# Patient Record
Sex: Male | Born: 1940 | Race: White | Hispanic: No | Marital: Married | Smoking: Former smoker
Health system: Southern US, Community
[De-identification: ages and names within clinical notes are randomized; demographics above are authoritative.]

## PROBLEM LIST (undated history)

## (undated) DIAGNOSIS — I340 Nonrheumatic mitral (valve) insufficiency: Secondary | ICD-10-CM

## (undated) DIAGNOSIS — I10 Essential (primary) hypertension: Secondary | ICD-10-CM

## (undated) DIAGNOSIS — E559 Vitamin D deficiency, unspecified: Secondary | ICD-10-CM

## (undated) DIAGNOSIS — K449 Diaphragmatic hernia without obstruction or gangrene: Secondary | ICD-10-CM

## (undated) DIAGNOSIS — R001 Bradycardia, unspecified: Secondary | ICD-10-CM

## (undated) DIAGNOSIS — Z85028 Personal history of other malignant neoplasm of stomach: Secondary | ICD-10-CM

## (undated) DIAGNOSIS — R42 Dizziness and giddiness: Secondary | ICD-10-CM

## (undated) DIAGNOSIS — K259 Gastric ulcer, unspecified as acute or chronic, without hemorrhage or perforation: Secondary | ICD-10-CM

## (undated) DIAGNOSIS — N529 Male erectile dysfunction, unspecified: Secondary | ICD-10-CM

## (undated) DIAGNOSIS — M8580 Other specified disorders of bone density and structure, unspecified site: Secondary | ICD-10-CM

## (undated) DIAGNOSIS — J449 Chronic obstructive pulmonary disease, unspecified: Secondary | ICD-10-CM

## (undated) DIAGNOSIS — C801 Malignant (primary) neoplasm, unspecified: Secondary | ICD-10-CM

## (undated) DIAGNOSIS — K219 Gastro-esophageal reflux disease without esophagitis: Secondary | ICD-10-CM

## (undated) HISTORY — DX: Gastro-esophageal reflux disease without esophagitis: K21.9

## (undated) HISTORY — DX: Chronic obstructive pulmonary disease, unspecified: J44.9

## (undated) HISTORY — DX: Diaphragmatic hernia without obstruction or gangrene: K44.9

## (undated) HISTORY — DX: Vitamin D deficiency, unspecified: E55.9

## (undated) HISTORY — DX: Dizziness and giddiness: R42

## (undated) HISTORY — DX: Gastric ulcer, unspecified as acute or chronic, without hemorrhage or perforation: K25.9

## (undated) HISTORY — DX: Male erectile dysfunction, unspecified: N52.9

## (undated) HISTORY — DX: Personal history of other malignant neoplasm of stomach: Z85.028

## (undated) HISTORY — DX: Essential (primary) hypertension: I10

## (undated) HISTORY — DX: Other specified disorders of bone density and structure, unspecified site: M85.80

## (undated) HISTORY — DX: Nonrheumatic mitral (valve) insufficiency: I34.0

## (undated) HISTORY — DX: Malignant (primary) neoplasm, unspecified: C80.1

## (undated) HISTORY — DX: Bradycardia, unspecified: R00.1

---

## 1993-06-21 HISTORY — PX: CHOLECYSTECTOMY: SHX55

## 1998-01-21 ENCOUNTER — Ambulatory Visit (HOSPITAL_COMMUNITY): Admission: RE | Admit: 1998-01-21 | Discharge: 1998-01-21 | Payer: Self-pay | Admitting: Gastroenterology

## 1999-09-01 ENCOUNTER — Ambulatory Visit (HOSPITAL_COMMUNITY): Admission: RE | Admit: 1999-09-01 | Discharge: 1999-09-01 | Payer: Self-pay | Admitting: Family Medicine

## 1999-09-01 ENCOUNTER — Encounter: Payer: Self-pay | Admitting: Family Medicine

## 1999-09-20 HISTORY — PX: OTHER SURGICAL HISTORY: SHX169

## 2002-08-14 ENCOUNTER — Encounter: Payer: Self-pay | Admitting: Emergency Medicine

## 2002-08-14 ENCOUNTER — Emergency Department (HOSPITAL_COMMUNITY): Admission: EM | Admit: 2002-08-14 | Discharge: 2002-08-14 | Payer: Self-pay | Admitting: Emergency Medicine

## 2002-08-17 ENCOUNTER — Ambulatory Visit (HOSPITAL_BASED_OUTPATIENT_CLINIC_OR_DEPARTMENT_OTHER): Admission: RE | Admit: 2002-08-17 | Discharge: 2002-08-17 | Payer: Self-pay | Admitting: Orthopedic Surgery

## 2005-05-27 ENCOUNTER — Encounter
Admission: RE | Admit: 2005-05-27 | Discharge: 2005-07-08 | Payer: Self-pay | Admitting: Physical Medicine and Rehabilitation

## 2006-01-05 ENCOUNTER — Ambulatory Visit: Payer: Self-pay | Admitting: Internal Medicine

## 2006-01-31 ENCOUNTER — Encounter (INDEPENDENT_AMBULATORY_CARE_PROVIDER_SITE_OTHER): Payer: Self-pay | Admitting: *Deleted

## 2006-01-31 ENCOUNTER — Ambulatory Visit: Payer: Self-pay | Admitting: Internal Medicine

## 2006-01-31 ENCOUNTER — Ambulatory Visit (HOSPITAL_COMMUNITY): Admission: RE | Admit: 2006-01-31 | Discharge: 2006-01-31 | Payer: Self-pay | Admitting: *Deleted

## 2006-03-07 ENCOUNTER — Ambulatory Visit (HOSPITAL_COMMUNITY): Admission: RE | Admit: 2006-03-07 | Discharge: 2006-03-07 | Payer: Self-pay | Admitting: Internal Medicine

## 2006-06-10 ENCOUNTER — Ambulatory Visit (HOSPITAL_COMMUNITY): Admission: RE | Admit: 2006-06-10 | Discharge: 2006-06-10 | Payer: Self-pay

## 2006-08-21 LAB — HM COLONOSCOPY

## 2009-07-17 ENCOUNTER — Encounter: Payer: Self-pay | Admitting: Cardiology

## 2009-07-21 ENCOUNTER — Ambulatory Visit: Payer: Self-pay | Admitting: Cardiology

## 2009-07-21 DIAGNOSIS — I10 Essential (primary) hypertension: Secondary | ICD-10-CM | POA: Insufficient documentation

## 2009-07-21 DIAGNOSIS — R0602 Shortness of breath: Secondary | ICD-10-CM

## 2009-07-21 DIAGNOSIS — R001 Bradycardia, unspecified: Secondary | ICD-10-CM | POA: Insufficient documentation

## 2009-08-04 ENCOUNTER — Encounter: Payer: Self-pay | Admitting: Cardiology

## 2009-08-04 ENCOUNTER — Telehealth: Payer: Self-pay | Admitting: Cardiology

## 2009-08-05 ENCOUNTER — Encounter: Payer: Self-pay | Admitting: Cardiology

## 2009-08-05 ENCOUNTER — Ambulatory Visit: Payer: Self-pay | Admitting: Cardiology

## 2009-08-05 ENCOUNTER — Ambulatory Visit: Payer: Self-pay

## 2009-08-05 ENCOUNTER — Ambulatory Visit (HOSPITAL_COMMUNITY): Admission: RE | Admit: 2009-08-05 | Discharge: 2009-08-05 | Payer: Self-pay | Admitting: Cardiology

## 2009-08-18 ENCOUNTER — Encounter: Payer: Self-pay | Admitting: Cardiology

## 2009-08-19 ENCOUNTER — Ambulatory Visit: Payer: Self-pay | Admitting: Cardiology

## 2009-08-21 ENCOUNTER — Telehealth: Payer: Self-pay | Admitting: Cardiology

## 2009-09-02 ENCOUNTER — Telehealth: Payer: Self-pay | Admitting: Cardiology

## 2009-09-03 ENCOUNTER — Encounter: Payer: Self-pay | Admitting: Cardiology

## 2010-07-21 NOTE — Assessment & Plan Note (Signed)
Summary: per check out/saf   Visit Type:  Follow-up Primary Provider:  Dr Koleen Nimrod  CC:  no cardiac complains.  History of Present Illness: 70 yo with history of COPD and bradycardia returns for followup.  Patient has been told that his heart rate was slow as long ago as 2001 when he was hospitalized for stomach cancer surgery.  At that time, they told him his heart rate went down to 40 at night.  Patient was seen recently by Paulita Cradle at [facility].  ECG at that time showed sinus bradycardia at about 40 bpm (readout says junctional bradycardia but I can see P waves).  Today, HR is 53.  Patient has never had lightheadedness/orthostasis or syncope.  He does get vertigo-type symptoms occasionally that resolve with meclizine.  He is very active.  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  No chest pain.  No exertional shortness of breath with his normal daily activities.  He gets some mild shortness of breath walking up a steep hill but this has been chronic for years and attributed to his COPD.  BP is high today but has been ok at home.  He has GERD that manifests as belching and acid taste in his mouth.   We did a holter monitor last month that showed heart rate ranging from 41-125, average 65, with no pauses.  Echo was done showing normal LV systolic function but mild to moderate RV dilation and mild to moderate RV dysfunction.  This was thought to be related to his COPD.  We did check his oxygen saturation at rest (98%) and with ambulation (93%).  Patient brings home blood pressures. Systolics range mainly in the 130s to about 150.  BP is 168/88 today.    Current Medications (verified): 1)  Androgel Pump 1 % Gel (Testosterone) .... Daily 2)  Multivitamins   Tabs (Multiple Vitamin) .... Daily 3)  Calcium Carbonate-Vitamin D 600-400 Mg-Unit  Tabs (Calcium Carbonate-Vitamin D) .... Take 1 Tablet By  Mouth Two Times A Day 4)  Meclizine Hcl 12.5 Mg Tabs (Meclizine Hcl) .... As Needed 5)  Aspirin 81 Mg Tbec (Aspirin) .... Take One Tablet By Mouth Daily 6)  Flovent Hfa 44 Mcg/act Aero (Fluticasone Propionate  Hfa) .... Once A Day 7)  Dexilant 60 Mg Cpdr (Dexlansoprazole) .... As Needed 8)  Meloxicam 15 Mg Tabs (Meloxicam) .... As Needed  Allergies: 1)  ! * Z Pack 2)  ! * Pain Meds  Past History:  Past Medical History: 1. Bradycardia: Asymptomatic.  Holter (2/11) with PACs, PVCs, 1 run of 5 beats NSVT, HR range 41-125 with no pauses.  2. GERD 3. h/o stomach cancer, s/p surgery in 2001 4. COPD 5. vertigo 6. low testosterone 7. HTN 8. Echo (2/11): EF 60%, no regional wall motion abnormalities, mild MR, mild to moderate RV dilation, mild to moderate RV dysfunction.   Family History: Reviewed history from 07/21/2009 and no changes required. Mother with MI at 47, father with CVA  Social History: Reviewed history from 07/21/2009 and no changes required. Tobacco Use - Former. -quit 2000 Married, lives in [place] Retired farmer, does some carpentry still  Review of Systems       All systems reviewed and negative except as per HPI.   Vital Signs:  Patient profile:   70 year old male Height:      76  inches Weight:      171.50 pounds BMI:     24.01 Pulse rate:   52 / minute Pulse rhythm:   regular Resp:     18 per minute BP sitting:   168 / 88  (left arm) Cuff size:   large  Vitals Entered By: Vikki Ports (August 19, 2009 9:49 AM)  O2 Flow:  Room air CC: no cardiac complains Comments Sp02 98% resting and 93% walking Hear rate 66 after walking   Physical Exam  General:  Well developed, well nourished, in no acute distress. Neck:  Neck supple, no JVD. No masses, thyromegaly or abnormal cervical nodes. Lungs:  Clear bilaterally to auscultation and percussion. Heart:  Non-displaced PMI, chest non-tender; regular rate and rhythm, S1, S2 without murmurs, rubs or  gallops. Carotid upstroke normal, no bruit. Pedals normal pulses. No edema, no varicosities. Abdomen:  Bowel sounds positive; abdomen soft and non-tender without masses, organomegaly, or hernias noted. No hepatosplenomegaly. Extremities:  No clubbing or cyanosis. Neurologic:  Alert and oriented x 3. Psych:  Normal affect.   Impression & Recommendations:  Problem # 1:  BRADYCARDIA (ICD-427.89) Asymptomatic.  Has been present for years.  HR in the 40s at the lowest with no long pauses or anything else worrisome on holter.  Would avoid nodal blocking agents.   Problem # 2:  HYPERTENSION, UNSPECIFIED (ICD-401.9) BP is high. Start chlorthalidone 12.5 mg daily with 20 mEq KCl daily.  BMET in 2 wks and BP check.   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  This is most likely related to his COPD.  He is minimally symptomatic and is not hypoxic.  No specific therapy at this time.   Patient Instructions: 1)  Your physician has recommended you make the following change in your medication:  2)  Start Chlorthalidone 12.5mg  daily--this will be one-half of a 25mg  tablet 3)  Start KCL(potassium) daily 4)  Your physician recommends that you have lab work in: 2weeks--you can get this at [facility]--you have the order--please ask them to fax the result to Dr Alexas Basulto--[phone] 5)  Take and record your blood pressure---I will call you in 2 weeks to get the readings Luana Shu  6)  Your physician recommends that you schedule a follow-up appointment as needed with Dr Shirlee Latch. Prescriptions: POTASSIUM CHLORIDE CRYS CR 20 MEQ CR-TABS (POTASSIUM CHLORIDE CRYS CR) Take one tablet by mouth daily  #30 x 6   Entered by:   Katina Dung, RN, BSN   Authorized by:   Marca Ancona, MD   Signed by:   Katina Dung, RN, BSN on 08/19/2009   Method used:   Electronically to        [facility] (retail)       [address]       [place], [place]   [address]       Ph: [phone]       Fax: [phone]   RxID:   [id number] CHLORTHALIDONE 25 MG TABS (CHLORTHALIDONE) one- half tablet  daily  #30 x 6   Entered by:   Katina Dung, RN, BSN   Authorized by:   Marca Ancona, MD   Signed by:   Katina Dung, RN, BSN on 08/19/2009   Method used:   Electronically to        [facility]  [address] [facility] (retail)       [id number]       [place], [place]  [address]       Ph: [phone]       Fax: [phone]   RxID:   [id number]

## 2010-07-21 NOTE — Progress Notes (Signed)
Summary: At Home Vitals  At Home Vitals   Imported By: Marylou Mccoy 10/07/2009 12:34:51  _____________________________________________________________________  External Attachment:    Type:   Image     Comment:   External Document

## 2010-07-21 NOTE — Progress Notes (Signed)
Summary: MEDICATION MAKING PT FEEL NAUSEA   Phone Note Call from Patient Call back at Home Phone [phone]   Caller: Patient Summary of Call: PT TAKING KLOR-CON AND CHLORTHALID MAKING PT NAUSEA. Initial call taken by: Judie Grieve,  August 21, 2009 4:10 PM  Follow-up for Phone Call        Called patient...he states that he started Chlorthalidone and KCL yesterday and was nauseated for several hours after...and today the same thing happened but lasted longer. He states that he did eat prior to taking medication. Advised him to hold meds in the am and we will discuss intolerance to medication with Dr.Rut Betterton and call him back tomorrow. He agreed to this plan. Follow-up by: J REISS RN     Appended Document: MEDICATION MAKING PT FEEL NAUSEA  Try taking medications with meal.  If still symptomatic will change medications.   Appended Document: MEDICATION MAKING PT FEEL NAUSEA  discussed with pt--he states he has been taking with meals and is still nauseated--will forward to Dr Shirlee Latch for review  Appended Document: MEDICATION MAKING PT FEEL NAUSEA  reviewed with Dr Rodnisha Blomgren--recommended pt D/C Chlorthalidone and KCL and start Lisinopril 10mg  daily--be sure pt has BMP check in about 2 weeks--LMVM for pt  Appended Document: MEDICATION MAKING PT FEEL NAUSEA  LMVM  Appended Document: MEDICATION MAKING PT FEEL NAUSEA  talked with patient--he will D/C chlorthalidone and KCL and start Lisinopril 10mg  daily-pt know to get BMP checked in 2 weeks-he has the order   Clinical Lists Changes  Medications: Removed medication of CHLORTHALIDONE 25 MG TABS (CHLORTHALIDONE) one- half tablet  daily Removed medication of POTASSIUM CHLORIDE CRYS CR 20 MEQ CR-TABS (POTASSIUM CHLORIDE CRYS CR) Take one tablet by mouth daily Added new medication of LISINOPRIL 10 MG TABS (LISINOPRIL) one tablet daily - Signed Rx of LISINOPRIL 10 MG TABS (LISINOPRIL) one tablet daily;  #30 x 2;  Signed;  Entered by: Katina Dung, RN, BSN;  Authorized by: Marca Ancona, MD;  Method used: Electronically to [facility] [address], [place], [place], [place]  [address], Ph: [phone], Fax: [phone]    Prescriptions: LISINOPRIL 10 MG TABS (LISINOPRIL) one tablet daily  #30 x 2   Entered by:   Katina Dung, RN, BSN   Authorized by:   Marca Ancona, MD   Signed by:   Katina Dung, RN, BSN on 08/22/2009   Method used:   Electronically to        [facility] (retail)       [address]       [place], [place]  [address]       Ph: [phone]       Fax: [phone]   RxID:   [id number]

## 2010-07-21 NOTE — Procedures (Signed)
Summary: SUMMARY REPORT  SUMMARY REPORT   Imported By: Mirna Mires 08/11/2009 14:44:15  _____________________________________________________________________  External Attachment:    Type:   Image     Comment:   External Document

## 2010-07-21 NOTE — Assessment & Plan Note (Signed)
Summary: Patrick Novak/bradycardia/lightheaded   Visit Type:  Initial Consult Primary Provider:  Dr Koleen Nimrod  CC:  slow heart rate-pt does have dizziness pt he states he does not feel like he is going to pass out. Sob with exer.[name]  History of Present Illness: 70 yo with history of COPD and bradycardia presents for evaluation of bradycardia.  Patient states that he was actually told his heart rate was slow as long ago as 2001 when he was hospitalized for stomach cancer surgery.  At that time, they told him his heart rate went down to 40 at night.  Patient was seen recently by Paulita Cradle at [facility].  ECG at that time showed sinus bradycardia at about 40 bpm (readout says junctional bradycardia but I can see P waves).  Today, HR is 53.  Patient has never had lightheadedness/orthostasis or syncope.  He does get vertigo-type symptoms occasionally that resolve with meclizine.  He is very active.  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  No chest pain.  No exertional shortness of breath with his normal daily activities.  He gets some mild shortness of breath walking up a steep hill but this has been chronic for years and attributed to his COPD.  BP is high today but has been ok at home.  He has GERD that manifests as belching and acid taste in his mouth.   ECG: sinus brady at 53, LAFB  Current Medications (verified): 1)  Androgel Pump 1 % Gel (Testosterone) .... Daily 2)  Multivitamins   Tabs (Multiple Vitamin) .... Daily 3)  Calcum and Vit D 600/400mg  .... 1 Tab Two Times A Day 4)  Meclizine Hcl 25 Mg Tabs (Meclizine Hcl) .... As Needed 5)  Aspirin 81 Mg Tbec (Aspirin) .... Take One Tablet By Mouth Daily 6)  Flovent .... Unsure of Dose and Directions 7)  Dexilant .... Ussure of Dose and Directions  Allergies (verified): 1)  ! * Z Pack 2)  ! * Pain Meds  Past History:  Past Medical History: 1.  Bradycardia 2. GERD 3. h/o stomach cancer, s/p surgery in 2001 4. COPD 5. vertigo 6. low testosterone  Family History: Mother with MI at 43, father with CVA  Social History: Tobacco Use - Former. 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   Vital Signs:  Patient profile:   70 year old male Height:      71 inches Weight:      173 pounds Pulse rate:   53 / minute BP sitting:   154 / 74  (left arm)  Vitals Entered By: Burnett Kanaris, CNA (July 21, 2009 8:56 AM)  Physical Exam  General:  Well developed, well nourished, in no acute distress. Neck:  Neck supple, no JVD. No masses, thyromegaly or abnormal cervical nodes. Lungs:  Clear bilaterally to auscultation and percussion. Heart:  Non-displaced PMI, chest non-tender; regular rate and rhythm, S1, S2 without murmurs, rubs or gallops. Carotid upstroke normal, no bruit. Pedals normal pulses. No edema, no varicosities. Abdomen:  Bowel sounds positive; abdomen soft and non-tender without masses, organomegaly, or hernias noted. No hepatosplenomegaly. Extremities:  No clubbing or cyanosis. Neurologic:  Alert and oriented x 3. Psych:  Normal affect.   Impression & Recommendations:  Problem # 1:  BRADYCARDIA (ICD-427.89) Patient had sinus bradycardia in the 40s at PCP, in the 50s here today.  Has history of sinus bradycardia back to 2001 at least.  He has not had any symptoms attributable to bradycardia (no lightheadedness or syncope).  I will have him do a 48 hour holter monitor to make sure that he does not have any worrisome pauses.   Problem # 2:  DYSPNEA (ICD-786.05) Dyspnea with heavy exertion.  Probably COPD but will get an echo.   Problem # 3:  HYPERTENSION, UNSPECIFIED (ICD-401.9) Patient will check BP at home and we will call in 2 wks to see what it is running.   Patient should take ASA 81 mg daily.    Other Orders: Echocardiogram (Echo) Holter Monitor (Holter Monitor)  Patient Instructions: 1)  Your physician has requested that you have an echocardiogram.  Echocardiography is a painless test that uses sound waves to create images of your heart. It provides your doctor with information about the size and shape of your heart and how well your heart's chambers and valves are working.  This procedure takes approximately one hour. There are no restrictions for this procedure. 2)  Your physician has recommended that you wear a holter monitor.  Holter monitors are medical devices that record the heart's electrical activity. Doctors most often use these monitors to diagnose arrhythmias. Arrhythmias are problems with the speed or rhythm of the heartbeat. The monitor is a small, portable device. You can wear one while you do your normal daily activities. This is usually used to diagnose what is causing palpitations/syncope (passing out). 3)  Your physician has recommended you make the following change in your medication:   Start Aspirin 81mg  daily--this should be buffered or coated 4)  Take and record your blood pressure--I will call you in 2 weeks to get the readings Luana Shu [phone]  5)  Your physician recommends that you schedule a follow-up appointment in: 1 month with Dr Shirlee Latch

## 2010-07-21 NOTE — Letter (Signed)
Summary: [facility]  [facility]   Imported By: Kassie Mends 09/19/2009 09:59:47  _____________________________________________________________________  External Attachment:    Type:   Image     Comment:   External Document

## 2010-07-21 NOTE — Progress Notes (Signed)
Summary: B/P readings  Phone Note Outgoing Call   Call placed by: Katina Dung, RN, BSN,  September 02, 2009 3:01 PM Call placed to: Patient Summary of Call: B/P readings  Follow-up for Phone Call        discussed with wife 08/22/09 117/57 65 08/23/09 127/67 61 08/24/09 121/68 58 08/25/09 136/71 75 08/26/09 159/62 57 08/27/09 136/68 63  08/28/09 119/61 54 08/29/09 130/57 50 08/30/09 133/61 51 08/31/09 130/67 52 09/01/09 124/62 51 09/02/09 125/60 52 pt will have BMP done at [facility] 09-03-09 pt feels good will forward to Dr Shirlee Latch for review       Appended Document: B/P readings good bp continue current therapy  Appended Document: B/P readings-review 09-02-09//BMP 09-03-09 [facility] discussed with wife by telephone

## 2010-07-21 NOTE — Progress Notes (Signed)
Summary: Patient's at Home Vitals  Patient's at Home Vitals   Imported By: Marylou Mccoy 08/05/2009 09:48:52  _____________________________________________________________________  External Attachment:    Type:   Image     Comment:   External Document  Appended Document: Patient's at Home Vitals BP borderline elevated.  Will discuss when returns to office.   Appended Document: Patient's at Home Vitals discussed with pt--OV with Dr Shirlee Latch 08-19-09

## 2010-07-21 NOTE — Progress Notes (Signed)
Summary: B/P readings  Phone Note Outgoing Call   Call placed by: Katina Dung, RN, BSN,  August 04, 2009 3:29 PM Call placed to: Patient  Follow-up for Phone Call        B/P readings--talked with wife --pt coming for testing 08-05-09--she will bring a list of B/P readings she has taken since 07-22-09 Katina Dung, RN, BSN  August 04, 2009 3:33 PM   received B/P readings from wife--they will be scanned into EMR this morning Katina Dung, RN, BSN  August 05, 2009 9:18 AM   Additional Follow-up for Phone Call Additional follow up Details #1::        home readings scanned into EMR--wil review with Dr Shirlee Latch

## 2010-11-06 NOTE — Op Note (Signed)
NAME:  Patrick Novak, STOUDT                           ACCOUNT NO.:  [id number]   MEDICAL RECORD NO.:  [id number]                   PATIENT TYPE:  AMB   LOCATION:  DSC                                  FACILITY:  [facility]   PHYSICIAN:  Cindee Salt, M.D.                    DATE OF BIRTH:  [birth year]   DATE OF PROCEDURE:  08/17/2002  DATE OF DISCHARGE:                                 OPERATIVE REPORT   PREOPERATIVE DIAGNOSIS:  Saw injury to left middle and ring fingers.   POSTOPERATIVE DIAGNOSIS:  Saw injury to left middle and ring fingers.   OPERATIONS:  1. Repair of flexor digitorum superficialis profundus of middle and ring     fingers.  2. Repair of radial digital nerve of middle and ring fingers.  3. Repair of ulnar digital nerve of middle and ring fingers.  4. Repair of ulnar digital artery of middle and ring fingers.   SURGEON:  Cindee Salt, M.D.   ANESTHESIA:  General.   HISTORY:  The patient is a 70 year old male who suffered a saw injury to the  proximal phalanges of his middle and ring fingers.  He is admitted for  repairs.   DESCRIPTION OF PROCEDURE:  The patient was brought to the operating room and  general endotracheal intubation anesthesia carried without difficulty.  He  was prepped and draped using Betadine scrub and solution in the supine  position with the left arm free.  The wounds were opened.  The lacerations  inspected and debrided.  The radial digital arteries were intact.  The  radial digital nerves were cut.  The flexor superficialis and profundus were  cut in each finger along with the ulnar neurovascular bundles.  The repairs  were performed first to the middle and then to the ring fingers.  By  isolating the profundus tendon, the wounds were extended proximally over the  proximal phalanges, carried down through subcutaneous tissues.  The flexor  profundus was then delivered distally and pinned with a 25 gauge needle.  A  repair was then performed using a  modified Kessler using 3-0 Ti-Cron  sutures.  The superficialis was repaired after an epineural repair was  performed again using the 3-0 Ti-Cron.  The superficialis was in the chiasm  and repaired with horizontal mattress 3-0 Ti-Cron sutures.  The ring finger  was similarly repaired, both superficialis and profundus.  This allowed the  finger to lie in normal position.  The operative microscope was brought into  position.  The ends of the arteries and nerves were each sequentially done  from the radial side of the middle finger first.  The digital nerve was  repaired after cutting back to normal vesicles.  The repair was performed  with interrupted 9-0 nylon sutures aligning vesicles.  The digital artery  was then repaired on the ulnar aspect.  This was irrigated, dilated,  and  clot removed and repair performed with a back wall first technique with  interrupted 9-0 nylon sutures.  The digital nerve was then repaired after  again cutting back to normal vesicles with an epineural repair.  The radial  digital nerve on the middle was repaired with an epineural repair.  The  ulnar digital artery was repaired after cutting back to normal intima.  Irrigation, dilation, and a back wall first technique used.  The nerve was  then repaired with an epineural repair again using 9-0 nylon.  The wounds  were irrigated.  A digital block was given with 0.25% Marcaine to each  finger.  The wounds were closed with interrupted 5-0 nylon sutures.  A  sterile compressive dressing and splint were applied.  The patient tolerated  the procedure well.  The splint maintained the fingers in a flexed position  along with slight flexion of the wrist.  The patient was taken to the  recovery room for observation in satisfactory condition.  On deflation of  the tourniquet, the fingers immediately pinked.  He was discharged home to  return to the [facility] in one week on Talwin and Keflex.                                                Cindee Salt, M.D.    Angelique Blonder  D:  08/17/2002  T:  08/17/2002  Job:  [id number]

## 2010-11-06 NOTE — Op Note (Signed)
NAME:  Patrick Novak, Patrick Novak                 ACCOUNT NO.:  [id number]   MEDICAL RECORD NO.:  [id number]          PATIENT TYPE:  AMB   LOCATION:  DAY                           FACILITY:  [facility]   PHYSICIAN:  Lionel December, M.D.    DATE OF BIRTH:  [birth year]   DATE OF PROCEDURE:  01/31/2006  DATE OF DISCHARGE:                                 OPERATIVE REPORT   PROCEDURE:  Esophagogastroduodenoscopy followed by colonoscopy, which was  incomplete.   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  He now presents  with nausea of a few months' duration and recent episodes of hematochezia.  He is undergoing a diagnostic evaluation.  The patient recalls that his last  colonoscopy was in 1995 in [place], which was an incomplete exam,  followed by a barium enema.   Procedure and risks were reviewed the patient and informed consent was  obtained.   MEDICATIONS FOR CONSCIOUS SEDATION:  Benzocaine spray for pharyngeal topical  anesthesia, Demerol 50 mg IV, Versed 5 mg IV, atropine 0.3 mg IV.   FINDINGS:  Procedure performed in endoscopy suite.  The patient's vital  signs and O2 saturation were monitored during the procedure and remained  stable.  His blood pressure and O2 saturation remained normal.  He had  asymptomatic bradycardia with heart rate dropping to around 43, for which he  was given atropine.   PROCEDURE #1:  Esophagogastroduodenoscopy.   The patient was placed in the left lateral recumbent position and the  Olympus video scope was passed via oropharynx without any difficulty into  esophagus.   Esophagus:  The mucosa of the esophagus was normal.  GE junction was at 38  cm and hiatus at 40.   Stomach:  A fairly large pouch with a patent gastrojejunostomy.  Both the  afferent and efferent loops were examined and were normal.   Afferent loop  was a short with blunt ends with a patent gastrojejunostomy.  Mucosa of  gastric body, fundus and cardia was normal.  Scope was easily retroflexed to  examine fundus and cardia.   Small bowel:  Both afferent and efferent loops were examined and mucosa and  folds were normal.  Afferent loop was small (blind pouch).   Endoscope was withdrawn.  The patient prepared for procedure #2.   PROCEDURE #2:  Colonoscopy.   Rectal examination performed.  No abnormality noted on external or digital  exam.  The Olympus video scope was placed in the rectum and advanced under  vision into sigmoid colon.  Sigmoid colon was very noncompliant and  tortuous.  Scope could never be kept straight.  Using the loop, the scope  was passed into descending colon and splenic flexure.  The patient was  turned on his back and then on the right side and scope was passed into what  was felt to be the proximal to midtransverse colon.  However, loop could  never be completely reduced.  Therefore, examination  was incomplete.  Mucosa  of the distal half of the transverse colon, descending and sigmoid colon was  normal.  The rectal mucosa was normal except single a small polyp, which was  ablated via cold biopsy.  Scope was retroflexed to examine anorectal  junction and small hemorrhoids were noted below the dentate line.  Endoscope  was then withdrawn.  The patient tolerated the procedure well.   FINAL DIAGNOSES:  1. Small sliding hiatal hernia.  2. Patent gastrojejunostomy without any ulceration or stricture.  Large      gastric pouch without any abnormalities.  3. Incomplete colonoscopy to mid or proximal transverse colon.  He has      very noncompliant, tortuous sigmoid colon, limiting  exam.  4. Small rectal polyp, which was ablated via cold biopsy.  5. External hemorrhoids.   RECOMMENDATIONS:  1. He will continue with Carafate liquid, which has helped him a great      deal.  He will 2 g at bedtime  and 1 g as soon as he wakes up in the      morning.  2. I will be contacting the patient with biopsy results.  If he is      agreeable, we will bring him back in 3-4 weeks for a barium enema.      Lionel December, M.D.  Electronically Signed     NR/MEDQ  D:  01/31/2006  T:  02/01/2006  Job:  [id number]   cc:   Ernestina Penna, M.D.  Fax: [phone]

## 2010-11-06 NOTE — H&P (Signed)
[name], Patrick Novak                 ACCOUNT NO.:  [id number]   MEDICAL RECORD NO.:  [id number]          PATIENT TYPE:  AMB   LOCATION:                                FACILITY:  [facility]   PHYSICIAN:  Lionel December, M.D.    DATE OF BIRTH:  1941/02/13   DATE OF ADMISSION:  01/05/2006  DATE OF DISCHARGE:  LH                                HISTORY & PHYSICAL   PRESENTING COMPLAINT:  1.  Nausea of a few months duration.  2.  Progressive weakness.  3.  Hematochezia one week ago.   HISTORY OF PRESENT ILLNESS:  Patrick Novak is 70 year old Caucasian male patient of  Dr. Cherre Blanc who is here for a scheduled visit.  He is well known to me  from previous evaluation back in March 2001 when he was diagnosed with  malignant gastric ulcer.  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  He received six cycles of Cytoxan, Adriamycin,  vincristine, and prednisone, and has remained free of disease.  His last EGD  was unremarkable performed at [facility] in June 2003.  According to Dr. Hezzie Bump  note, his CT was three years ago.  He remains in remission.   The patient states that he has not felt well for the past few months.  On  most days, he wakes up with nausea.  He eats five to six small meals a day  and with some of these meals, he will start the heaving and gagging but has  not had a single episode of vomiting.  At times he has taken meclizine which  seemed to help.  However, he does not associate his nausea with HIGO.  He  denies abdominal pain, dysphagia, heartburn.  His appetite is fair.  He also  complains of progressive weakness over the last several months and he feels  tired all the time.  He has not had any fever, chills or night sweats.  He  also denies chest pain.  The only time he gets some dyspnea is if he goes up  a hill.  When asked if he was depressed, his wife states that sometimes he  does say  that he is depressed because some of the people he has known have  passed away.  He has also been having neck pain.  He had a shot back in  February which helps.  One week ago, he passed a moderate amount of bright  red blood with his bowel movement.  His stools generally have been soft  since his gastric surgery and he may have anywhere from one to three per day  and occasionally may skip a day.  He recalls his last colonoscopy was by Dr.  Matthias Hughs back in 1995.  From the description, it appears it was an incomplete  exam and he had a barium enema.  At that time, he also had EGD.   MEDICATIONS:  He is on meclizine 12.5 mg q.6h. p.r.n., Pepcid AC p.r.n.   He does  not take any OTC NSAIDs.   PAST MEDICAL HISTORY:  1.  History of gastric lymphoma, as above.  2.  History of labyrinthitis.  3.  He had cholecystectomy back in 1995.   ALLERGIES:  He is sensitive to narcotics.   FAMILY HISTORY:  Mother died of MI at age 60.  Father had many CVAs and died  at age 75.  He has five sisters in good health (ages 23-82).  He has one  brother living who is also in good health.  One brother died of CVA at age  88.   SOCIAL HISTORY:  He is married.  He has four children in good health.  He is  a retired Visual merchandiser.  He smoked 1 to 1 1/2 packs a day for several years but  quit six years ago.  He has never drank alcohol.   PHYSICAL EXAMINATION:  VITAL SIGNS:  Weight 171 pounds which is close to his weight in March 2001.  He is 5 feet 7 inches tall.  Pulse 60 per minute, blood pressure 120/70,  temperature is 98.  HEENT:  Conjunctivae is pink.  Sclerae is nonicteric.  Oral pharyngeal  mucosa is normal.  He has upper dentures in place and dentures in lower jaw.  NECK:  No neck masses or thyromegaly noted.  No axillary or inguinal  adenopathy, either.  No carotid bruits are noted.  CARDIAC:  Exam with regular rhythm.  Normal S1 and S2.  No murmur or gallop  noted.  LUNGS:  Clear to auscultation.   ABDOMEN:  His abdomen is flat.  Bowel sounds are normal on palpation.  It is  soft and nontender without organomegaly or masses.  RECTAL:  Examination  reveals non-nodular prostate and stool is guaiac negative.  EXTREMITIES:  No peripheral edema or clubbing noted.   LABORATORY DATA:  Labs from [facility] performed Nov 08, 2005, WBC 7.3, H&H 15.6  and 43.8, platelet count 345.  His LDH was 161.  PSA 0.7.  Sed rate was 4.  Thyroid panel from November 22, 2005, TSH 3.501, FTI 2.8, T4 9.9, and T3 uptake  is 28.  CBC and chemistry panel from April 05, 2005, was all normal.   ASSESSMENT:  Sargon is a 70 year old Caucasian male with history of gastric  lymphoma who is status post radical subtotal gastrectomy and six cycles of  chemotherapy who remains in remission who presents with a few months history  of nausea, postprandial gagging, progressive weakness, and a single episode  of hematochezia last week.  As far as his nausea is concerned, it could be  due to bile reflux, anastomotic stricture, or even central due to  depression.   Progressive weakness.  Recent lab studies have been normal including TSH and  LDH.  His H&H have also been normal.  So there is no obvious etiology.  B12  level needs to be checked given that he has had near total gastrectomy.   Recent episode of hematochezia.  This may well be hemorrhoidal, need to make  sure he does not have colonic neoplasm, his last colonoscopy was over ten  years ago.   RECOMMENDATIONS:  1.  Will check serum B12 level.  2.  Empiric trial with Carafate liquid 2 grams at bedtime and he can also      take 1 gram as soon as he wakes up.  3.  Diagnostic esophagogastroduodenoscopy followed by colonoscopy to be      performed at the [facility] in the near  future.  I have reviewed the procedure      and risks with the patient and his wife and they are agreeable.   Further recommendations will depend on results of these studies.     Lionel December, M.D.   Electronically Signed     NR/MEDQ  D:  01/05/2006  T:  01/05/2006  Job:  [id number]   cc:   Ernestina Penna, M.D.  Fax: [phone]   Jaclyn Prime. Greggory Stallion, M.D.  Fax: [phone]

## 2010-12-22 ENCOUNTER — Encounter: Payer: Self-pay | Admitting: Cardiology

## 2011-11-18 LAB — FECAL OCCULT BLOOD, GUAIAC: Fecal Occult Blood: NEGATIVE

## 2012-02-09 LAB — HM DEXA SCAN

## 2012-08-20 ENCOUNTER — Encounter: Payer: Self-pay | Admitting: Family Medicine

## 2012-08-20 DIAGNOSIS — K449 Diaphragmatic hernia without obstruction or gangrene: Secondary | ICD-10-CM

## 2012-08-20 DIAGNOSIS — M8580 Other specified disorders of bone density and structure, unspecified site: Secondary | ICD-10-CM | POA: Insufficient documentation

## 2012-08-20 DIAGNOSIS — N529 Male erectile dysfunction, unspecified: Secondary | ICD-10-CM

## 2012-08-20 DIAGNOSIS — R7989 Other specified abnormal findings of blood chemistry: Secondary | ICD-10-CM | POA: Insufficient documentation

## 2012-09-08 ENCOUNTER — Ambulatory Visit (INDEPENDENT_AMBULATORY_CARE_PROVIDER_SITE_OTHER): Payer: Medicare Other | Admitting: Family Medicine

## 2012-09-08 ENCOUNTER — Encounter: Payer: Self-pay | Admitting: Family Medicine

## 2012-09-08 VITALS — BP 128/65 | HR 58 | Temp 97.4°F | Ht 71.0 in | Wt 172.4 lb

## 2012-09-08 DIAGNOSIS — I10 Essential (primary) hypertension: Secondary | ICD-10-CM

## 2012-09-08 DIAGNOSIS — M8580 Other specified disorders of bone density and structure, unspecified site: Secondary | ICD-10-CM

## 2012-09-08 DIAGNOSIS — M899 Disorder of bone, unspecified: Secondary | ICD-10-CM

## 2012-09-08 DIAGNOSIS — E291 Testicular hypofunction: Secondary | ICD-10-CM

## 2012-09-08 DIAGNOSIS — Z5181 Encounter for therapeutic drug level monitoring: Secondary | ICD-10-CM

## 2012-09-08 DIAGNOSIS — R7989 Other specified abnormal findings of blood chemistry: Secondary | ICD-10-CM

## 2012-09-08 DIAGNOSIS — J441 Chronic obstructive pulmonary disease with (acute) exacerbation: Secondary | ICD-10-CM

## 2012-09-08 DIAGNOSIS — E559 Vitamin D deficiency, unspecified: Secondary | ICD-10-CM

## 2012-09-08 DIAGNOSIS — M949 Disorder of cartilage, unspecified: Secondary | ICD-10-CM

## 2012-09-08 LAB — POCT CBC
Granulocyte percent: 60.6 %G (ref 37–80)
HCT, POC: 43.5 % (ref 43.5–53.7)
Hemoglobin: 15.8 g/dL (ref 14.1–18.1)
Lymph, poc: 2.3 (ref 0.6–3.4)
MCH, POC: 33.4 pg — AB (ref 27–31.2)
MCHC: 36.3 g/dL — AB (ref 31.8–35.4)
MCV: 92 fL (ref 80–97)
POC Granulocyte: 4.1 (ref 2–6.9)
POC LYMPH PERCENT: 34.1 %L (ref 10–50)
Platelet Count, POC: 250 10*3/uL (ref 142–424)
RBC: 4.7 M/uL (ref 4.69–6.13)
RDW, POC: 13.8 %
WBC: 6.7 10*3/uL (ref 4.6–10.2)

## 2012-09-08 LAB — HEPATIC FUNCTION PANEL
ALT: 14 U/L (ref 0–53)
AST: 17 U/L (ref 0–37)
Albumin: 3.9 g/dL (ref 3.5–5.2)
Alkaline Phosphatase: 100 U/L (ref 39–117)
Bilirubin, Direct: 0.2 mg/dL (ref 0.0–0.3)
Indirect Bilirubin: 0.7 mg/dL (ref 0.0–0.9)
Total Bilirubin: 0.9 mg/dL (ref 0.3–1.2)
Total Protein: 5.8 g/dL — ABNORMAL LOW (ref 6.0–8.3)

## 2012-09-08 LAB — BASIC METABOLIC PANEL
BUN: 14 mg/dL (ref 6–23)
CO2: 28 mEq/L (ref 19–32)
Calcium: 9.2 mg/dL (ref 8.4–10.5)
Chloride: 105 mEq/L (ref 96–112)
Creat: 1.09 mg/dL (ref 0.50–1.35)
Glucose, Bld: 84 mg/dL (ref 70–99)
Potassium: 4.4 mEq/L (ref 3.5–5.3)
Sodium: 140 mEq/L (ref 135–145)

## 2012-09-08 LAB — PSA, MEDICARE: PSA: 1.33 ng/mL (ref <=4.00)

## 2012-09-08 LAB — TESTOSTERONE: Testosterone: 602 ng/dL (ref 300–890)

## 2012-09-08 MED ORDER — LISINOPRIL 10 MG PO TABS: 10.0000 mg | ORAL_TABLET | Freq: Every day | ORAL | Status: DC

## 2012-09-08 MED ORDER — FLUTICASONE PROPIONATE HFA 44 MCG/ACT IN AERO: 1.0000 | INHALATION_SPRAY | Freq: Every day | RESPIRATORY_TRACT | Status: DC

## 2012-09-08 MED ORDER — TESTOSTERONE 20.25 MG/1.25GM (1.62%) TD GEL: 2.0000 | Freq: Every morning | TRANSDERMAL | Status: DC

## 2012-09-08 NOTE — Progress Notes (Signed)
Subjective:     Patient ID: Patrick Novak, male   DOB: 15-Jun-1941, 72 y.o.   MRN: [id number]  HPI Patient comes in to establish with a new provider here at [facility]. He is to see Lupita Leash.  a multitude of medical problems which are all stable at present which include a crit past history of stomach cancer status post gastrectomy. Large cell lymphoma. Osteopenia. Hypogonadism/low testosterone Hiatal hernia. COPD. Vitamin D deficiency. Osteopenia. Erectile dysfunction.  Past Medical History  Diagnosis Date  . Bradycardia     asympotmatic. Holter (2/11) with PACs, 1 run of 5 beats NSVT, HR range 41-125 with no pauses.   [name] GERD (gastroesophageal reflux disease)   . History of stomach cancer     surgery in 2001  . COPD (chronic obstructive pulmonary disease)   . Vertigo   . Low testosterone   . HTN (hypertension)   . MR (mitral regurgitation)     echo 2/11: EF 60%, no regional wall abnormalities, mild MR, mild to moderate RV dialation, mild to moderate RV dysfunction   . Cancer     gastric lymphoma  . Osteopenia   . Vitamin D deficiency   . Hiatal hernia   . Gastric ulcer   . ED (erectile dysfunction)   . Vertigo    Past Surgical History  Procedure Laterality Date  . Cholecystectomy  1995  . Radical subtotal gastrectomy  09/1999   History   Social History  . Marital Status: Married    Spouse Name: N/A    Number of Children: N/A  . Years of Education: N/A   Occupational History  . Not on file.   Social History Main Topics  . Smoking status: Former Smoker -- 1.50 packs/day    Types: Cigarettes    Quit date: 08/21/1998  . Smokeless tobacco: Not on file     Comment: quit in 2000  . Alcohol Use: No  . Drug Use: No  . Sexually Active: Not on file   Other Topics Concern  . Not on file   Social History Narrative   Married, lives in [place].   Retired Visual merchandiser, does some carpentry.    Family History  Problem Relation Age of Onset  . Stroke Father   . Heart disease  Mother   . Stroke Brother    Current Outpatient Prescriptions on File Prior to Visit  Medication Sig Dispense Refill  . aspirin 81 MG EC tablet Take 81 mg by mouth daily.        . Calcium Carbonate-Vitamin D 600-400 MG-UNIT per tablet Take 1 tablet by mouth 2 (two) times daily.        . meclizine (ANTIVERT) 12.5 MG tablet Take 12.5 mg by mouth as needed.        . meloxicam (MOBIC) 15 MG tablet Take 15 mg by mouth as needed.        . Multiple Vitamin (MULTIVITAMIN) tablet Take 1 tablet by mouth daily.        [name] dexlansoprazole (DEXILANT) 60 MG capsule Take 60 mg by mouth as needed.         No current facility-administered medications on file prior to visit.   Allergies  Allergen Reactions  . Ciprofloxacin Nausea Only  . Codeine   . Zithromax (Azithromycin)    Immunization History  Administered Date(s) Administered  . DTaP 07/23/2002  . Influenza Whole 03/22/2012  . Pneumococcal Polysaccharide 08/21/2006  . Zoster 08/20/2009   Prior to Admission medications   Medication  Sig Start Date End Date Taking? Authorizing Provider  aspirin 81 MG EC tablet Take 81 mg by mouth daily.     Yes Historical Provider, MD  Calcium Carbonate-Vitamin D 600-400 MG-UNIT per tablet Take 1 tablet by mouth 2 (two) times daily.     Yes Historical Provider, MD  fluticasone (FLOVENT HFA) 44 MCG/ACT inhaler Inhale 1 puff into the lungs daily. 09/08/12  Yes Ileana Ladd, MD  lisinopril (PRINIVIL,ZESTRIL) 10 MG tablet Take 1 tablet (10 mg total) by mouth daily. 09/08/12  Yes Ileana Ladd, MD  meclizine (ANTIVERT) 12.5 MG tablet Take 12.5 mg by mouth as needed.     Yes Historical Provider, MD  meloxicam (MOBIC) 15 MG tablet Take 15 mg by mouth as needed.     Yes Historical Provider, MD  Multiple Vitamin (MULTIVITAMIN) tablet Take 1 tablet by mouth daily.     Yes Historical Provider, MD  PROAIR HFA 108 (90 BASE) MCG/ACT inhaler  07/04/12  Yes Historical Provider, MD  dexlansoprazole (DEXILANT) 60 MG capsule Take  60 mg by mouth as needed.      Historical Provider, MD  promethazine (PHENERGAN) 12.5 MG tablet  07/17/12   Historical Provider, MD  Testosterone (ANDROGEL) 20.25 MG/1.25GM (1.62%) GEL Place 2 Squirts onto the skin every morning. 09/08/12   Ileana Ladd, MD     Review of Systems    systematic review was negative today Objective:   Physical Exam On examination he appeared in no acute distress. BP 128/65  Pulse 58  Temp(Src) 97.4 F (36.3 C) (Oral)  Ht 5\' 11"  (1.803 m)  Wt 172 lb 6.4 oz (78.2 kg)  BMI 24.06 kg/m2  Vital signs as documented.  Skin warm and dry and without overt rashes.  Head &Neck without JVD. Normal. No carotid bruits Lungs clear.  Heart exam notable for regular rhythm, normal sounds and absence of murmurs, rubs or gallops.  Abdomen unremarkable and without evidence of organomegaly, masses, or abdominal aortic enlargement. Midline abdominal scar from remote surgery Extremities nonedematous.    Assessment:     Osteopenia  Low testosterone - Plan: Testosterone  Unspecified vitamin D deficiency - Plan: Vitamin D, 25-hydroxy  Unspecified essential hypertension - Plan: Basic Metabolic Panel, POCT CBC  COPD exacerbation  Medication monitoring encounter - Plan: Hepatic Function Panel, PSA, Medicare, CANCELED: CBC with Differential  Today's blood pressure is well controlled. He is actually feeling better well and he came in for routine labs to follow on his medications PSA has been ordered due to the fact that these on testosterone he would like to go back to the AndroGel and stopped using the injection    Plan:     Orders Placed This Encounter  Procedures  . Hepatic Function Panel  . Basic Metabolic Panel  . Testosterone  . Vitamin D, 25-hydroxy  . PSA, Medicare  . POCT CBC   Diet and Exercise discussed with patient. For nutrition information, I recommended books: Eat to Live by Dr Monico Hoar. Prevent and Reverse Heart Disease by Dr Suzzette Righter.  Exercise recommendations are:  If unable to walk, then the patient can exercise in a chair 3 times a day. By flapping arms like a bird gently and raising legs outwards to the front.  If ambulatory, the patient can go for walks for 30 minutes 3 times a week. Then increase the intensity and duration as tolerated. Goal is to try to attain exercise frequency to 5 times a week. Best to perform  resistance exercises 2 days a week and cardio type exercises 3 days per week.  Remy Voiles P. Modesto Charon, M.D.

## 2012-09-09 LAB — VITAMIN D 25 HYDROXY (VIT D DEFICIENCY, FRACTURES): Vit D, 25-Hydroxy: 48 ng/mL (ref 30–89)

## 2012-09-11 ENCOUNTER — Other Ambulatory Visit: Payer: Self-pay

## 2012-09-11 MED ORDER — TESTOSTERONE 20.25 MG/1.25GM (1.62%) TD GEL: 1.0000 | Freq: Every morning | TRANSDERMAL | Status: DC

## 2012-09-11 NOTE — Progress Notes (Signed)
Quick Note:  Call patient. Labs normal. No change in plan. However we can reduce AndroGel pump to 1 squirt per day ______

## 2012-09-13 ENCOUNTER — Ambulatory Visit (INDEPENDENT_AMBULATORY_CARE_PROVIDER_SITE_OTHER): Payer: Medicare Other | Admitting: *Deleted

## 2012-09-13 DIAGNOSIS — E291 Testicular hypofunction: Secondary | ICD-10-CM

## 2012-09-13 DIAGNOSIS — R7989 Other specified abnormal findings of blood chemistry: Secondary | ICD-10-CM

## 2012-09-13 MED ORDER — TESTOSTERONE CYPIONATE 200 MG/ML IM SOLN
200.0000 mg | INTRAMUSCULAR | Status: DC
  Administered 2012-09-13: 200 mg via INTRAMUSCULAR

## 2012-09-13 NOTE — Patient Instructions (Signed)
Testosterone injection What is this medicine? TESTOSTERONE (tes TOS ter one) is the main male hormone. It supports normal male development such as muscle growth, facial hair, and deep voice. It is used in males to treat low testosterone levels. This medicine may be used for other purposes; ask your health care provider or pharmacist if you have questions. What should I tell my health care provider before I take this medicine? They need to know if you have any of these conditions: -breast cancer -diabetes -heart disease -kidney disease -liver disease -lung disease -prostate cancer, enlargement -an unusual or allergic reaction to testosterone, other medicines, foods, dyes, or preservatives -pregnant or trying to get pregnant -breast-feeding How should I use this medicine? This medicine is for injection into a muscle. It is usually given by a health care professional in a hospital or clinic setting. Contact your pediatrician regarding the use of this medicine in children. While this medicine may be prescribed for children as young as 12 years of age for selected conditions, precautions do apply. Overdosage: If you think you have taken too much of this medicine contact a poison control center or emergency room at once. NOTE: This medicine is only for you. Do not share this medicine with others. What if I miss a dose? Try not to miss a dose. Your doctor or health care professional will tell you when your next injection is due. Notify the office if you are unable to keep an appointment. What may interact with this medicine? -medicines for diabetes -medicines that treat or prevent blood clots like warfarin -oxyphenbutazone -propranolol -steroid medicines like prednisone or cortisone This list may not describe all possible interactions. Give your health care provider a list of all the medicines, herbs, non-prescription drugs, or dietary supplements you use. Also tell them if you smoke, drink  alcohol, or use illegal drugs. Some items may interact with your medicine. What should I watch for while using this medicine? Visit your doctor or health care professional for regular checks on your progress. They will need to check the level of testosterone in your blood. This medicine may affect blood sugar levels. If you have diabetes, check with your doctor or health care professional before you change your diet or the dose of your diabetic medicine. This drug is banned from use in athletes by most athletic organizations. What side effects may I notice from receiving this medicine? Side effects that you should report to your doctor or health care professional as soon as possible: -allergic reactions like skin rash, itching or hives, swelling of the face, lips, or tongue -breast enlargement -breathing problems -changes in mood, especially anger, depression, or rage -dark urine -general ill feeling or flu-like symptoms -light-colored stools -loss of appetite, nausea -nausea, vomiting -right upper belly pain -stomach pain -swelling of ankles -too frequent or persistent erections -trouble passing urine or change in the amount of urine -unusually weak or tired -yellowing of the eyes or skin Additional side effects that can occur in women include: -deep or hoarse voice -facial hair growth -irregular menstrual periods Side effects that usually do not require medical attention (report to your doctor or health care professional if they continue or are bothersome): -acne -change in sex drive or performance -hair loss -headache This list may not describe all possible side effects. Call your doctor for medical advice about side effects. You may report side effects to FDA at 1-800-FDA-1088. Where should I keep my medicine? Keep out of the reach of children. This medicine   can be abused. Keep your medicine in a safe place to protect it from theft. Do not share this medicine with anyone.  Selling or giving away this medicine is dangerous and against the law. Store at room temperature between 20 and 25 degrees C (68 and 77 degrees F). Do not freeze. Protect from light. Follow the directions for the product you are prescribed. Throw away any unused medicine after the expiration date. NOTE: This sheet is a summary. It may not cover all possible information. If you have questions about this medicine, talk to your doctor, pharmacist, or health care provider.  2012, Elsevier/Gold Standard. (08/18/2007 4:13:46 PM) 

## 2012-10-03 ENCOUNTER — Ambulatory Visit (INDEPENDENT_AMBULATORY_CARE_PROVIDER_SITE_OTHER): Payer: Medicare Other | Admitting: *Deleted

## 2012-10-03 DIAGNOSIS — E291 Testicular hypofunction: Secondary | ICD-10-CM

## 2012-10-03 MED ORDER — TESTOSTERONE CYPIONATE 200 MG/ML IM SOLN
200.0000 mg | INTRAMUSCULAR | Status: DC
  Administered 2012-10-03: 200 mg via INTRAMUSCULAR

## 2012-10-03 NOTE — Progress Notes (Signed)
Tolerated well

## 2012-10-13 ENCOUNTER — Ambulatory Visit: Payer: Medicare Other | Admitting: Family Medicine

## 2013-04-10 ENCOUNTER — Ambulatory Visit (INDEPENDENT_AMBULATORY_CARE_PROVIDER_SITE_OTHER): Payer: Medicare Other | Admitting: Family Medicine

## 2013-04-10 ENCOUNTER — Ambulatory Visit (INDEPENDENT_AMBULATORY_CARE_PROVIDER_SITE_OTHER): Payer: Medicare Other

## 2013-04-10 ENCOUNTER — Encounter: Payer: Self-pay | Admitting: Family Medicine

## 2013-04-10 VITALS — BP 145/91 | HR 68 | Temp 99.1°F | Resp 24 | Ht 71.0 in | Wt 169.0 lb

## 2013-04-10 DIAGNOSIS — J441 Chronic obstructive pulmonary disease with (acute) exacerbation: Secondary | ICD-10-CM

## 2013-04-10 DIAGNOSIS — J029 Acute pharyngitis, unspecified: Secondary | ICD-10-CM

## 2013-04-10 DIAGNOSIS — R05 Cough: Secondary | ICD-10-CM

## 2013-04-10 DIAGNOSIS — R062 Wheezing: Secondary | ICD-10-CM

## 2013-04-10 LAB — POCT CBC
Lymph, poc: 1.7 (ref 0.6–3.4)
MCH, POC: 31.7 pg — AB (ref 27–31.2)
MCHC: 33.5 g/dL (ref 31.8–35.4)
MCV: 94.9 fL (ref 80–97)
MPV: 6.5 fL (ref 0–99.8)
Platelet Count, POC: 281 10*3/uL (ref 142–424)
WBC: 11 10*3/uL — AB (ref 4.6–10.2)

## 2013-04-10 MED ORDER — PREDNISONE 50 MG PO TABS: ORAL_TABLET | ORAL | Status: DC

## 2013-04-10 MED ORDER — DOXYCYCLINE HYCLATE 100 MG PO CAPS: 100.0000 mg | ORAL_CAPSULE | Freq: Two times a day (BID) | ORAL | Status: DC

## 2013-04-10 MED ORDER — IPRATROPIUM-ALBUTEROL 0.5-2.5 (3) MG/3ML IN SOLN
3.0000 mL | RESPIRATORY_TRACT | Status: DC
  Administered 2013-04-10: 3 mL via RESPIRATORY_TRACT

## 2013-04-10 MED ORDER — SODIUM CHLORIDE 0.9 % IV SOLN: 40.0000 mg | Freq: Once | INTRAVENOUS | Status: DC

## 2013-04-10 MED ORDER — METHYLPREDNISOLONE SODIUM SUCC 40 MG IJ SOLR
40.0000 mg | Freq: Once | INTRAMUSCULAR | Status: AC
  Administered 2013-04-10: 40 mg via INTRAMUSCULAR

## 2013-04-10 NOTE — Addendum Note (Signed)
Addended by: Gwenith Daily on: 04/10/2013 03:53 PM   Modules accepted: Orders

## 2013-04-10 NOTE — Progress Notes (Signed)
  Subjective:    Patient ID: Patrick Novak, male    DOB: August 21, 1940, 72 y.o.   MRN: [id number]  HPI URI Symptoms Onset: 3-4 days  Description: rhinorrhe, nasal congestion, cough, wheezing  Modifying factors:  Baseline hx/o COPD, + wheezing, got flu shot last week   Symptoms Nasal discharge: yes Fever: no Sore throat: yes Cough: yes Wheezing: yes Ear pain: no GI symptoms: no Sick contacts: no  Red Flags  Stiff neck: no Dyspnea: mild Rash: no Swallowing difficulty: no  Sinusitis Risk Factors Headache/face pain: no Double sickening: no tooth pain: no  Allergy Risk Factors Sneezing: no Itchy scratchy throat: no Seasonal symptoms: no  Flu Risk Factors Headache: no muscle aches: no severe fatigue: no     Review of Systems  All other systems reviewed and are negative.       Objective:   Physical Exam  Constitutional:  Mildly ill appearing, minimal to mild increased worker breathing   HENT:  Head: Normocephalic and atraumatic.  Right Ear: External ear normal.  Left Ear: External ear normal.  +nasal erythema, rhinorrhea bilaterally, + post oropharyngeal erythema    Eyes: Conjunctivae are normal. Pupils are equal, round, and reactive to light.  Neck: Normal range of motion.  Cardiovascular: Normal rate and regular rhythm.   Pulmonary/Chest: He has wheezes. He has rales.  Mildly increased respiratory effort  Abdominal: Soft.  Musculoskeletal: Normal range of motion.  Neurological: He is alert.  Skin: Skin is warm.   [facility] reading (PRIMARY) by  Dr. Alvester Morin  Preliminarily with noted bronchitic changes without focal infiltrate. Formal read pending.                                          Assessment & Plan:  Cough - Plan: DG Chest 2 View, POCT CBC, POCT Influenza A/B  Sore throat - Plan: POCT rapid strep A, Culture, Group A Strep  Wheezing - Plan: ipratropium-albuterol (DUONEB) 0.5-2.5 (3) MG/3ML nebulizer solution 3 mL  COPD exacerbation - Plan:  ipratropium-albuterol (DUONEB) 0.5-2.5 (3) MG/3ML nebulizer solution 3 mL, methylPREDNISolone sodium succinate (SOLU-MEDROL) 40 mg in sodium chloride 0.9 % 50 mL IVPB, predniSONE (DELTASONE) 50 MG tablet, doxycycline (VIBRAMYCIN) 100 MG capsule  Patient with likely viral induced COPD exacerbation. Patient given Solu-Medrol 40 mg IM x1 (no more Solu-Medrol available in clinic) and DuoNeb treatment x1. Minimal improvement of symptoms with still persistent mild to moderate respiratory distress. Discussed plan of care with family. Plan send patient via EMS to the hospital for further evaluation. Supplemental oxygen placed.

## 2013-04-11 ENCOUNTER — Telehealth: Payer: Self-pay | Admitting: Family Medicine

## 2013-04-16 NOTE — Telephone Encounter (Signed)
Pt has nebulizer

## 2013-04-17 ENCOUNTER — Telehealth: Payer: Self-pay | Admitting: Family Medicine

## 2013-04-19 ENCOUNTER — Ambulatory Visit (INDEPENDENT_AMBULATORY_CARE_PROVIDER_SITE_OTHER): Payer: Medicare Other | Admitting: Family Medicine

## 2013-04-19 VITALS — BP 128/65 | HR 69 | Temp 96.6°F | Ht 71.0 in | Wt 170.0 lb

## 2013-04-19 DIAGNOSIS — J449 Chronic obstructive pulmonary disease, unspecified: Secondary | ICD-10-CM

## 2013-04-19 MED ORDER — FLUTICASONE-SALMETEROL 250-50 MCG/DOSE IN AEPB: 1.0000 | INHALATION_SPRAY | Freq: Two times a day (BID) | RESPIRATORY_TRACT | Status: DC

## 2013-04-19 NOTE — Progress Notes (Signed)
  Subjective:    Patient ID: Patrick Novak, male    DOB: 11-28-1940, 72 y.o.   MRN: [id number]  HPI Pt here for COPD follow up Pt was seen on 10/21 for COPD exacerbation.  Was sent to ER for persistent resp distress despite duoneb treatment and Im solumedrol.  Pt went to ER and was d/c'd home after CAT treatment and ambulation w/o dyspnea/desat per pt.  Has been on prednisone since ER d/c Stopped taking doxy 2/2 nausea. Took doxy for about 2 days.  No fever, resp distress, wheezing.  Sputum production has also greatly improved.  Feels like he is back to baseline.    Review of Systems  All other systems reviewed and are negative.       Objective:   Physical Exam  Constitutional: He appears well-developed and well-nourished.  HENT:  Head: Normocephalic and atraumatic.  Eyes: Conjunctivae are normal. Pupils are equal, round, and reactive to light.  Neck: Normal range of motion.  Cardiovascular: Normal rate and regular rhythm.   Pulmonary/Chest: Effort normal.  Trace wheezes in apices, no resp distress or increased WOB   Abdominal: Soft.  Musculoskeletal: Normal range of motion.  Neurological: He is alert.  Skin: Skin is warm.          Assessment & Plan:  COPD (chronic obstructive pulmonary disease) - Plan: Fluticasone-Salmeterol (ADVAIR DISKUS) 250-50 MCG/DOSE AEPB  COPD exacerbation clinically improved.  Will start on advair for long term tx (stop flovent).  Discussed infectious and resp red flags that would warrant reevaluation.  Follow up in 6-8 weeks for PFTs if not already done.

## 2013-04-19 NOTE — Patient Instructions (Signed)
Schedule appointment for tetanus and pneumonia shot once well.

## 2013-05-02 ENCOUNTER — Telehealth: Payer: Self-pay | Admitting: Family Medicine

## 2013-05-03 NOTE — Telephone Encounter (Signed)
[place] to address 

## 2013-05-05 NOTE — Telephone Encounter (Signed)
I am ok with authorization.  If there is paperwork that I need to fill out.  I can fill it back out on 11/18 when i am in the office.

## 2013-05-07 ENCOUNTER — Other Ambulatory Visit: Payer: Self-pay | Admitting: *Deleted

## 2013-05-07 DIAGNOSIS — J449 Chronic obstructive pulmonary disease, unspecified: Secondary | ICD-10-CM

## 2013-05-07 NOTE — Telephone Encounter (Signed)
Order placed in another encounter for a nebulizer. Patient should be able to turn in the hard copy of the order to his insurance company. Will have Dr. Alvester Morin sign it tomorrow.

## 2013-05-07 NOTE — Progress Notes (Signed)
Patient purchased a nebulizer machine and insurance will cover it if we authorize it. Order placed. Will have Dr. Alvester Morin sign paper order when he comes in tomorrow. Patient should be able use this order to turn in to his insurance company.

## 2013-05-11 ENCOUNTER — Other Ambulatory Visit: Payer: Self-pay | Admitting: Family Medicine

## 2013-05-14 NOTE — Telephone Encounter (Signed)
Last seen 04/19/13  Dr Alvester Morin  If approved route to nurse to phone into [facility]

## 2013-05-14 NOTE — Telephone Encounter (Signed)
Please forward to Dr. Modesto Charon

## 2013-05-16 ENCOUNTER — Other Ambulatory Visit: Payer: Self-pay | Admitting: Family Medicine

## 2013-05-16 MED ORDER — TESTOSTERONE 20.25 MG/1.25GM (1.62%) TD GEL: 2.0000 | Freq: Every morning | TRANSDERMAL | Status: DC

## 2013-05-16 NOTE — Telephone Encounter (Signed)
Rx ready for nurse to Phone in. 

## 2013-05-16 NOTE — Telephone Encounter (Signed)
androgel called to [facility] and left on voice mail

## 2013-07-05 ENCOUNTER — Other Ambulatory Visit: Payer: Self-pay | Admitting: Family Medicine

## 2013-07-09 NOTE — Telephone Encounter (Signed)
Do not see on med list. Please advise 

## 2013-07-10 ENCOUNTER — Other Ambulatory Visit: Payer: Self-pay | Admitting: Family Medicine

## 2013-11-07 ENCOUNTER — Other Ambulatory Visit: Payer: Self-pay | Admitting: Dermatology

## 2014-07-04 ENCOUNTER — Encounter: Payer: Self-pay | Admitting: *Deleted

## 2014-07-22 ENCOUNTER — Encounter: Payer: Self-pay | Admitting: Family Medicine

## 2014-07-22 ENCOUNTER — Ambulatory Visit (INDEPENDENT_AMBULATORY_CARE_PROVIDER_SITE_OTHER): Payer: Medicare Other | Admitting: Family Medicine

## 2014-07-22 VITALS — BP 138/79 | HR 73 | Temp 97.2°F | Ht 71.0 in | Wt 175.0 lb

## 2014-07-22 DIAGNOSIS — J441 Chronic obstructive pulmonary disease with (acute) exacerbation: Secondary | ICD-10-CM

## 2014-07-22 DIAGNOSIS — J988 Other specified respiratory disorders: Secondary | ICD-10-CM

## 2014-07-22 DIAGNOSIS — J209 Acute bronchitis, unspecified: Secondary | ICD-10-CM

## 2014-07-22 DIAGNOSIS — J40 Bronchitis, not specified as acute or chronic: Secondary | ICD-10-CM | POA: Diagnosis not present

## 2014-07-22 LAB — POCT CBC
GRANULOCYTE PERCENT: 63.7 % (ref 37–80)
HEMATOCRIT: 47.5 % (ref 43.5–53.7)
HEMOGLOBIN: 15.1 g/dL (ref 14.1–18.1)
LYMPH, POC: 2.5 (ref 0.6–3.4)
MCH: 30.5 pg (ref 27–31.2)
MCHC: 31.9 g/dL (ref 31.8–35.4)
MCV: 95.6 fL (ref 80–97)
MPV: 7.2 fL (ref 0–99.8)
PLATELET COUNT, POC: 322 10*3/uL (ref 142–424)
POC LYMPH PERCENT: 31.5 %L (ref 10–50)
RBC: 5 M/uL (ref 4.69–6.13)
RDW, POC: 12.4 %
WBC: 7.9 10*3/uL (ref 4.6–10.2)

## 2014-07-22 MED ORDER — SULFAMETHOXAZOLE-TRIMETHOPRIM 800-160 MG PO TABS: 1.0000 | ORAL_TABLET | Freq: Two times a day (BID) | ORAL | Status: DC

## 2014-07-22 NOTE — Progress Notes (Signed)
Subjective:    Patient ID: Patrick Novak, male    DOB: 04/05/1941, 74 y.o.   MRN: [id number]  HPI Pt here for cough and congestion that started about 4 days ago. The patient has a history of COPD and stopped smoking over 18 years ago. He has albuterol nebulizer at home and uses a once a day. We will refill this for him. The patient is due to come in for a physical in about 10 days and if he is not better at that time we will make sure that he gets a chest x-ray. This patient also has a history of non-Hodgkin's lymphoma.       Patient Active Problem List   Diagnosis Date Noted  . Osteopenia 08/20/2012  . 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 Hiatal hernia 08/20/2012  . HYPERTENSION, UNSPECIFIED 07/21/2009  . BRADYCARDIA 07/21/2009  . DYSPNEA 07/21/2009   Outpatient Encounter Prescriptions as of 07/22/2014  Medication Sig  . aspirin 81 MG EC tablet Take 81 mg by mouth daily.    . Calcium Carbonate-Vitamin D 600-400 MG-UNIT per tablet Take 1 tablet by mouth 2 (two) times daily.    [name] dexlansoprazole (DEXILANT) 60 MG capsule Take 60 mg by mouth as needed.    [name] FLOVENT HFA 110 MCG/ACT inhaler INHALE ONE PUFF BY MOUTH EVERY DAY AS DIRECTED  . Fluticasone-Salmeterol (ADVAIR DISKUS) 250-50 MCG/DOSE AEPB Inhale 1 puff into the lungs 2 (two) times daily.  [name] lisinopril (PRINIVIL,ZESTRIL) 10 MG tablet TAKE ONE TABLET BY MOUTH ONCE DAILY  . meclizine (ANTIVERT) 12.5 MG tablet Take 12.5 mg by mouth as needed.    . meloxicam (MOBIC) 15 MG tablet Take 15 mg by mouth as needed.    . Multiple Vitamin (MULTIVITAMIN) tablet Take 1 tablet by mouth daily.    [name] PROAIR HFA 108 (90 BASE) MCG/ACT inhaler   . Testosterone (ANDROGEL) 20.25 MG/1.25GM (1.62%) GEL Place 2 Squirts onto the skin every morning.  . [DISCONTINUED] doxycycline (VIBRAMYCIN) 100 MG capsule Take 1 capsule (100 mg total) by mouth 2 (two) times daily.  . [DISCONTINUED] promethazine (PHENERGAN) 12.5 MG  tablet     Review of Systems  Constitutional: Negative.   HENT: Positive for congestion.   Eyes: Negative.   Respiratory: Positive for cough.   Cardiovascular: Negative.   Gastrointestinal: Negative.   Endocrine: Negative.   Genitourinary: Negative.   Musculoskeletal: Negative.   Skin: Negative.   Allergic/Immunologic: Negative.   Neurological: Negative.   Hematological: Negative.   Psychiatric/Behavioral: Negative.        Objective:   Physical Exam  Constitutional: He is oriented to person, place, and time. He appears well-developed and well-nourished. No distress.  HENT:  Head: Normocephalic and atraumatic.  Right Ear: External ear normal.  Left Ear: External ear normal.  Mouth/Throat: Oropharynx is clear and moist. No oropharyngeal exudate.  Nasal congestion bilaterally  Eyes: Conjunctivae and EOM are normal. Pupils are equal, round, and reactive to light. Right eye exhibits no discharge. Left eye exhibits no discharge. No scleral icterus.  Neck: Normal range of motion. Neck supple. No thyromegaly present.  Cardiovascular: Normal rate, regular rhythm, normal heart sounds and intact distal pulses.   No murmur heard. The heart has a regular rate and rhythm at 72/m  Pulmonary/Chest: Effort normal and breath sounds normal. No respiratory distress. He has no wheezes. He has no rales. He exhibits no tenderness.  There are no wheezes rhonchi or rales. The patient has a  coarse dry cough.  Abdominal: He exhibits no mass.  Musculoskeletal: Normal range of motion. He exhibits no edema.  Lymphadenopathy:    He has no cervical adenopathy.  Neurological: He is alert and oriented to person, place, and time.  Skin: Skin is warm and dry. No rash noted.  Psychiatric: He has a normal mood and affect. His behavior is normal. Judgment and thought content normal.  Nursing note and vitals reviewed.  BP 138/79 mmHg  Pulse 73  Temp(Src) 97.2 F (36.2 C) (Oral)  Ht 5\' 11"  (1.803 m)  Wt  175 lb (79.379 kg)  BMI 24.42 kg/m2        Assessment & Plan:  1. Congestion of upper airway -Use nebulizer regularly at least twice daily -Drink plenty of fluids -keep the house as cool as possible - POCT CBC  2. Bronchitis with bronchospasm -Take antibiotic as directed and use Mucinex for cough and congestion along with nebulizer -Return to clinic if worse -Get chest x-ray with physical exam  3. COPD exacerbation  Patient Instructions  Continue to use the nebulizer once or twice daily on a regular basis with albuterol Drink plenty of fluids Use saline nose spray Take Mucinex maximum strength, blue and white in color, 1 twice daily with a large glass of water Take antibiotic as directed-----be sure and take this with food because it may make you nauseated on an empty stomach   Arrie Senate MD

## 2014-07-22 NOTE — Patient Instructions (Signed)
Continue to use the nebulizer once or twice daily on a regular basis with albuterol Drink plenty of fluids Use saline nose spray Take Mucinex maximum strength, blue and white in color, 1 twice daily with a large glass of water Take antibiotic as directed-----be sure and take this with food because it may make you nauseated on an empty stomach

## 2014-07-23 MED ORDER — ALBUTEROL SULFATE HFA 108 (90 BASE) MCG/ACT IN AERS: 2.0000 | INHALATION_SPRAY | Freq: Four times a day (QID) | RESPIRATORY_TRACT | Status: DC | PRN

## 2014-07-23 NOTE — Addendum Note (Signed)
Addended by: Zannie [name] on: 07/23/2014 09:35 AM   Modules accepted: Orders

## 2014-07-25 ENCOUNTER — Telehealth: Payer: Self-pay | Admitting: Family Medicine

## 2014-07-25 NOTE — Telephone Encounter (Signed)
Stp's wife Patrick Novak who states he started taking the Bactrim DS/Septra DS, he has had a hard time sleeping, his face gets flush after taking it, and has some wheezing. Advised to stop taking the antibiotic until someone from our office calls him back to advise. Pt voiced understanding.

## 2014-07-25 NOTE — Telephone Encounter (Signed)
Stp's wife and she states he has an appt Wednesday with Dr.Miller. And will keep that appt, will CB if wheezing continues or worsens.

## 2014-07-25 NOTE — Telephone Encounter (Signed)
Please give this patient an appointment for recheck with a provider--- preferably Dr. Sabra Heck if he is going to be back in the office since he saw him initially.

## 2014-07-26 DIAGNOSIS — J441 Chronic obstructive pulmonary disease with (acute) exacerbation: Secondary | ICD-10-CM | POA: Diagnosis not present

## 2014-07-26 DIAGNOSIS — R05 Cough: Secondary | ICD-10-CM | POA: Diagnosis not present

## 2014-07-26 DIAGNOSIS — Z8679 Personal history of other diseases of the circulatory system: Secondary | ICD-10-CM | POA: Diagnosis not present

## 2014-07-26 DIAGNOSIS — R0602 Shortness of breath: Secondary | ICD-10-CM | POA: Diagnosis not present

## 2014-07-26 DIAGNOSIS — J449 Chronic obstructive pulmonary disease, unspecified: Secondary | ICD-10-CM | POA: Diagnosis not present

## 2014-07-26 DIAGNOSIS — R0789 Other chest pain: Secondary | ICD-10-CM | POA: Diagnosis not present

## 2014-07-31 ENCOUNTER — Encounter: Payer: Self-pay | Admitting: Family Medicine

## 2014-07-31 ENCOUNTER — Ambulatory Visit (INDEPENDENT_AMBULATORY_CARE_PROVIDER_SITE_OTHER): Payer: Medicare Other | Admitting: Family Medicine

## 2014-07-31 VITALS — BP 127/69 | HR 88 | Temp 97.5°F | Ht 71.0 in | Wt 172.0 lb

## 2014-07-31 DIAGNOSIS — E291 Testicular hypofunction: Secondary | ICD-10-CM | POA: Diagnosis not present

## 2014-07-31 DIAGNOSIS — Z0000 Encounter for general adult medical examination without abnormal findings: Secondary | ICD-10-CM | POA: Diagnosis not present

## 2014-07-31 NOTE — Progress Notes (Signed)
   Subjective:    Patient ID: Patrick Novak, male    DOB: 20-Dec-1940, 74 y.o.   MRN: [id number]  HPI  74 year old gentleman with main problems of COPD and low testosterone with osteopenia. Medications include lisinopril on AndroGel but he has not taken lisinopril in 3 days. AndroGel dose has been reduced and we probably need to see a testosterone level. He had a recent exacerbation of COPD and is being treated with steroid Dosepak, Levaquin, and albuterol and Advair.    Review of Systems  HENT: Negative.   Respiratory: Positive for cough and shortness of breath.   Cardiovascular: Negative.   Gastrointestinal: Negative.   Psychiatric/Behavioral: Negative.    Patient Active Problem List   Diagnosis Date Noted  . Osteopenia 08/20/2012  . 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 Hiatal hernia 08/20/2012  . HYPERTENSION, UNSPECIFIED 07/21/2009  . BRADYCARDIA 07/21/2009  . DYSPNEA 07/21/2009   Outpatient Encounter Prescriptions as of 07/31/2014  Medication Sig  . albuterol (PROAIR HFA) 108 (90 BASE) MCG/ACT inhaler Inhale 2 puffs into the lungs every 6 (six) hours as needed for wheezing or shortness of breath.  [name] aspirin 81 MG EC tablet Take 81 mg by mouth daily.    . Calcium Carbonate-Vitamin D 600-400 MG-UNIT per tablet Take 1 tablet by mouth 2 (two) times daily.    [name] dexlansoprazole (DEXILANT) 60 MG capsule Take 60 mg by mouth as needed.    [name] FLOVENT HFA 110 MCG/ACT inhaler INHALE ONE PUFF BY MOUTH EVERY DAY AS DIRECTED  . Fluticasone-Salmeterol (ADVAIR DISKUS) 250-50 MCG/DOSE AEPB Inhale 1 puff into the lungs 2 (two) times daily.  [name] lisinopril (PRINIVIL,ZESTRIL) 10 MG tablet TAKE ONE TABLET BY MOUTH ONCE DAILY  . meclizine (ANTIVERT) 12.5 MG tablet Take 12.5 mg by mouth as needed.    . meloxicam (MOBIC) 15 MG tablet Take 15 mg by mouth as needed.    . Multiple Vitamin (MULTIVITAMIN) tablet Take 1 tablet by mouth daily.    . Testosterone (ANDROGEL)  20.25 MG/1.25GM (1.62%) GEL Place 2 Squirts onto the skin every morning.  . [DISCONTINUED] sulfamethoxazole-trimethoprim (BACTRIM DS,SEPTRA DS) 800-160 MG per tablet Take 1 tablet by mouth 2 (two) times daily.  . [DISCONTINUED] ipratropium-albuterol (DUONEB) 0.5-2.5 (3) MG/3ML nebulizer solution 3 mL   . [DISCONTINUED] testosterone cypionate (DEPOTESTOTERONE CYPIONATE) injection 200 mg   . [DISCONTINUED] testosterone cypionate (DEPOTESTOTERONE CYPIONATE) injection 200 mg       Objective:   Physical Exam  Constitutional: He is oriented to person, place, and time. He appears well-developed.  Slight stature  HENT:  Head: Normocephalic.  Cardiovascular: Normal rate and normal heart sounds.   Pulmonary/Chest: Effort normal and breath sounds normal.  Abdominal: Soft.  Musculoskeletal: Normal range of motion.  Neurological: He is alert and oriented to person, place, and time.          Assessment & Plan:  1. Health care maintenance  - CMP14+EGFR - Lipid panel - Testosterone,Free and Total; Future

## 2014-08-01 ENCOUNTER — Other Ambulatory Visit: Payer: Medicare Other

## 2014-08-01 DIAGNOSIS — Z0000 Encounter for general adult medical examination without abnormal findings: Secondary | ICD-10-CM | POA: Diagnosis not present

## 2014-08-01 NOTE — Addendum Note (Signed)
Addended by: Selmer Dominion on: 08/01/2014 08:03 AM   Modules accepted: Orders

## 2014-08-01 NOTE — Progress Notes (Signed)
Lab only 

## 2014-08-02 ENCOUNTER — Ambulatory Visit: Payer: Medicare Other | Admitting: Family Medicine

## 2014-08-02 ENCOUNTER — Encounter: Payer: Self-pay | Admitting: Family Medicine

## 2014-08-02 LAB — CMP14+EGFR
ALK PHOS: 96 IU/L (ref 39–117)
ALT: 17 IU/L (ref 0–44)
AST: 15 IU/L (ref 0–40)
Albumin/Globulin Ratio: 1.8 (ref 1.1–2.5)
Albumin: 3.9 g/dL (ref 3.5–4.8)
BILIRUBIN TOTAL: 0.8 mg/dL (ref 0.0–1.2)
BUN / CREAT RATIO: 20 (ref 10–22)
BUN: 24 mg/dL (ref 8–27)
CHLORIDE: 98 mmol/L (ref 97–108)
CO2: 26 mmol/L (ref 18–29)
Calcium: 9.6 mg/dL (ref 8.6–10.2)
Creatinine, Ser: 1.22 mg/dL (ref 0.76–1.27)
GFR calc Af Amer: 67 mL/min/{1.73_m2} (ref >59)
GFR calc non Af Amer: 58 mL/min/{1.73_m2} — ABNORMAL LOW (ref >59)
Globulin, Total: 2.2 g/dL (ref 1.5–4.5)
Glucose: 90 mg/dL (ref 65–99)
POTASSIUM: 4.5 mmol/L (ref 3.5–5.2)
SODIUM: 140 mmol/L (ref 134–144)
Total Protein: 6.1 g/dL (ref 6.0–8.5)

## 2014-08-02 LAB — TESTOSTERONE,FREE AND TOTAL
TESTOSTERONE FREE: 4 pg/mL — AB (ref 6.6–18.1)
Testosterone: 300 ng/dL — ABNORMAL LOW (ref 348–1197)

## 2014-08-02 LAB — LIPID PANEL
CHOLESTEROL TOTAL: 105 mg/dL (ref 100–199)
Chol/HDL Ratio: 2.2 ratio units (ref 0.0–5.0)
HDL: 47 mg/dL (ref >39)
LDL Calculated: 32 mg/dL (ref 0–99)
TRIGLYCERIDES: 128 mg/dL (ref 0–149)
VLDL Cholesterol Cal: 26 mg/dL (ref 5–40)

## 2014-08-09 ENCOUNTER — Telehealth: Payer: Self-pay | Admitting: Family Medicine

## 2014-08-09 MED ORDER — TESTOSTERONE 20.25 MG/1.25GM (1.62%) TD GEL: 2.0000 | Freq: Every morning | TRANSDERMAL | Status: DC

## 2014-08-09 NOTE — Telephone Encounter (Signed)
Patient aware that refill has been sent into pharmacy.

## 2014-08-12 ENCOUNTER — Telehealth: Payer: Self-pay | Admitting: Family Medicine

## 2014-08-12 MED ORDER — AMOXICILLIN 500 MG PO CAPS: 500.0000 mg | ORAL_CAPSULE | Freq: Three times a day (TID) | ORAL | Status: DC

## 2014-08-12 NOTE — Telephone Encounter (Signed)
Correction as amoxicillin 500, one 3 times daily for 10 days

## 2014-08-12 NOTE — Telephone Encounter (Signed)
Start amoxicillin 503 times a day for 10 days and make sure that his AndroGel prescription has been [facility]

## 2014-08-12 NOTE — Telephone Encounter (Signed)
Amox sent to pharmacy & Androgel called in

## 2014-09-20 ENCOUNTER — Encounter: Payer: Self-pay | Admitting: *Deleted

## 2014-09-20 ENCOUNTER — Ambulatory Visit (INDEPENDENT_AMBULATORY_CARE_PROVIDER_SITE_OTHER): Payer: Medicare Other | Admitting: *Deleted

## 2014-09-20 ENCOUNTER — Ambulatory Visit: Payer: Medicare Other

## 2014-09-20 ENCOUNTER — Ambulatory Visit (INDEPENDENT_AMBULATORY_CARE_PROVIDER_SITE_OTHER): Payer: Medicare Other

## 2014-09-20 VITALS — BP 127/77 | HR 66 | Ht 70.0 in | Wt 175.0 lb

## 2014-09-20 DIAGNOSIS — Z0000 Encounter for general adult medical examination without abnormal findings: Secondary | ICD-10-CM

## 2014-09-20 DIAGNOSIS — M899 Disorder of bone, unspecified: Secondary | ICD-10-CM

## 2014-09-20 DIAGNOSIS — M8580 Other specified disorders of bone density and structure, unspecified site: Secondary | ICD-10-CM

## 2014-09-20 DIAGNOSIS — J449 Chronic obstructive pulmonary disease, unspecified: Secondary | ICD-10-CM

## 2014-09-20 MED ORDER — FLUTICASONE-SALMETEROL 250-50 MCG/DOSE IN AEPB: 1.0000 | INHALATION_SPRAY | Freq: Two times a day (BID) | RESPIRATORY_TRACT | Status: DC

## 2014-09-20 MED ORDER — MELOXICAM 15 MG PO TABS: 15.0000 mg | ORAL_TABLET | Freq: Every day | ORAL | Status: DC

## 2014-09-20 NOTE — Patient Instructions (Signed)
Preventive Care for Adults A healthy lifestyle and preventive care can promote health and wellness. Preventive health guidelines for men include the following key practices:  A routine yearly physical is a good way to check with your health care provider about your health and preventative screening. It is a chance to share any concerns and updates on your health and to receive a thorough exam.  Visit your dentist for a routine exam and preventative care every 6 months. Brush your teeth twice a day and floss once a day. Good oral hygiene prevents tooth decay and gum disease.  The frequency of eye exams is based on your age, health, family medical history, use of contact lenses, and other factors. Follow your health care provider's recommendations for frequency of eye exams.  Eat a healthy diet. Foods such as vegetables, fruits, whole grains, low-fat dairy products, and lean protein foods contain the nutrients you need without too many calories. Decrease your intake of foods high in solid fats, added sugars, and salt. Eat the right amount of calories for you.Get information about a proper diet from your health care provider, if necessary.  Regular physical exercise is one of the most important things you can do for your health. Most adults should get at least 150 minutes of moderate-intensity exercise (any activity that increases your heart rate and causes you to sweat) each week. In addition, most adults need muscle-strengthening exercises on 2 or more days a week.  Maintain a healthy weight. The body mass index (BMI) is a screening tool to identify possible weight problems. It provides an estimate of body fat based on height and weight. Your health care provider can find your BMI and can help you achieve or maintain a healthy weight.For adults 20 years and older:  A BMI below 18.5 is considered underweight.  A BMI of 18.5 to 24.9 is normal.  A BMI of 25 to 29.9 is considered overweight.  A BMI  of 30 and above is considered obese.  Maintain normal blood lipids and cholesterol levels by exercising and minimizing your intake of saturated fat. Eat a balanced diet with plenty of fruit and vegetables. Blood tests for lipids and cholesterol should begin at age 50 and be repeated every 5 years. If your lipid or cholesterol levels are high, you are over 50, or you are at high risk for heart disease, you may need your cholesterol levels checked more frequently.Ongoing high lipid and cholesterol levels should be treated with medicines if diet and exercise are not working.  If you smoke, find out from your health care provider how to quit. If you do not use tobacco, do not start.  Lung cancer screening is recommended for adults aged 73-80 years who are at high risk for developing lung cancer because of a history of smoking. A yearly low-dose CT scan of the lungs is recommended for people who have at least a 30-pack-year history of smoking and are a current smoker or have quit within the past 15 years. A pack year of smoking is smoking an average of 1 pack of cigarettes a day for 1 year (for example: 1 pack a day for 30 years or 2 packs a day for 15 years). Yearly screening should continue until the smoker has stopped smoking for at least 15 years. Yearly screening should be stopped for people who develop a health problem that would prevent them from having lung cancer treatment.  If you choose to drink alcohol, do not have more than  2 drinks per day. One drink is considered to be 12 ounces (355 mL) of beer, 5 ounces (148 mL) of wine, or 1.5 ounces (44 mL) of liquor.  Avoid use of street drugs. Do not share needles with anyone. Ask for help if you need support or instructions about stopping the use of drugs.  High blood pressure causes heart disease and increases the risk of stroke. Your blood pressure should be checked at least every 1-2 years. Ongoing high blood pressure should be treated with  medicines, if weight loss and exercise are not effective.  If you are 45-79 years old, ask your health care provider if you should take aspirin to prevent heart disease.  Diabetes screening involves taking a blood sample to check your fasting blood sugar level. This should be done once every 3 years, after age 45, if you are within normal weight and without risk factors for diabetes. Testing should be considered at a younger age or be carried out more frequently if you are overweight and have at least 1 risk factor for diabetes.  Colorectal cancer can be detected and often prevented. Most routine colorectal cancer screening begins at the age of 50 and continues through age 75. However, your health care provider may recommend screening at an earlier age if you have risk factors for colon cancer. On a yearly basis, your health care provider may provide home test kits to check for hidden blood in the stool. Use of a small camera at the end of a tube to directly examine the colon (sigmoidoscopy or colonoscopy) can detect the earliest forms of colorectal cancer. Talk to your health care provider about this at age 50, when routine screening begins. Direct exam of the colon should be repeated every 5-10 years through age 75, unless early forms of precancerous polyps or small growths are found.  People who are at an increased risk for hepatitis B should be screened for this virus. You are considered at high risk for hepatitis B if:  You were born in a country where hepatitis B occurs often. Talk with your health care provider about which countries are considered high risk.  Your parents were born in a high-risk country and you have not received a shot to protect against hepatitis B (hepatitis B vaccine).  You have HIV or AIDS.  You use needles to inject street drugs.  You live with, or have sex with, someone who has hepatitis B.  You are a man who has sex with other men (MSM).  You get hemodialysis  treatment.  You take certain medicines for conditions such as cancer, organ transplantation, and autoimmune conditions.  Hepatitis C blood testing is recommended for all people born from 1945 through 1965 and any individual with known risks for hepatitis C.  Practice safe sex. Use condoms and avoid high-risk sexual practices to reduce the spread of sexually transmitted infections (STIs). STIs include gonorrhea, chlamydia, syphilis, trichomonas, herpes, HPV, and human immunodeficiency virus (HIV). Herpes, HIV, and HPV are viral illnesses that have no cure. They can result in disability, cancer, and death.  If you are at risk of being infected with HIV, it is recommended that you take a prescription medicine daily to prevent HIV infection. This is called preexposure prophylaxis (PrEP). You are considered at risk if:  You are a man who has sex with other men (MSM) and have other risk factors.  You are a heterosexual man, are sexually active, and are at increased risk for HIV infection.    You take drugs by injection.  You are sexually active with a partner who has HIV.  Talk with your health care provider about whether you are at high risk of being infected with HIV. If you choose to begin PrEP, you should first be tested for HIV. You should then be tested every 3 months for as long as you are taking PrEP.  A one-time screening for abdominal aortic aneurysm (AAA) and surgical repair of large AAAs by ultrasound are recommended for men ages 32 to 67 years who are current or former smokers.  Healthy men should no longer receive prostate-specific antigen (PSA) blood tests as part of routine cancer screening. Talk with your health care provider about prostate cancer screening.  Testicular cancer screening is not recommended for adult males who have no symptoms. Screening includes self-exam, a health care provider exam, and other screening tests. Consult with your health care provider about any symptoms  you have or any concerns you have about testicular cancer.  Use sunscreen. Apply sunscreen liberally and repeatedly throughout the day. You should seek shade when your shadow is shorter than you. Protect yourself by wearing long sleeves, pants, a wide-brimmed hat, and sunglasses year round, whenever you are outdoors.  Once a month, do a whole-body skin exam, using a mirror to look at the skin on your back. Tell your health care provider about new moles, moles that have irregular borders, moles that are larger than a pencil eraser, or moles that have changed in shape or color.  Stay current with required vaccines (immunizations).  Influenza vaccine. All adults should be immunized every year.  Tetanus, diphtheria, and acellular pertussis (Td, Tdap) vaccine. An adult who has not previously received Tdap or who does not know his vaccine status should receive 1 dose of Tdap. This initial dose should be followed by tetanus and diphtheria toxoids (Td) booster doses every 10 years. Adults with an unknown or incomplete history of completing a 3-dose immunization series with Td-containing vaccines should begin or complete a primary immunization series including a Tdap dose. Adults should receive a Td booster every 10 years.  Varicella vaccine. An adult without evidence of immunity to varicella should receive 2 doses or a second dose if he has previously received 1 dose.  Human papillomavirus (HPV) vaccine. Males aged 68-21 years who have not received the vaccine previously should receive the 3-dose series. Males aged 22-26 years may be immunized. Immunization is recommended through the age of 6 years for any male who has sex with males and did not get any or all doses earlier. Immunization is recommended for any person with an immunocompromised condition through the age of 49 years if he did not get any or all doses earlier. During the 3-dose series, the second dose should be obtained 4-8 weeks after the first  dose. The third dose should be obtained 24 weeks after the first dose and 16 weeks after the second dose.  Zoster vaccine. One dose is recommended for adults aged 50 years or older unless certain conditions are present.  Measles, mumps, and rubella (MMR) vaccine. Adults born before [birth year] generally are considered immune to measles and mumps. Adults born in [birth year] or later should have 1 or more doses of MMR vaccine unless there is a contraindication to the vaccine or there is laboratory evidence of immunity to each of the three diseases. A routine second dose of MMR vaccine should be obtained at least 28 days after the first dose for students attending postsecondary  schools, health care workers, or international travelers. People who received inactivated measles vaccine or an unknown type of measles vaccine during 1963-1967 should receive 2 doses of MMR vaccine. People who received inactivated mumps vaccine or an unknown type of mumps vaccine before 1979 and are at high risk for mumps infection should consider immunization with 2 doses of MMR vaccine. Unvaccinated health care workers born before 1957 who lack laboratory evidence of measles, mumps, or rubella immunity or laboratory confirmation of disease should consider measles and mumps immunization with 2 doses of MMR vaccine or rubella immunization with 1 dose of MMR vaccine.  Pneumococcal 13-valent conjugate (PCV13) vaccine. When indicated, a person who is uncertain of his immunization history and has no record of immunization should receive the PCV13 vaccine. An adult aged 19 years or older who has certain medical conditions and has not been previously immunized should receive 1 dose of PCV13 vaccine. This PCV13 should be followed with a dose of pneumococcal polysaccharide (PPSV23) vaccine. The PPSV23 vaccine dose should be obtained at least 8 weeks after the dose of PCV13 vaccine. An adult aged 19 years or older who has certain medical conditions and  previously received 1 or more doses of PPSV23 vaccine should receive 1 dose of PCV13. The PCV13 vaccine dose should be obtained 1 or more years after the last PPSV23 vaccine dose.  Pneumococcal polysaccharide (PPSV23) vaccine. When PCV13 is also indicated, PCV13 should be obtained first. All adults aged 65 years and older should be immunized. An adult younger than age 65 years who has certain medical conditions should be immunized. Any person who resides in a nursing home or long-term care facility should be immunized. An adult smoker should be immunized. People with an immunocompromised condition and certain other conditions should receive both PCV13 and PPSV23 vaccines. People with human immunodeficiency virus (HIV) infection should be immunized as soon as possible after diagnosis. Immunization during chemotherapy or radiation therapy should be avoided. Routine use of PPSV23 vaccine is not recommended for American Indians, [place] Natives, or people younger than 65 years unless there are medical conditions that require PPSV23 vaccine. When indicated, people who have unknown immunization and have no record of immunization should receive PPSV23 vaccine. One-time revaccination 5 years after the first dose of PPSV23 is recommended for people aged 19-64 years who have chronic kidney failure, nephrotic syndrome, asplenia, or immunocompromised conditions. People who received 1-2 doses of PPSV23 before age 65 years should receive another dose of PPSV23 vaccine at age 65 years or later if at least 5 years have passed since the previous dose. Doses of PPSV23 are not needed for people immunized with PPSV23 at or after age 65 years.  Meningococcal vaccine. Adults with asplenia or persistent complement component deficiencies should receive 2 doses of quadrivalent meningococcal conjugate (MenACWY-D) vaccine. The doses should be obtained at least 2 months apart. Microbiologists working with certain meningococcal bacteria,  military recruits, people at risk during an outbreak, and people who travel to or live in countries with a high rate of meningitis should be immunized. A first-year college student up through age 21 years who is living in a residence hall should receive a dose if he did not receive a dose on or after his 16th birthday. Adults who have certain high-risk conditions should receive one or more doses of vaccine.  Hepatitis A vaccine. Adults who wish to be protected from this disease, have certain high-risk conditions, work with hepatitis A-infected animals, work in hepatitis A research labs, or   travel to or work in countries with a high rate of hepatitis A should be immunized. Adults who were previously unvaccinated and who anticipate close contact with an international adoptee during the first 60 days after arrival in the [place] from a country with a high rate of hepatitis A should be immunized.  Hepatitis B vaccine. Adults should be immunized if they wish to be protected from this disease, have certain high-risk conditions, may be exposed to blood or other infectious body fluids, are household contacts or sex partners of hepatitis B positive people, are clients or workers in certain care facilities, or travel to or work in countries with a high rate of hepatitis B.  Haemophilus influenzae type b (Hib) vaccine. A previously unvaccinated person with asplenia or sickle cell disease or having a scheduled splenectomy should receive 1 dose of Hib vaccine. Regardless of previous immunization, a recipient of a hematopoietic stem cell transplant should receive a 3-dose series 6-12 months after his successful transplant. Hib vaccine is not recommended for adults with HIV infection. Preventive Service / Frequency Ages 51 and over  Blood pressure check.** / Every 1 to 2 years.  Lipid and cholesterol check.**/ Every 5 years beginning at age 90.  Lung cancer screening. / Every year if you are aged 66-80 years  and have a 30-pack-year history of smoking and currently smoke or have quit within the past 15 years. Yearly screening is stopped once you have quit smoking for at least 15 years or develop a health problem that would prevent you from having lung cancer treatment.  Fecal occult blood test (FOBT) of stool. / Every year beginning at age 54 and continuing until age 67. You may not have to do this test if you get a colonoscopy every 10 years.  Flexible sigmoidoscopy** or colonoscopy.** / Every 5 years for a flexible sigmoidoscopy or every 10 years for a colonoscopy beginning at age 40 and continuing until age 44.  Hepatitis C blood test.** / For all people born from [birth year] through 1965 and any individual with known risks for hepatitis C.  Abdominal aortic aneurysm (AAA) screening.** / A one-time screening for ages 34 to 52 years who are current or former smokers.  Skin self-exam. / Monthly.  Influenza vaccine. / Every year.  Tetanus, diphtheria, and acellular pertussis (Tdap/Td) vaccine.** / 1 dose of Td every 10 years.  Varicella vaccine.** / Consult your health care provider.  Zoster vaccine.** / 1 dose for adults aged 61 years or older.  Pneumococcal 13-valent conjugate (PCV13) vaccine.** / Consult your health care provider.  Pneumococcal polysaccharide (PPSV23) vaccine.** / 1 dose for all adults aged 46 years and older.  Meningococcal vaccine.** / Consult your health care provider.  Hepatitis A vaccine.** / Consult your health care provider.  Hepatitis B vaccine.** / Consult your health care provider.  Haemophilus influenzae type b (Hib) vaccine.** / Consult your health care provider. **Family history and personal history of risk and conditions may change your health care provider's recommendations. Document Released: 08/03/2001 Document Revised: 06/12/2013 Document Reviewed: 11/02/2010 [facility] Patient Information 2015 [place], [place]. This information is not intended to replace  advice given to you by your health care provider. Make sure you discuss any questions you have with your health care provider.

## 2014-09-23 NOTE — Progress Notes (Signed)
Subjective:   Patrick Novak is a 74 y.o. male who presents for an Initial Medicare Annual Wellness Visit. Patrick Novak lives at home with his wife. He was discharged from [facility] about 1 week ago. He was hospitalized for COPD exacerbation and pneumonia. He was given O2 to wear at home but was told if his saturation remained above 90 he didn't have to wear it during the day. His saturations while sitting are generally greater than 93 and 89-90 while ambulating. He is using the oxygen on 2L at night.   Review of Systems   Cardiac Risk Factors include: advanced age (>76men, >64 women);male gender  Pulmonary: Per patient and wife O2 saturation around 89-90 while ambulating on room air and around 93 while sitting.     Objective:    Today's Vitals   09/20/14 0949  BP: 127/77  Pulse: 66  Height: 5\' 10"  (1.778 m)  Weight: 175 lb (79.379 kg)  BMI      25.11  Current Medications (verified) Outpatient Encounter Prescriptions as of 09/20/2014  Medication Sig  . albuterol (PROAIR HFA) 108 (90 BASE) MCG/ACT inhaler Inhale 2 puffs into the lungs every 6 (six) hours as needed for wheezing or shortness of breath.  [name] aspirin 81 MG EC tablet Take 81 mg by mouth daily.    . Calcium Carbonate-Vitamin D 600-400 MG-UNIT per tablet Take 1 tablet by mouth 2 (two) times daily.    . Fluticasone-Salmeterol (ADVAIR DISKUS) 250-50 MCG/DOSE AEPB Inhale 1 puff into the lungs 2 (two) times daily.  . meclizine (ANTIVERT) 12.5 MG tablet Take 12.5 mg by mouth as needed.    . Multiple Vitamin (MULTIVITAMIN) tablet Take 1 tablet by mouth daily.    . Testosterone (ANDROGEL) 20.25 MG/1.25GM (1.62%) GEL Place 2 Squirts onto the skin every morning.  . [DISCONTINUED] Fluticasone-Salmeterol (ADVAIR DISKUS) 250-50 MCG/DOSE AEPB Inhale 1 puff into the lungs 2 (two) times daily.  . meloxicam (MOBIC) 15 MG tablet Take 1 tablet (15 mg total) by mouth daily.  . [DISCONTINUED] amoxicillin (AMOXIL) 500 MG capsule Take 1 capsule (500  mg total) by mouth 3 (three) times daily.  . [DISCONTINUED] dexlansoprazole (DEXILANT) 60 MG capsule Take 60 mg by mouth as needed.    . [DISCONTINUED] FLOVENT HFA 110 MCG/ACT inhaler INHALE ONE PUFF BY MOUTH EVERY DAY AS DIRECTED  . [DISCONTINUED] lisinopril (PRINIVIL,ZESTRIL) 10 MG tablet TAKE ONE TABLET BY MOUTH ONCE DAILY  . [DISCONTINUED] meloxicam (MOBIC) 15 MG tablet Take 15 mg by mouth as needed.      Allergies (verified) Ciprofloxacin; Codeine; Doxycycline; and Zithromax   History: Past Medical History  Diagnosis Date  . Bradycardia     asympotmatic. Holter (2/11) with PACs, 1 run of 5 beats NSVT, HR range 41-125 with no pauses.   [name] GERD (gastroesophageal reflux disease)   . History of stomach cancer     surgery in 2001  . COPD (chronic obstructive pulmonary disease)   . Vertigo   . Low testosterone   . HTN (hypertension)   . MR (mitral regurgitation)     echo 2/11: EF 60%, no regional wall abnormalities, mild MR, mild to moderate RV dialation, mild to moderate RV dysfunction   . Cancer     gastric lymphoma  . Osteopenia   . Vitamin D deficiency   . Hiatal hernia   . Gastric ulcer   . ED (erectile dysfunction)   . Vertigo    Past Surgical History  Procedure Laterality Date  . Cholecystectomy  1995  . 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 Stroke Father   . Heart disease Mother   . Stroke Brother   . Alzheimer's disease Sister   . Lung disease Sister    Social History   Occupational History  . Not on file.   Social History Main Topics  . Smoking status: Former Smoker -- 1.50 packs/day    Types: Cigarettes    Quit date: 08/21/1998  . Smokeless tobacco: Never Used     Comment: quit in 2000  . Alcohol Use: No  . Drug Use: No  . Sexual Activity: Not on file    Activities of Daily Living In your present state of health, do you have any difficulty performing the following activities: 09/20/2014  Is the  patient deaf or have difficulty hearing? Y  Hearing N  Vision N  Difficulty concentrating or making decisions N  Walking or climbing stairs? N  Doing errands, shopping? N  Preparing Food and eating ? N  Using the Toilet? N  In the past six months, have you accidently leaked urine? N  Do you have problems with loss of bowel control? N  Managing your Medications? N  Managing your Finances? N  Housekeeping or managing your Housekeeping? N    Immunizations and Health Maintenance Immunization History  Administered Date(s) Administered  . DTaP 07/23/2002  . Influenza Whole 03/22/2012  . Influenza-Unspecified 05/21/2014  . Pneumococcal Polysaccharide-23 08/21/2006  . Zoster 08/20/2009   Patient reports having a Tdap and Prevnar vaccine within the last year. He will look through his records at home and provide [place] with those dates.     Patient Care Team: Wardell Honour, MD as PCP - General (Family Medicine)  Loralie Champagne, MD as Cardiologist Lizbeth Bark as Pulmonary (patient reported)  Indicate any recent Medical Services you may have received from other than [facility] providers in the past year (date may be approximate).    Assessment:   This is a routine wellness examination for Patrick Novak.   Hearing/Vision screen No hearing or vision deficits noted during visit.   Dietary issues and exercise activities discussed: Current Exercise Habits:: Home exercise routine (Plays golf ), Type of exercise: Other - see comments;walking (stationary bike), Time (Minutes): 40, Frequency (Times/Week): > 6, Weekly Exercise (Minutes/Week): 0, Intensity: Moderate  Goals    . Exercise 3x per week (30 min per time)     Continue to use stationary bike at least 3 times per week. Try to slowly increase the length of each exercise session.       Depression Screen PHQ 2/9 Scores 09/20/2014 07/31/2014 07/22/2014  PHQ - 2 Score 0 0 0    Fall Risk Fall Risk  09/20/2014 07/31/2014 07/22/2014  Falls in the past year?  No No No    Cognitive Function: MMSE - Mini Mental State Exam 09/20/2014  Orientation to time 5  Orientation to Place 5  Registration 3  Attention/ Calculation 5  Recall 3  Language- name 2 objects 2  Language- repeat 1  Language- follow 3 step command 3  Language- read & follow direction 1  Write a sentence 1  Copy design 1  Total score 30    Screening Tests Health Maintenance  Topic Date Due  . TETANUS/TDAP  10/04/2014 (Originally 11/26/1959)  . PNA vac Low Risk Adult (2 of 2 - PCV13) 10/04/2014 (Originally 08/21/2007)  . INFLUENZA VACCINE  01/20/2015  . COLONOSCOPY  08/20/2016  . DEXA SCAN  09/19/2016  . ZOSTAVAX  Completed        Plan:    During the course of the visit Kanye was educated and counseled about the following appropriate screening and preventive services:   Vaccines to include Pneumoccal-obtain last date, Influenza-needed yearly,Tdap-obtain last date  Electrocardiogram-To be done at next office visit on 10/30/14  Colorectal cancer screening-FOBT given. Colonoscopy due 2018.  Cardiovascular disease screening-Lipids done 07/31/2014  Diabetes screening-Glucose tested 07/31/2014   Glaucoma screening-Schedule eye exam and have report sent to our office  Nutrition counseling-Balanced diet with lean protein and fruits/vegetables  Prostate cancer screening-To be done at next office visit 10/30/14  Dexa Scan-Done today. Will be reviewed at office visit on 10/30/14.  Exercise- Continue to use stationary bike at least 3 days a week and try to increase each exercise session as tolerated  Keep appointment with Dr Sabra Heck on 10/30/14.  Continue O2 at night. Follow up sooner if necessary.  Patient Instructions (the written plan) were given to the patient.   Chong Sicilian, RN   09/23/2014       I have reviewed and agree with the above AWV documentation.  Claretta Fraise, M.D.

## 2014-10-03 ENCOUNTER — Ambulatory Visit (INDEPENDENT_AMBULATORY_CARE_PROVIDER_SITE_OTHER): Payer: Medicare Other | Admitting: Family Medicine

## 2014-10-03 ENCOUNTER — Telehealth: Payer: Self-pay

## 2014-10-03 ENCOUNTER — Encounter: Payer: Self-pay | Admitting: Family Medicine

## 2014-10-03 ENCOUNTER — Telehealth: Payer: Self-pay | Admitting: Family Medicine

## 2014-10-03 VITALS — BP 135/69 | HR 63 | Temp 96.4°F | Ht 70.0 in | Wt 174.0 lb

## 2014-10-03 DIAGNOSIS — N179 Acute kidney failure, unspecified: Secondary | ICD-10-CM

## 2014-10-03 DIAGNOSIS — R0602 Shortness of breath: Secondary | ICD-10-CM

## 2014-10-03 DIAGNOSIS — Z1212 Encounter for screening for malignant neoplasm of rectum: Secondary | ICD-10-CM

## 2014-10-03 MED ORDER — ONDANSETRON HCL 4 MG PO TABS: 4.0000 mg | ORAL_TABLET | Freq: Three times a day (TID) | ORAL | Status: DC | PRN

## 2014-10-03 NOTE — Progress Notes (Signed)
   Subjective:    Patient ID: Patrick Novak, male    DOB: 1941-01-11, 74 y.o.   MRN: [id number]  HPI Patient here today for 3 week hospital follow up for URI. He is doing much better and is accompanied today by his wife. He was told that he had an upper respiratory infection it was treated with IV steroids and antibiotics and neb treatments. Also during hospitalization he was told he had some renal insufficiency and it was suggested that he have that checked at this follow-up visit. He was sent home on oxygen to wear 24 7 as well as a pulse oximeter. He is now wearing oxygen just at nighttime. He denies any cough or extreme shortness of breath.       Patient Active Problem List   Diagnosis Date Noted  . Osteopenia 08/20/2012  . 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 Hiatal hernia 08/20/2012  . HYPERTENSION, UNSPECIFIED 07/21/2009  . BRADYCARDIA 07/21/2009  . DYSPNEA 07/21/2009   Outpatient Encounter Prescriptions as of 10/03/2014  Medication Sig  . albuterol (PROAIR HFA) 108 (90 BASE) MCG/ACT inhaler Inhale 2 puffs into the lungs every 6 (six) hours as needed for wheezing or shortness of breath.  [name] aspirin 81 MG EC tablet Take 81 mg by mouth daily.    . Calcium Carbonate-Vitamin D 600-400 MG-UNIT per tablet Take 1 tablet by mouth 2 (two) times daily.    . Fluticasone-Salmeterol (ADVAIR DISKUS) 250-50 MCG/DOSE AEPB Inhale 1 puff into the lungs 2 (two) times daily.  . meclizine (ANTIVERT) 12.5 MG tablet Take 12.5 mg by mouth as needed.    . meloxicam (MOBIC) 15 MG tablet Take 1 tablet (15 mg total) by mouth daily.  . Multiple Vitamin (MULTIVITAMIN) tablet Take 1 tablet by mouth daily.    . Testosterone (ANDROGEL) 20.25 MG/1.25GM (1.62%) GEL Place 2 Squirts onto the skin every morning.     Review of Systems  Constitutional: Negative.   HENT: Negative.   Eyes: Negative.   Respiratory: Positive for shortness of breath (at times - doing much better).     Cardiovascular: Negative.   Gastrointestinal: Negative.   Endocrine: Negative.   Genitourinary: Negative.   Musculoskeletal: Negative.   Skin: Negative.   Allergic/Immunologic: Negative.   Neurological: Negative.   Hematological: Negative.   Psychiatric/Behavioral: Negative.        Objective:   Physical Exam  Constitutional: He is oriented to person, place, and time. He appears well-developed and well-nourished.  Cardiovascular: Normal rate and regular rhythm.   Pulmonary/Chest: Effort normal and breath sounds normal.  Neurological: He is alert and oriented to person, place, and time.   BP 135/69 mmHg  Pulse 63  Temp(Src) 96.4 F (35.8 C) (Oral)  Ht 5' 10" (1.778 m)  Wt 174 lb (78.926 kg)  BMI 24.97 kg/m2  SpO2 98%        Assessment & Plan:  1. Acute renal failure, unspecified acute renal failure type I suspect the acute situation is related to fluid imbalance and should be corrected by today's labs. - BMP8+EGFR   2. DYSPNEA Patient is pretty much asymptomatic today. Dyspnea was related to respiratory infection which seems to have resolved. We walked him around the office and he failed to desat below 90%. I would recommend continuing oxygen at nighttime for the remainder of this month and then probably discontinue  Wardell Honour MD

## 2014-10-03 NOTE — Addendum Note (Signed)
Addended by: Earlene Plater on: 10/03/2014 10:32 AM   Modules accepted: Orders

## 2014-10-03 NOTE — Telephone Encounter (Signed)
Received approval for Androgel Pump appeal valid September 13 2014 through September 27 2015

## 2014-10-03 NOTE — Telephone Encounter (Signed)
Will need prior approval

## 2014-10-04 ENCOUNTER — Telehealth: Payer: Self-pay | Admitting: *Deleted

## 2014-10-04 LAB — BMP8+EGFR
BUN / CREAT RATIO: 16 (ref 10–22)
BUN: 18 mg/dL (ref 8–27)
CHLORIDE: 102 mmol/L (ref 97–108)
CO2: 25 mmol/L (ref 18–29)
Calcium: 9.2 mg/dL (ref 8.6–10.2)
Creatinine, Ser: 1.13 mg/dL (ref 0.76–1.27)
GFR calc non Af Amer: 64 mL/min/{1.73_m2} (ref >59)
GFR, EST AFRICAN AMERICAN: 74 mL/min/{1.73_m2} (ref >59)
Glucose: 118 mg/dL — ABNORMAL HIGH (ref 65–99)
POTASSIUM: 4.1 mmol/L (ref 3.5–5.2)
SODIUM: 141 mmol/L (ref 134–144)

## 2014-10-04 LAB — FECAL OCCULT BLOOD, IMMUNOCHEMICAL: Fecal Occult Bld: POSITIVE — AB

## 2014-10-04 NOTE — Telephone Encounter (Signed)
-----   Message from Wardell Honour, MD sent at 10/04/2014  9:44 AM EDT ----- Metabolic panel is okay

## 2014-10-04 NOTE — Telephone Encounter (Signed)
Call us for results.

## 2014-10-07 NOTE — Progress Notes (Signed)
Patient aware.

## 2014-10-11 DIAGNOSIS — J449 Chronic obstructive pulmonary disease, unspecified: Secondary | ICD-10-CM | POA: Diagnosis not present

## 2014-10-11 NOTE — Progress Notes (Signed)
   Subjective:    Patient ID: Patrick Novak, male    DOB: 09-25-1940, 74 y.o.   MRN: [id number]  HPI    Review of Systems     Objective:   Physical Exam        Assessment & Plan:    2. DYSPNEA Discontinuing Advair. Start on [name] twice a day for COPD the nebulization  Patrick Honour MD

## 2014-10-18 ENCOUNTER — Other Ambulatory Visit: Payer: Medicare Other

## 2014-10-18 DIAGNOSIS — Z1212 Encounter for screening for malignant neoplasm of rectum: Secondary | ICD-10-CM | POA: Diagnosis not present

## 2014-10-18 NOTE — Progress Notes (Signed)
Lab only 

## 2014-10-20 LAB — FECAL OCCULT BLOOD, IMMUNOCHEMICAL: Fecal Occult Bld: NEGATIVE

## 2014-10-30 ENCOUNTER — Ambulatory Visit (INDEPENDENT_AMBULATORY_CARE_PROVIDER_SITE_OTHER): Payer: Medicare Other | Admitting: Family Medicine

## 2014-10-30 ENCOUNTER — Encounter: Payer: Self-pay | Admitting: Family Medicine

## 2014-10-30 VITALS — BP 137/76 | HR 61 | Temp 97.1°F | Ht 70.0 in | Wt 173.0 lb

## 2014-10-30 DIAGNOSIS — R7989 Other specified abnormal findings of blood chemistry: Secondary | ICD-10-CM

## 2014-10-30 DIAGNOSIS — J449 Chronic obstructive pulmonary disease, unspecified: Secondary | ICD-10-CM

## 2014-10-30 DIAGNOSIS — E291 Testicular hypofunction: Secondary | ICD-10-CM | POA: Diagnosis not present

## 2014-10-30 DIAGNOSIS — I10 Essential (primary) hypertension: Secondary | ICD-10-CM | POA: Diagnosis not present

## 2014-10-30 NOTE — Progress Notes (Signed)
   Subjective:    Patient ID: Patrick Novak, male    DOB: [birth year], 74 y.o.   MRN: [id number]  HPI 74 year old gentleman with COPD. Since his last visit he had overnight pulse oximetry measurement and desaturated to only 90-91% by history. Respiratory therapy thought he should stay on oxygen but I do not agree and the patient would like to see a pulmonologist for another opinion.  Generally he feels well. He is able to play golf and raise a garden and do pre-much anything he wants to do and breathing problems does not limit him.    Review of Systems  Constitutional: Negative.   Respiratory: Positive for shortness of breath.   Neurological: Negative.   Psychiatric/Behavioral: Negative.    Patient Active Problem List   Diagnosis Date Noted  . COPD (chronic obstructive pulmonary disease)   . Osteopenia 08/20/2012  . 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 Hiatal hernia 08/20/2012  . Essential hypertension 07/21/2009  . BRADYCARDIA 07/21/2009  . DYSPNEA 07/21/2009   Outpatient Encounter Prescriptions as of 10/30/2014  Medication Sig  . ANDROGEL PUMP 20.25 MG/ACT (1.62%) GEL Apply 2 application topically daily.  [name] aspirin 81 MG EC tablet Take 81 mg by mouth daily.    . Calcium Carbonate-Vitamin D 600-400 MG-UNIT per tablet Take 1 tablet by mouth 2 (two) times daily.    . Fluticasone-Salmeterol (ADVAIR DISKUS) 250-50 MCG/DOSE AEPB Inhale 1 puff into the lungs 2 (two) times daily.  . meclizine (ANTIVERT) 12.5 MG tablet Take 12.5 mg by mouth as needed.    . meloxicam (MOBIC) 15 MG tablet Take 1 tablet (15 mg total) by mouth daily.  . Multiple Vitamin (MULTIVITAMIN) tablet Take 1 tablet by mouth daily.    . ondansetron (ZOFRAN) 4 MG tablet Take 1 tablet (4 mg total) by mouth every 8 (eight) hours as needed for nausea or vomiting.  . Testosterone (ANDROGEL) 20.25 MG/1.25GM (1.62%) GEL Place 2 Squirts onto the skin every morning.  . [DISCONTINUED] albuterol  (PROAIR HFA) 108 (90 BASE) MCG/ACT inhaler Inhale 2 puffs into the lungs every 6 (six) hours as needed for wheezing or shortness of breath. (Patient not taking: Reported on 10/30/2014)   No facility-administered encounter medications on file as of 10/30/2014.       Objective:   Physical Exam  Constitutional: He appears well-developed and well-nourished.  Cardiovascular: Normal rate and regular rhythm.   Pulmonary/Chest: Effort normal and breath sounds normal.  Musculoskeletal:  Has mild contracture of the left hand where he had suffered a traumatized tendon  Psychiatric: He has a normal mood and affect.          Assessment & Plan:  1. Low testosterone Patient wishes to continue on testosterone supplement  2. Essential hypertension Blood pressure is not an issue today. He is not on medication  3. Chronic obstructive pulmonary disease, unspecified COPD, unspecified chronic bronchitis type I think he could probably go off of home oxygen now but he desires to see pulmonary for second opinion. Symptomatically, he does not feel the need for O2

## 2014-10-31 NOTE — Addendum Note (Signed)
Addended by: Ilean China on: 10/31/2014 01:56 PM   Modules accepted: Orders

## 2014-11-10 DIAGNOSIS — J449 Chronic obstructive pulmonary disease, unspecified: Secondary | ICD-10-CM | POA: Diagnosis not present

## 2014-11-20 ENCOUNTER — Other Ambulatory Visit: Payer: Self-pay | Admitting: Family Medicine

## 2014-11-20 MED ORDER — MECLIZINE HCL 12.5 MG PO TABS: 12.5000 mg | ORAL_TABLET | Freq: Two times a day (BID) | ORAL | Status: DC | PRN

## 2014-11-20 NOTE — Telephone Encounter (Signed)
done

## 2014-12-11 DIAGNOSIS — J449 Chronic obstructive pulmonary disease, unspecified: Secondary | ICD-10-CM | POA: Diagnosis not present

## 2014-12-25 DIAGNOSIS — I10 Essential (primary) hypertension: Secondary | ICD-10-CM | POA: Diagnosis not present

## 2014-12-25 DIAGNOSIS — J449 Chronic obstructive pulmonary disease, unspecified: Secondary | ICD-10-CM | POA: Diagnosis not present

## 2015-02-03 ENCOUNTER — Other Ambulatory Visit: Payer: Self-pay | Admitting: *Deleted

## 2015-02-03 MED ORDER — ONDANSETRON HCL 4 MG PO TABS: 4.0000 mg | ORAL_TABLET | Freq: Three times a day (TID) | ORAL | Status: DC | PRN

## 2015-02-03 NOTE — Telephone Encounter (Signed)
Requesting refill on Zofran. Sent into pharmacy.

## 2015-02-06 DIAGNOSIS — J439 Emphysema, unspecified: Secondary | ICD-10-CM | POA: Diagnosis not present

## 2015-02-06 DIAGNOSIS — J449 Chronic obstructive pulmonary disease, unspecified: Secondary | ICD-10-CM | POA: Diagnosis not present

## 2015-02-20 DIAGNOSIS — L820 Inflamed seborrheic keratosis: Secondary | ICD-10-CM | POA: Diagnosis not present

## 2015-02-20 DIAGNOSIS — D225 Melanocytic nevi of trunk: Secondary | ICD-10-CM | POA: Diagnosis not present

## 2015-02-20 DIAGNOSIS — X32XXXA Exposure to sunlight, initial encounter: Secondary | ICD-10-CM | POA: Diagnosis not present

## 2015-02-20 DIAGNOSIS — L570 Actinic keratosis: Secondary | ICD-10-CM | POA: Diagnosis not present

## 2015-03-09 DIAGNOSIS — J449 Chronic obstructive pulmonary disease, unspecified: Secondary | ICD-10-CM | POA: Diagnosis not present

## 2015-03-09 DIAGNOSIS — J439 Emphysema, unspecified: Secondary | ICD-10-CM | POA: Diagnosis not present

## 2015-03-10 DIAGNOSIS — J449 Chronic obstructive pulmonary disease, unspecified: Secondary | ICD-10-CM | POA: Diagnosis not present

## 2015-03-10 DIAGNOSIS — J439 Emphysema, unspecified: Secondary | ICD-10-CM | POA: Diagnosis not present

## 2015-03-27 ENCOUNTER — Ambulatory Visit (INDEPENDENT_AMBULATORY_CARE_PROVIDER_SITE_OTHER): Payer: Medicare Other

## 2015-03-27 DIAGNOSIS — Z23 Encounter for immunization: Secondary | ICD-10-CM | POA: Diagnosis not present

## 2015-04-08 DIAGNOSIS — J439 Emphysema, unspecified: Secondary | ICD-10-CM | POA: Diagnosis not present

## 2015-04-08 DIAGNOSIS — J449 Chronic obstructive pulmonary disease, unspecified: Secondary | ICD-10-CM | POA: Diagnosis not present

## 2015-04-09 DIAGNOSIS — J439 Emphysema, unspecified: Secondary | ICD-10-CM | POA: Diagnosis not present

## 2015-04-09 DIAGNOSIS — J449 Chronic obstructive pulmonary disease, unspecified: Secondary | ICD-10-CM | POA: Diagnosis not present

## 2015-04-12 ENCOUNTER — Ambulatory Visit (INDEPENDENT_AMBULATORY_CARE_PROVIDER_SITE_OTHER): Payer: Medicare Other | Admitting: Family Medicine

## 2015-04-12 ENCOUNTER — Encounter: Payer: Self-pay | Admitting: Family Medicine

## 2015-04-12 VITALS — BP 135/63 | HR 65 | Temp 97.3°F | Ht 70.0 in | Wt 175.4 lb

## 2015-04-12 DIAGNOSIS — J302 Other seasonal allergic rhinitis: Secondary | ICD-10-CM

## 2015-04-12 DIAGNOSIS — J441 Chronic obstructive pulmonary disease with (acute) exacerbation: Secondary | ICD-10-CM | POA: Diagnosis not present

## 2015-04-12 MED ORDER — BENZONATATE 200 MG PO CAPS: 200.0000 mg | ORAL_CAPSULE | Freq: Three times a day (TID) | ORAL | Status: DC | PRN

## 2015-04-12 MED ORDER — BETAMETHASONE SOD PHOS & ACET 6 (3-3) MG/ML IJ SUSP
6.0000 mg | Freq: Once | INTRAMUSCULAR | Status: AC
  Administered 2015-04-12: 6 mg via INTRAMUSCULAR

## 2015-04-12 MED ORDER — AMOXICILLIN-POT CLAVULANATE 875-125 MG PO TABS: 1.0000 | ORAL_TABLET | Freq: Two times a day (BID) | ORAL | Status: DC

## 2015-04-12 NOTE — Progress Notes (Signed)
Subjective:  Patient ID: Patrick Novak, male    DOB: [birth year]  Age: 74 y.o. MRN: [id number]  CC: URI   HPI Patrick Novak presents for 2-3 days of stuffy runny nose drainage down the throat no fever chills or sweats. He sneezed all night the last couple nights. He does have some cough. He has mild productivity from that. Some purulence to the sputum. He is not currently short of breath however he is using Perforomist 3 times a day instead of his Advair based on recommendation of pulmonology. He has not needed his albuterol.  History Patrick Novak has a past medical history of Bradycardia; GERD (gastroesophageal reflux disease); History of stomach cancer; Vertigo; Low testosterone; HTN (hypertension); MR (mitral regurgitation); Cancer ([place]); Osteopenia; Vitamin D deficiency; Hiatal hernia; Gastric ulcer; ED (erectile dysfunction); Vertigo; and COPD (chronic obstructive pulmonary disease) ([place]).   He has past surgical history that includes Cholecystectomy (1995) and radical subtotal gastrectomy (09/1999).   His family history includes Alzheimer's disease in his sister; Heart disease in his mother; Lung disease in his sister; Stroke in his brother and father.He reports that he quit smoking about 16 years ago. His smoking use included Cigarettes. He smoked 1.50 packs per day. He has never used smokeless tobacco. He reports that he does not drink alcohol or use illicit drugs.  Outpatient Prescriptions Prior to Visit  Medication Sig Dispense Refill  . ANDROGEL PUMP 20.25 MG/ACT (1.62%) GEL Apply 2 application topically daily.    [name] aspirin 81 MG EC tablet Take 81 mg by mouth daily.      . Calcium Carbonate-Vitamin D 600-400 MG-UNIT per tablet Take 1 tablet by mouth 2 (two) times daily.      . meclizine (ANTIVERT) 12.5 MG tablet Take 1 tablet (12.5 mg total) by mouth 2 (two) times daily as needed. 30 tablet 1  . meloxicam (MOBIC) 15 MG tablet Take 1 tablet (15 mg total) by mouth daily. 30 tablet 2  . Multiple  Vitamin (MULTIVITAMIN) tablet Take 1 tablet by mouth daily.      . ondansetron (ZOFRAN) 4 MG tablet Take 1 tablet (4 mg total) by mouth every 8 (eight) hours as needed for nausea or vomiting. 30 tablet 0  . Testosterone (ANDROGEL) 20.25 MG/1.25GM (1.62%) GEL Place 2 Squirts onto the skin every morning. 2 g 5  . Fluticasone-Salmeterol (ADVAIR DISKUS) 250-50 MCG/DOSE AEPB Inhale 1 puff into the lungs 2 (two) times daily. 1 each 3   No facility-administered medications prior to visit.    ROS Review of Systems  Constitutional: Negative for fever, chills, activity change and appetite change.  HENT: Positive for congestion, postnasal drip, rhinorrhea and sinus pressure. Negative for ear discharge, ear pain, hearing loss, nosebleeds, sneezing and trouble swallowing.   Respiratory: Negative for chest tightness and shortness of breath.   Cardiovascular: Negative for chest pain and palpitations.  Skin: Negative for rash.    Objective:  BP 135/63 mmHg  Pulse 65  Temp(Src) 97.3 F (36.3 C) (Oral)  Ht 5\' 10"  (1.778 m)  Wt 175 lb 6.4 oz (79.561 kg)  BMI 25.17 kg/m2  BP Readings from Last 3 Encounters:  04/12/15 135/63  10/30/14 137/76  10/03/14 135/69    Wt Readings from Last 3 Encounters:  04/12/15 175 lb 6.4 oz (79.561 kg)  10/30/14 173 lb (78.472 kg)  10/03/14 174 lb (78.926 kg)     Physical Exam  Constitutional: He appears well-developed and well-nourished.  HENT:  Head: Normocephalic and atraumatic.  Right  Ear: Tympanic membrane and external ear normal. No decreased hearing is noted.  Left Ear: Tympanic membrane and external ear normal. No decreased hearing is noted.  Nose: Mucosal edema present. Right sinus exhibits no frontal sinus tenderness. Left sinus exhibits no frontal sinus tenderness.  Mouth/Throat: No oropharyngeal exudate or posterior oropharyngeal erythema.  Neck: No Brudzinski's sign noted.  Pulmonary/Chest: Breath sounds normal. No respiratory distress.    Lymphadenopathy:       Head (right side): No preauricular adenopathy present.       Head (left side): No preauricular adenopathy present.       Right cervical: No superficial cervical adenopathy present.      Left cervical: No superficial cervical adenopathy present.    No results found for: HGBA1C  Lab Results  Component Value Date   WBC 7.9 07/22/2014   HGB 15.1 07/22/2014   HCT 47.5 07/22/2014   GLUCOSE 118* 10/03/2014   CHOL 105 08/01/2014   TRIG 128 08/01/2014   HDL 47 08/01/2014   LDLCALC 32 08/01/2014   ALT 17 08/01/2014   AST 15 08/01/2014   NA 141 10/03/2014   K 4.1 10/03/2014   CL 102 10/03/2014   CREATININE 1.13 10/03/2014   BUN 18 10/03/2014   CO2 25 10/03/2014   PSA 1.33 09/08/2012    No results found.  Assessment & Plan:   Bascom was seen today for uri.  Diagnoses and all orders for this visit:  COPD exacerbation (HCC) -     betamethasone acetate-betamethasone sodium phosphate (CELESTONE) injection 6 mg; Inject 1 mL (6 mg total) into the muscle once.  Other seasonal allergic rhinitis -     betamethasone acetate-betamethasone sodium phosphate (CELESTONE) injection 6 mg; Inject 1 mL (6 mg total) into the muscle once.  Other orders -     amoxicillin-clavulanate (AUGMENTIN) 875-125 MG tablet; Take 1 tablet by mouth 2 (two) times daily. Take all of this medication -     benzonatate (TESSALON) 200 MG capsule; Take 1 capsule (200 mg total) by mouth 3 (three) times daily as needed for cough.   I have discontinued Mr. Litle Fluticasone-Salmeterol. I am also having him start on amoxicillin-clavulanate and benzonatate. Additionally, I am having him maintain his aspirin, Calcium Carbonate-Vitamin D, multivitamin, Testosterone, meloxicam, ANDROGEL PUMP, meclizine, ondansetron, and formoterol. We administered betamethasone acetate-betamethasone sodium phosphate.  Meds ordered this encounter  Medications  . formoterol (PERFOROMIST) 20 MCG/2ML nebulizer solution     Sig: Take 20 mcg by nebulization 2 (two) times daily.  [name] amoxicillin-clavulanate (AUGMENTIN) 875-125 MG tablet    Sig: Take 1 tablet by mouth 2 (two) times daily. Take all of this medication    Dispense:  20 tablet    Refill:  0  . betamethasone acetate-betamethasone sodium phosphate (CELESTONE) injection 6 mg    Sig:   . benzonatate (TESSALON) 200 MG capsule    Sig: Take 1 capsule (200 mg total) by mouth 3 (three) times daily as needed for cough.    Dispense:  20 capsule    Refill:  0     Follow-up: Return if symptoms worsen or fail to improve.  Claretta Fraise, M.D.

## 2015-04-14 ENCOUNTER — Other Ambulatory Visit: Payer: Self-pay | Admitting: Family Medicine

## 2015-04-15 NOTE — Telephone Encounter (Signed)
Last seen 04/12/15  Dr Livia Snellen  If approved route to nurse to call into [facility]

## 2015-04-16 NOTE — Telephone Encounter (Signed)
Please review and advise.

## 2015-04-17 NOTE — Telephone Encounter (Signed)
Called in [place] and left on VM

## 2015-05-07 ENCOUNTER — Ambulatory Visit (INDEPENDENT_AMBULATORY_CARE_PROVIDER_SITE_OTHER): Payer: Medicare Other | Admitting: Family Medicine

## 2015-05-07 ENCOUNTER — Encounter: Payer: Self-pay | Admitting: Family Medicine

## 2015-05-07 VITALS — BP 133/66 | HR 53 | Temp 96.8°F | Ht 70.0 in | Wt 173.4 lb

## 2015-05-07 DIAGNOSIS — J449 Chronic obstructive pulmonary disease, unspecified: Secondary | ICD-10-CM

## 2015-05-07 DIAGNOSIS — R7989 Other specified abnormal findings of blood chemistry: Secondary | ICD-10-CM

## 2015-05-07 DIAGNOSIS — Z23 Encounter for immunization: Secondary | ICD-10-CM

## 2015-05-07 DIAGNOSIS — I10 Essential (primary) hypertension: Secondary | ICD-10-CM | POA: Diagnosis not present

## 2015-05-07 DIAGNOSIS — E291 Testicular hypofunction: Secondary | ICD-10-CM | POA: Diagnosis not present

## 2015-05-07 NOTE — Addendum Note (Signed)
Addended by: Jamelle Haring on: 05/07/2015 01:50 PM   Modules accepted: Miquel Dunn

## 2015-05-07 NOTE — Progress Notes (Signed)
   Subjective:    Patient ID: Patrick Novak, male    DOB: 1941-06-21, 74 y.o.   MRN: [id number]  HPI 74 year old gentleman here to follow-up hypertension. He also has some concerns about his inhaler as to whether he needs it or not. He has COPD and is on a steroid inhaler. I told him without an asthmatic component that it might not be necessary and I've asked him to withhold the medicine for a week to see if he notes any difference in his breathing. He also uses testosterone but infrequently. He wonders about the necessity of continuing that. There we can measure her level to see if he is therapeutic in terms of replacement. He does have a history of osteoporosis.  Patient Active Problem List   Diagnosis Date Noted  . COPD (chronic obstructive pulmonary disease) ([place])   . Osteopenia 08/20/2012  . 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 Hiatal hernia 08/20/2012  . Essential hypertension 07/21/2009  . BRADYCARDIA 07/21/2009  . DYSPNEA 07/21/2009   Outpatient Encounter Prescriptions as of 05/07/2015  Medication Sig  . ANDROGEL PUMP 20.25 MG/ACT (1.62%) GEL APPLY TWO PUMPS TOPICALLY ONCE DAILY IN THE MORNING  . aspirin 81 MG EC tablet Take 81 mg by mouth daily.    . Calcium Carbonate-Vitamin D 600-400 MG-UNIT per tablet Take 1 tablet by mouth 2 (two) times daily.    . formoterol (PERFOROMIST) 20 MCG/2ML nebulizer solution Take 20 mcg by nebulization 2 (two) times daily.  . meclizine (ANTIVERT) 12.5 MG tablet Take 1 tablet (12.5 mg total) by mouth 2 (two) times daily as needed.  . meloxicam (MOBIC) 15 MG tablet Take 1 tablet (15 mg total) by mouth daily.  . Multiple Vitamin (MULTIVITAMIN) tablet Take 1 tablet by mouth daily.    . ondansetron (ZOFRAN) 4 MG tablet Take 1 tablet (4 mg total) by mouth every 8 (eight) hours as needed for nausea or vomiting.  . [DISCONTINUED] amoxicillin-clavulanate (AUGMENTIN) 875-125 MG tablet Take 1 tablet by mouth 2 (two) times  daily. Take all of this medication  . [DISCONTINUED] benzonatate (TESSALON) 200 MG capsule Take 1 capsule (200 mg total) by mouth 3 (three) times daily as needed for cough.  . [DISCONTINUED] Testosterone (ANDROGEL) 20.25 MG/1.25GM (1.62%) GEL Place 2 Squirts onto the skin every morning.   No facility-administered encounter medications on file as of 05/07/2015.      Review of Systems  Constitutional: Negative.   HENT: Negative.   Respiratory: Negative.   Cardiovascular: Negative.   Genitourinary: Negative.   Neurological: Negative.   Psychiatric/Behavioral: Negative.        Objective:   Physical Exam  Constitutional: He is oriented to person, place, and time. He appears well-developed and well-nourished.  HENT:  Head: Normocephalic.  Cardiovascular: Normal rate and regular rhythm.   Pulmonary/Chest: Effort normal and breath sounds normal.  Musculoskeletal: Normal range of motion.  Neurological: He is alert and oriented to person, place, and time.          Assessment & Plan:  1. Essential hypertension Blood pressure well controlled at 133/66.  2. Chronic obstructive pulmonary disease, unspecified COPD type ([place]) Regarding use of formoterol, patient will discontinue for one week and note any difference in respiratory function and decide based on that whether to continue or not  3. Low testosterone I have suggested he come in for fasting testosterone level in the next couple of weeks.  Wardell Honour MD

## 2015-05-09 DIAGNOSIS — J449 Chronic obstructive pulmonary disease, unspecified: Secondary | ICD-10-CM | POA: Diagnosis not present

## 2015-05-09 DIAGNOSIS — J439 Emphysema, unspecified: Secondary | ICD-10-CM | POA: Diagnosis not present

## 2015-05-12 DIAGNOSIS — J439 Emphysema, unspecified: Secondary | ICD-10-CM | POA: Diagnosis not present

## 2015-05-12 DIAGNOSIS — J449 Chronic obstructive pulmonary disease, unspecified: Secondary | ICD-10-CM | POA: Diagnosis not present

## 2015-05-29 ENCOUNTER — Telehealth: Payer: Self-pay | Admitting: Family Medicine

## 2015-05-29 NOTE — Telephone Encounter (Signed)
Spoke to pt wife

## 2015-06-08 DIAGNOSIS — J439 Emphysema, unspecified: Secondary | ICD-10-CM | POA: Diagnosis not present

## 2015-06-08 DIAGNOSIS — J449 Chronic obstructive pulmonary disease, unspecified: Secondary | ICD-10-CM | POA: Diagnosis not present

## 2015-07-03 ENCOUNTER — Telehealth: Payer: Self-pay | Admitting: Family Medicine

## 2015-07-03 DIAGNOSIS — M542 Cervicalgia: Secondary | ICD-10-CM

## 2015-07-03 NOTE — Telephone Encounter (Signed)
Seen [facility] in past  -- needs referral due to insurance. Neck pain

## 2015-07-08 DIAGNOSIS — M50321 Other cervical disc degeneration at C4-C5 level: Secondary | ICD-10-CM | POA: Diagnosis not present

## 2015-07-08 DIAGNOSIS — M542 Cervicalgia: Secondary | ICD-10-CM | POA: Diagnosis not present

## 2015-07-09 DIAGNOSIS — J439 Emphysema, unspecified: Secondary | ICD-10-CM | POA: Diagnosis not present

## 2015-07-09 DIAGNOSIS — J449 Chronic obstructive pulmonary disease, unspecified: Secondary | ICD-10-CM | POA: Diagnosis not present

## 2015-07-15 DIAGNOSIS — J449 Chronic obstructive pulmonary disease, unspecified: Secondary | ICD-10-CM | POA: Diagnosis not present

## 2015-07-15 DIAGNOSIS — I10 Essential (primary) hypertension: Secondary | ICD-10-CM | POA: Diagnosis not present

## 2015-07-18 DIAGNOSIS — M542 Cervicalgia: Secondary | ICD-10-CM | POA: Diagnosis not present

## 2015-07-18 DIAGNOSIS — M4722 Other spondylosis with radiculopathy, cervical region: Secondary | ICD-10-CM | POA: Diagnosis not present

## 2015-08-04 ENCOUNTER — Other Ambulatory Visit: Payer: Self-pay | Admitting: Family Medicine

## 2015-08-05 ENCOUNTER — Encounter: Payer: Self-pay | Admitting: Family Medicine

## 2015-08-05 ENCOUNTER — Ambulatory Visit (INDEPENDENT_AMBULATORY_CARE_PROVIDER_SITE_OTHER): Payer: Medicare Other | Admitting: Family Medicine

## 2015-08-05 VITALS — BP 134/68 | HR 66 | Temp 96.8°F | Ht 70.0 in | Wt 175.0 lb

## 2015-08-05 DIAGNOSIS — L247 Irritant contact dermatitis due to plants, except food: Secondary | ICD-10-CM

## 2015-08-05 MED ORDER — PREDNISONE 20 MG PO TABS: ORAL_TABLET | ORAL | Status: DC

## 2015-08-05 MED ORDER — METHYLPREDNISOLONE ACETATE 80 MG/ML IJ SUSP
80.0000 mg | Freq: Once | INTRAMUSCULAR | Status: AC
  Administered 2015-08-05: 80 mg via INTRAMUSCULAR

## 2015-08-05 NOTE — Addendum Note (Signed)
Addended by: Ilean China on: 08/05/2015 03:00 PM   Modules accepted: Orders

## 2015-08-05 NOTE — Progress Notes (Signed)
   Subjective:    Patient ID: Patrick Novak, male    DOB: 27-Apr-1941, 75 y.o.   MRN: [id number]  HPI 75 year old gentleman with dermatitis. Over the years she's been very susceptible to rhus dermatitis.  He was in the woods last week and although he did not see the leaves the thought is he came in contact with [place] that had the resin. He broke out yesterday and there some on his face and arms and legs.  Patient Active Problem List   Diagnosis Date Noted  . COPD (chronic obstructive pulmonary disease) ([place])   . Osteopenia 08/20/2012  . 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 Hiatal hernia 08/20/2012  . Essential hypertension 07/21/2009  . BRADYCARDIA 07/21/2009  . DYSPNEA 07/21/2009   Outpatient Encounter Prescriptions as of 08/05/2015  Medication Sig  . aspirin 81 MG EC tablet Take 81 mg by mouth daily.    [name] BREO ELLIPTA 100-25 MCG/INH AEPB   . Calcium Carbonate-Vitamin D 600-400 MG-UNIT per tablet Take 1 tablet by mouth 2 (two) times daily.    . formoterol (PERFOROMIST) 20 MCG/2ML nebulizer solution Take 20 mcg by nebulization 2 (two) times daily.  . meclizine (ANTIVERT) 12.5 MG tablet TAKE ONE TABLET BY MOUTH TWICE DAILY AS NEEDED  . meloxicam (MOBIC) 15 MG tablet Take 1 tablet (15 mg total) by mouth daily.  . Multiple Vitamin (MULTIVITAMIN) tablet Take 1 tablet by mouth daily.    . ondansetron (ZOFRAN) 4 MG tablet Take 1 tablet (4 mg total) by mouth every 8 (eight) hours as needed for nausea or vomiting.  . ANDROGEL PUMP 20.25 MG/ACT (1.62%) GEL APPLY TWO PUMPS TOPICALLY ONCE DAILY IN THE MORNING (Patient not taking: Reported on 08/05/2015)   No facility-administered encounter medications on file as of 08/05/2015.      Review of Systems  Constitutional: Negative.   Respiratory: Negative.   Cardiovascular: Negative.   Skin: Positive for rash.       Objective:   Physical Exam  Constitutional: He appears well-developed and well-nourished.    Skin:  Rash consistent with contact dermatitis, presumably due to Rhus     BP 134/68 mmHg  Pulse 66  Temp(Src) 96.8 F (36 C) (Oral)  Ht 5\' 10"  (1.778 m)  Wt 175 lb (79.379 kg)  BMI 25.11 kg/m2      Assessment & Plan:  1. Contact dermatitis and eczema due to plant Discussed steroid injection versus pills. Explained natural history of 10-14 days and the fact that the injection may not suppress it for the total length of that time. Will provide pills to take if he has breakthrough dermatitis.

## 2015-08-12 ENCOUNTER — Ambulatory Visit (INDEPENDENT_AMBULATORY_CARE_PROVIDER_SITE_OTHER): Payer: Medicare Other | Admitting: Family Medicine

## 2015-08-12 ENCOUNTER — Encounter: Payer: Self-pay | Admitting: Family Medicine

## 2015-08-12 VITALS — BP 137/71 | HR 65 | Temp 96.8°F | Ht 70.0 in | Wt 175.0 lb

## 2015-08-12 DIAGNOSIS — L237 Allergic contact dermatitis due to plants, except food: Secondary | ICD-10-CM

## 2015-08-12 MED ORDER — METHYLPREDNISOLONE ACETATE 80 MG/ML IJ SUSP
40.0000 mg | Freq: Once | INTRAMUSCULAR | Status: AC
  Administered 2015-08-12: 40 mg via INTRAMUSCULAR

## 2015-08-12 NOTE — Progress Notes (Signed)
   Subjective:    Patient ID: Patrick Novak, male    DOB: [birth year], 75 y.o.   MRN: [id number]  HPI Patient here today for follow up on poison oak. He states he is not getting any better. He was given an injection of Depo-Medrol last week. He still has some new areas of sparse rash with itching. He was also given Atarax for itching      Patient Active Problem List   Diagnosis Date Noted  . COPD (chronic obstructive pulmonary disease) ([place])   . Osteopenia 08/20/2012  . 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 Hiatal hernia 08/20/2012  . Essential hypertension 07/21/2009  . BRADYCARDIA 07/21/2009  . DYSPNEA 07/21/2009   Outpatient Encounter Prescriptions as of 08/12/2015  Medication Sig  . ANDROGEL PUMP 20.25 MG/ACT (1.62%) GEL APPLY TWO PUMPS TOPICALLY ONCE DAILY IN THE MORNING  . aspirin 81 MG EC tablet Take 81 mg by mouth daily.    [name] BREO ELLIPTA 100-25 MCG/INH AEPB   . Calcium Carbonate-Vitamin D 600-400 MG-UNIT per tablet Take 1 tablet by mouth 2 (two) times daily.    . formoterol (PERFOROMIST) 20 MCG/2ML nebulizer solution Take 20 mcg by nebulization 2 (two) times daily.  . meclizine (ANTIVERT) 12.5 MG tablet TAKE ONE TABLET BY MOUTH TWICE DAILY AS NEEDED  . meloxicam (MOBIC) 15 MG tablet Take 1 tablet (15 mg total) by mouth daily.  . Multiple Vitamin (MULTIVITAMIN) tablet Take 1 tablet by mouth daily.    . ondansetron (ZOFRAN) 4 MG tablet Take 1 tablet (4 mg total) by mouth every 8 (eight) hours as needed for nausea or vomiting.  . predniSONE (DELTASONE) 20 MG tablet Take 3 pills 2 days 2 pills 2 days 1 pill 2 days (Patient not taking: Reported on 08/12/2015)   No facility-administered encounter medications on file as of 08/12/2015.      Review of Systems  Constitutional: Negative.   HENT: Negative.   Eyes: Negative.   Respiratory: Negative.   Cardiovascular: Negative.   Gastrointestinal: Negative.   Endocrine: Negative.     Genitourinary: Negative.   Musculoskeletal: Negative.   Skin: Positive for rash.  Allergic/Immunologic: Negative.   Neurological: Negative.   Hematological: Negative.   Psychiatric/Behavioral: Negative.        Objective:   Physical Exam  Constitutional: He appears well-developed and well-nourished.  Skin:  Sparse random areas on legs arms for head and chest of nonspecific dermatitis. There are some areas look like linear distribution consistent with rust dermatitis   BP 137/71 mmHg  Pulse 65  Temp(Src) 96.8 F (36 C) (Oral)  Ht 5\' 10"  (1.778 m)  Wt 175 lb (79.379 kg)  BMI 25.11 kg/m2        Assessment & Plan:  1. Poison oak Hopefully this will try up the rash. I would be reluctant to give him any more steroid as he has received 2 injections now. Continue with Atarax for pruritus - methylPREDNISolone acetate (DEPO-MEDROL) injection 40 mg; Inject 0.5 mLs (40 mg total) into the muscle once.  Wardell Honour MD

## 2015-08-21 ENCOUNTER — Ambulatory Visit (INDEPENDENT_AMBULATORY_CARE_PROVIDER_SITE_OTHER): Payer: Medicare Other | Admitting: Family Medicine

## 2015-08-21 ENCOUNTER — Encounter: Payer: Self-pay | Admitting: Family Medicine

## 2015-08-21 VITALS — BP 140/79 | HR 75 | Temp 96.8°F | Ht 70.0 in | Wt 176.0 lb

## 2015-08-21 DIAGNOSIS — J302 Other seasonal allergic rhinitis: Secondary | ICD-10-CM | POA: Diagnosis not present

## 2015-08-21 NOTE — Progress Notes (Signed)
   Subjective:    Patient ID: Patrick Novak, male    DOB: [birth year], 75 y.o.   MRN: [id number]  HPI Patient here today for sneezing, drainage and cough that started 2 days ago. Patient is helping his son got a house and been exposed to lots of dust. He also tells me his symptoms are worse on Wednesday days.      Patient Active Problem List   Diagnosis Date Noted  . COPD (chronic obstructive pulmonary disease) ([place])   . Osteopenia 08/20/2012  . 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 Hiatal hernia 08/20/2012  . Essential hypertension 07/21/2009  . BRADYCARDIA 07/21/2009  . DYSPNEA 07/21/2009   Outpatient Encounter Prescriptions as of 08/21/2015  Medication Sig  . ANDROGEL PUMP 20.25 MG/ACT (1.62%) GEL APPLY TWO PUMPS TOPICALLY ONCE DAILY IN THE MORNING  . aspirin 81 MG EC tablet Take 81 mg by mouth daily.    [name] BREO ELLIPTA 100-25 MCG/INH AEPB   . Calcium Carbonate-Vitamin D 600-400 MG-UNIT per tablet Take 1 tablet by mouth 2 (two) times daily.    . meclizine (ANTIVERT) 12.5 MG tablet TAKE ONE TABLET BY MOUTH TWICE DAILY AS NEEDED  . meloxicam (MOBIC) 15 MG tablet Take 1 tablet (15 mg total) by mouth daily.  . Multiple Vitamin (MULTIVITAMIN) tablet Take 1 tablet by mouth daily.    . [DISCONTINUED] formoterol (PERFOROMIST) 20 MCG/2ML nebulizer solution Take 20 mcg by nebulization 2 (two) times daily.  . [DISCONTINUED] ondansetron (ZOFRAN) 4 MG tablet Take 1 tablet (4 mg total) by mouth every 8 (eight) hours as needed for nausea or vomiting.  . [DISCONTINUED] predniSONE (DELTASONE) 20 MG tablet Take 3 pills 2 days 2 pills 2 days 1 pill 2 days (Patient not taking: Reported on 08/12/2015)   No facility-administered encounter medications on file as of 08/21/2015.     Review of Systems  Constitutional: Negative.  Negative for fever.  HENT: Positive for postnasal drip, sinus pressure and sneezing.   Eyes: Positive for discharge.  Respiratory: Positive  for cough.   Cardiovascular: Negative.   Gastrointestinal: Negative.   Endocrine: Negative.   Genitourinary: Negative.   Musculoskeletal: Negative.   Skin: Negative.   Allergic/Immunologic: Negative.   Neurological: Negative.   Hematological: Negative.   Psychiatric/Behavioral: Negative.        Objective:   Physical Exam  Constitutional: He appears well-developed and well-nourished.  HENT:  Mouth/Throat: Oropharynx is clear and moist.  Conjunctiva are clear. There is no drainage. Ears have cerumen blocking the external canals right greater than left. Sinuses are not tender to percussion.   BP 140/79 mmHg  Pulse 75  Temp(Src) 96.8 F (36 C) (Oral)  Ht 5\' 10"  (1.778 m)  Wt 176 lb (79.833 kg)  BMI 25.25 kg/m2        Assessment & Plan:  1. Other seasonal allergic rhinitis [place] are consistent with allergy. I have suggested OTC Allegra or Claritin. May take it twice a day for a few days while symptoms are at their worst. May also wear a mask when he is going to be and lots of dust. Also recommended Flonase once a day. Patient has had 2 recent injections of steroid. For that reason I'm reluctant to use it again today  Wardell Honour MD

## 2015-08-26 ENCOUNTER — Ambulatory Visit (INDEPENDENT_AMBULATORY_CARE_PROVIDER_SITE_OTHER): Payer: Medicare Other | Admitting: Family Medicine

## 2015-08-26 ENCOUNTER — Encounter: Payer: Self-pay | Admitting: Family Medicine

## 2015-08-26 VITALS — BP 112/65 | HR 68 | Temp 96.8°F | Ht 70.0 in | Wt 174.0 lb

## 2015-08-26 DIAGNOSIS — J09X2 Influenza due to identified novel influenza A virus with other respiratory manifestations: Secondary | ICD-10-CM | POA: Diagnosis not present

## 2015-08-26 MED ORDER — OSELTAMIVIR PHOSPHATE 75 MG PO CAPS: 75.0000 mg | ORAL_CAPSULE | Freq: Two times a day (BID) | ORAL | Status: DC

## 2015-08-26 NOTE — Progress Notes (Signed)
Primary Physician: Wardell Honour, MD  Chief Complaint: One-week history of cough and chest congestion myalgias no known fever. Patient was treated last week for some respiratory symptoms that sounded like he was probably having allergic rhinitis. His wife is here today with same symptoms and in fact tested positive for type A flu.     Past Medical History  Diagnosis Date  . Bradycardia     asympotmatic. Holter (2/11) with PACs, 1 run of 5 beats NSVT, HR range 41-125 with no pauses.   [name] GERD (gastroesophageal reflux disease)   . History of stomach cancer     surgery in 2001  . Vertigo   . Low testosterone   . HTN (hypertension)   . MR (mitral regurgitation)     echo 2/11: EF 60%, no regional wall abnormalities, mild MR, mild to moderate RV dialation, mild to moderate RV dysfunction   . 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 Osteopenia   . Vitamin D deficiency   . Hiatal hernia   . Gastric ulcer   . ED (erectile dysfunction)   . Vertigo   . COPD (chronic obstructive pulmonary disease) ([facility])      Home Meds: Prior to Admission medications   Medication Sig Start Date End Date Taking? Authorizing Provider  ANDROGEL PUMP 20.25 MG/ACT (1.62%) GEL APPLY TWO PUMPS TOPICALLY ONCE DAILY IN THE MORNING 04/17/15  Yes Wardell Honour, MD  aspirin 81 MG EC tablet Take 81 mg by mouth daily.     Yes Historical Provider, MD  BREO ELLIPTA 100-25 MCG/INH AEPB  07/25/15  Yes Historical Provider, MD  Calcium Carbonate-Vitamin D 600-400 MG-UNIT per tablet Take 1 tablet by mouth 2 (two) times daily.     Yes Historical Provider, MD  meclizine (ANTIVERT) 12.5 MG tablet TAKE ONE TABLET BY MOUTH TWICE DAILY AS NEEDED 08/04/15  Yes Wardell Honour, MD  meloxicam (MOBIC) 15 MG tablet Take 1 tablet (15 mg total) by mouth daily. 09/20/14  Yes Wardell Honour, MD  Multiple Vitamin (MULTIVITAMIN) tablet Take 1 tablet by mouth daily.     Yes Historical Provider, MD    Allergies:  Allergies  Allergen  Reactions  . Ciprofloxacin Nausea Only  . Codeine   . Doxycycline Nausea And Vomiting  . Zithromax [Azithromycin]     abd pain     Social History   Social History  . Marital Status: Married    Spouse Name: N/A  . Number of Children: N/A  . Years of Education: N/A   Occupational History  . Not on file.   Social History Main Topics  . Smoking status: Former Smoker -- 1.50 packs/day    Types: Cigarettes    Quit date: 08/21/1998  . Smokeless tobacco: Never Used     Comment: quit in 2000  . Alcohol Use: No  . Drug Use: No  . Sexual Activity: Not on file   Other Topics Concern  . Not on file   Social History Narrative   Married, lives in [place].   Retired Psychologist, sport and exercise, does some carpentry.      Review of Systems: Constitutional: negative for chills, fever, night sweats, weight changes, or fatigue  HEENT: , congestion, rhinorrhea,  Cardiovascular: negative for chest pain or palpitations Respiratory: cough Abdominal: negative for abdominal pain, nausea, vomiting, diarrhea, or constipation Dermatological: negative for rash Neurologic: negative for headache, dizziness, or syncope All other systems reviewed and are otherwise negative with the exception to those above  and in the HPI.   Physical Exam There were no vitals taken for this visit., There is no weight on file to calculate BMI. General: Well developed, well nourished, in no acute distress. Head: Normocephalic, atraumatic, eyes without discharge, sclera non-icteric, nares are without discharge. Bilateral auditory canals clear, TM's are without perforation, pearly grey and translucent with reflective cone of light bilaterally. Oral cavity moist, posterior pharynx without exudate, erythema, peritonsillar abscess, or post nasal drip.  Neck: Supple. No thyromegaly. Full ROM. No lymphadenopathy. Lungs: Clear bilaterally to auscultation without wheezes, rales, or rhonchi. Breathing is unlabored. Heart: RRR with S1 S2. No  murmurs, rubs, or gallops appreciated. Abdomen: Soft, non-tender, non-distended with normoactive bowel sounds. No hepatomegaly. No rebound/guarding. No obvious abdominal masses. Msk:  Strength and tone normal for age. Extremities/Skin: Warm and dry. No clubbing or cyanosis. No edema. No rashes or suspicious lesions. Neuro: Alert and oriented X 3. Moves all extremities spontaneously. Gait is normal. CNII-XII grossly in tact. Psych:  Responds to questions appropriately with a normal affect.   Labs:   ASSESSMENT AND PLAN:   1. Influenza due to identified novel influenza A virus with other respiratory manifestations Presumed flu since wife tested positive and symptoms are basically same. Tamiflu 75 mg twice a day for 5 days for cough take Hycodan. Note that he has had some codeine sensitivity in the past but no rash. Wardell Honour MD  08/26/2015 11:04 AM

## 2015-08-27 ENCOUNTER — Telehealth: Payer: Self-pay | Admitting: Family Medicine

## 2015-08-27 NOTE — Telephone Encounter (Signed)
Stp's wife and advised diarrhea is a common side effect and to continue taking the tamiflu but to treat it with otc medications. Pt voiced understanding, but states he isn't going to take the Tamiflu. Advised pt there is no other treatment for Flu but to just treat the symptoms and to stay hydrated. Pt states he isn't coming back down here.

## 2015-08-29 ENCOUNTER — Telehealth: Payer: Self-pay | Admitting: Nurse Practitioner

## 2015-08-29 ENCOUNTER — Ambulatory Visit (INDEPENDENT_AMBULATORY_CARE_PROVIDER_SITE_OTHER): Payer: Medicare Other | Admitting: Nurse Practitioner

## 2015-08-29 ENCOUNTER — Encounter: Payer: Self-pay | Admitting: Nurse Practitioner

## 2015-08-29 ENCOUNTER — Ambulatory Visit (INDEPENDENT_AMBULATORY_CARE_PROVIDER_SITE_OTHER): Payer: Medicare Other

## 2015-08-29 VITALS — BP 138/73 | HR 70 | Temp 96.8°F | Ht 70.0 in | Wt 173.0 lb

## 2015-08-29 DIAGNOSIS — R0989 Other specified symptoms and signs involving the circulatory and respiratory systems: Secondary | ICD-10-CM

## 2015-08-29 DIAGNOSIS — R05 Cough: Secondary | ICD-10-CM | POA: Diagnosis not present

## 2015-08-29 DIAGNOSIS — R059 Cough, unspecified: Secondary | ICD-10-CM

## 2015-08-29 NOTE — Patient Instructions (Signed)

## 2015-08-29 NOTE — Telephone Encounter (Signed)
appt made

## 2015-08-29 NOTE — Progress Notes (Signed)
   Subjective:    Patient ID: Patrick Novak, male    DOB: 11-May-1941, 75 y.o.   MRN: [id number]  HPI Patient in today c/o cough and congestion- he has a history of pneumonia and wanted to make sure he did not have. Slight cough but has COPD and always has a cough. Congestion.    Review of Systems  Constitutional: Positive for appetite change (down today). Negative for fever and chills.  HENT: Positive for congestion. Negative for ear pain, postnasal drip, sinus pressure, sore throat and trouble swallowing.   Respiratory: Positive for cough and shortness of breath (normal with COPD). Negative for wheezing.   Cardiovascular: Negative.   Genitourinary: Negative.   Neurological: Negative.   Psychiatric/Behavioral: Negative.   All other systems reviewed and are negative.      Objective:   Physical Exam  Constitutional: He is oriented to person, place, and time. He appears well-developed and well-nourished. No distress.  Cardiovascular: Normal rate, regular rhythm and normal heart sounds.   Pulmonary/Chest: Effort normal and breath sounds normal. No respiratory distress. He has no wheezes.  Neurological: He is alert and oriented to person, place, and time.  Skin: Skin is warm.  Psychiatric: He has a normal mood and affect. His behavior is normal. Judgment and thought content normal.   BP 138/73 mmHg  Pulse 70  Temp(Src) 96.8 F (36 C) (Oral)  Ht 5\' 10"  (1.778 m)  Wt 173 lb (78.472 kg)  BMI 24.82 kg/m2  Chest x ray- chronic bronchitic changes-Preliminary reading by Ronnald Collum, FNP  [facility]      Assessment & Plan:   1. Chest congestion   2. Cough    1. Take meds as prescribed 2. Use a cool mist humidifier especially during the winter months and when heat has been humid. 3. Use saline nose sprays frequently 4. Saline irrigations of the nose can be very helpful if done frequently.  * 4X daily for 1 week*  * Use of a nettie pot can be helpful with this. Follow directions with  this* 5. Drink plenty of fluids 6. Keep thermostat turn down low 7.For any cough or congestion  Use plain Mucinex- regular strength or max strength is fine   * Children- consult with Pharmacist for dosing 8. For fever or aces or pains- take tylenol or ibuprofen appropriate for age and weight.  * for fevers greater than 101 orally you may alternate ibuprofen and tylenol every  3 hours.   [name], FNP

## 2015-08-31 DIAGNOSIS — R05 Cough: Secondary | ICD-10-CM | POA: Diagnosis not present

## 2015-08-31 DIAGNOSIS — M8580 Other specified disorders of bone density and structure, unspecified site: Secondary | ICD-10-CM | POA: Diagnosis not present

## 2015-08-31 DIAGNOSIS — I10 Essential (primary) hypertension: Secondary | ICD-10-CM | POA: Diagnosis not present

## 2015-08-31 DIAGNOSIS — Z8572 Personal history of non-Hodgkin lymphomas: Secondary | ICD-10-CM | POA: Diagnosis not present

## 2015-08-31 DIAGNOSIS — Z87891 Personal history of nicotine dependence: Secondary | ICD-10-CM | POA: Diagnosis not present

## 2015-08-31 DIAGNOSIS — J441 Chronic obstructive pulmonary disease with (acute) exacerbation: Secondary | ICD-10-CM | POA: Diagnosis not present

## 2015-08-31 DIAGNOSIS — Z7982 Long term (current) use of aspirin: Secondary | ICD-10-CM | POA: Diagnosis not present

## 2015-08-31 DIAGNOSIS — R0902 Hypoxemia: Secondary | ICD-10-CM | POA: Diagnosis not present

## 2015-08-31 DIAGNOSIS — Z79899 Other long term (current) drug therapy: Secondary | ICD-10-CM | POA: Diagnosis not present

## 2015-08-31 DIAGNOSIS — Z85028 Personal history of other malignant neoplasm of stomach: Secondary | ICD-10-CM | POA: Diagnosis not present

## 2015-08-31 DIAGNOSIS — J439 Emphysema, unspecified: Secondary | ICD-10-CM | POA: Diagnosis not present

## 2015-09-03 ENCOUNTER — Telehealth: Payer: Self-pay | Admitting: *Deleted

## 2015-09-03 NOTE — Telephone Encounter (Signed)
Patient was given bactrim in the ER on Sunday and they gave him bactrim and it is making him nauseated and wants to know if we can change it.

## 2015-09-03 NOTE — Telephone Encounter (Signed)
Please let the provider that started this medicine decided what medicine needs to be given. He should hold the Bactrim cause of the nausea

## 2015-09-04 NOTE — Telephone Encounter (Signed)
I do not know this patient. Not sure why this was routed to me.

## 2015-09-05 NOTE — Telephone Encounter (Signed)
Please forward this information to the last provider in this office that saw the patient

## 2015-09-08 ENCOUNTER — Ambulatory Visit (INDEPENDENT_AMBULATORY_CARE_PROVIDER_SITE_OTHER): Payer: Medicare Other | Admitting: Family Medicine

## 2015-09-08 ENCOUNTER — Encounter: Payer: Self-pay | Admitting: Family Medicine

## 2015-09-08 VITALS — BP 141/79 | HR 64 | Temp 96.8°F | Ht 70.0 in | Wt 172.0 lb

## 2015-09-08 DIAGNOSIS — J441 Chronic obstructive pulmonary disease with (acute) exacerbation: Secondary | ICD-10-CM | POA: Diagnosis not present

## 2015-09-08 DIAGNOSIS — J449 Chronic obstructive pulmonary disease, unspecified: Secondary | ICD-10-CM | POA: Diagnosis not present

## 2015-09-08 MED ORDER — ONDANSETRON HCL 4 MG PO TABS: ORAL_TABLET | ORAL | Status: DC

## 2015-09-08 NOTE — Progress Notes (Signed)
Subjective:    Patient ID: DACARRI SURFACE, male    DOB: 04-06-1941, 75 y.o.   MRN: [id number]  HPI Patient here today for hospital follow up from [facility]. He was in the ER on 08/31/15 for cough and COPD.   While in the ER he was given steroids and antibiotic. He still feels weak and nauseated. He has been given steroids multiple times over the last month or 6 weeks. He also stopped his testosterone about 3 months ago. He did have hypoxia in the emergency room that improved with treatment. Mediastinal lymph nodes were noted on x-ray. That is worrisome given his history of non-Hodgkin's lymphoma and stomach cancer. Appetite remains good according to his wife. He has lost about 4 pounds in the last few weeks.   Patient Active Problem List   Diagnosis Date Noted  . COPD (chronic obstructive pulmonary disease) ([place])   . Osteopenia 08/20/2012  . 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 Hiatal hernia 08/20/2012  . Essential hypertension 07/21/2009  . BRADYCARDIA 07/21/2009  . DYSPNEA 07/21/2009   Outpatient Encounter Prescriptions as of 09/08/2015  Medication Sig  . ANDROGEL PUMP 20.25 MG/ACT (1.62%) GEL APPLY TWO PUMPS TOPICALLY ONCE DAILY IN THE MORNING  . aspirin 81 MG EC tablet Take 81 mg by mouth daily.    [name] BREO ELLIPTA 100-25 MCG/INH AEPB   . Calcium Carbonate-Vitamin D 600-400 MG-UNIT per tablet Take 1 tablet by mouth 2 (two) times daily.    . meclizine (ANTIVERT) 12.5 MG tablet TAKE ONE TABLET BY MOUTH TWICE DAILY AS NEEDED  . meloxicam (MOBIC) 15 MG tablet Take 1 tablet (15 mg total) by mouth daily.  . Multiple Vitamin (MULTIVITAMIN) tablet Take 1 tablet by mouth daily.    . [DISCONTINUED] oseltamivir (TAMIFLU) 75 MG capsule Take 1 capsule (75 mg total) by mouth 2 (two) times daily. (Patient not taking: Reported on 08/29/2015)   No facility-administered encounter medications on file as of 09/08/2015.      Review of Systems  Constitutional:  Positive for fatigue.  HENT: Positive for congestion.   Eyes: Negative.   Respiratory: Positive for cough.   Cardiovascular: Negative.   Gastrointestinal: Positive for nausea.  Endocrine: Negative.   Genitourinary: Negative.   Musculoskeletal: Negative.   Skin: Negative.   Allergic/Immunologic: Negative.   Neurological: Positive for light-headedness and headaches.  Hematological: Negative.   Psychiatric/Behavioral: Negative.        Objective:   Physical Exam  Constitutional: He is oriented to person, place, and time. He appears well-developed and well-nourished.  Cardiovascular: Normal rate and regular rhythm.   Pulmonary/Chest: Breath sounds normal. He has no wheezes.  Neurological: He is alert and oriented to person, place, and time.  Psychiatric: He has a normal mood and affect. His behavior is normal.   BP 141/79 mmHg  Pulse 64  Temp(Src) 96.8 F (36 C) (Oral)  Ht 5\' 10"  (1.778 m)  Wt 172 lb (78.019 kg)  BMI 24.68 kg/m2        Assessment & Plan:  1. Chronic obstructive pulmonary disease, unspecified COPD type ([place]) Hypoxia has resolved. He denies shortness of breath. He still has some cough productive of white sputum but overall his breathing has improved  2. COPD exacerbation ([place]) Patient has not recovered from exacerbation. I think there are multifactorial reasons why he is feeling bad including steroids and possible adrenal suppression. Stoppage of testosterone.. I encouraged him to try to move around a little  bit maybe even push himself. Continue some Mucinex to loosen up any congestion.

## 2015-09-16 ENCOUNTER — Ambulatory Visit: Payer: Medicare Other | Admitting: Nurse Practitioner

## 2015-10-13 ENCOUNTER — Other Ambulatory Visit: Payer: Self-pay | Admitting: Family Medicine

## 2015-10-31 ENCOUNTER — Other Ambulatory Visit: Payer: Medicare Other

## 2015-10-31 DIAGNOSIS — E291 Testicular hypofunction: Secondary | ICD-10-CM | POA: Diagnosis not present

## 2015-10-31 DIAGNOSIS — E349 Endocrine disorder, unspecified: Secondary | ICD-10-CM

## 2015-11-02 LAB — TESTOSTERONE,FREE AND TOTAL
Testosterone, Free: 3.3 pg/mL — ABNORMAL LOW (ref 6.6–18.1)
Testosterone: 353 ng/dL (ref 348–1197)

## 2015-11-21 ENCOUNTER — Other Ambulatory Visit: Payer: Self-pay | Admitting: Family Medicine

## 2015-11-24 NOTE — Telephone Encounter (Signed)
Last seen 09/08/15 Dr Sabra Heck  If approved route to nurse to call into [facility]

## 2016-01-14 DIAGNOSIS — J449 Chronic obstructive pulmonary disease, unspecified: Secondary | ICD-10-CM | POA: Diagnosis not present

## 2016-01-14 DIAGNOSIS — I10 Essential (primary) hypertension: Secondary | ICD-10-CM | POA: Diagnosis not present

## 2016-03-10 ENCOUNTER — Ambulatory Visit (INDEPENDENT_AMBULATORY_CARE_PROVIDER_SITE_OTHER): Payer: Medicare Other | Admitting: Pharmacist

## 2016-03-10 ENCOUNTER — Telehealth: Payer: Self-pay | Admitting: Family Medicine

## 2016-03-10 ENCOUNTER — Encounter: Payer: Self-pay | Admitting: Pharmacist

## 2016-03-10 VITALS — BP 142/72 | HR 64 | Ht 69.75 in | Wt 177.0 lb

## 2016-03-10 DIAGNOSIS — Z23 Encounter for immunization: Secondary | ICD-10-CM | POA: Diagnosis not present

## 2016-03-10 DIAGNOSIS — Z0000 Encounter for general adult medical examination without abnormal findings: Secondary | ICD-10-CM | POA: Diagnosis not present

## 2016-03-10 NOTE — Telephone Encounter (Signed)
Coupon left for patient at front desk.

## 2016-03-10 NOTE — Progress Notes (Signed)
Patient ID: Patrick Novak, male   DOB: 25-Feb-1941, 75 y.o.   MRN: [id number]     Subjective:   Patrick Novak is a 75 y.o. male who presents for a subsequent Medicare Annual Wellness Visit.   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    He continues to be active - golfing 2-3 times per week and gardening regularly.   Current Medications (verified) Outpatient Encounter Prescriptions as of 03/10/2016  Medication Sig  . ANDROGEL PUMP 20.25 MG/ACT (1.62%) GEL APPLY TWO PUMPS TOPICALLY ONCE DAILY IN THE MORNING (patient states that he sometimes will only use one pump because he is concerned about getting in Medicare coverage gap)  . aspirin 81 MG EC tablet Take 81 mg by mouth daily.    [name] BREO ELLIPTA 100-25 MCG/INH AEPB Inhale 1 puff into the lungs daily.   . Calcium Carbonate-Vitamin D 600-400 MG-UNIT per tablet Take 1 tablet by mouth daily.   . meclizine (ANTIVERT) 12.5 MG tablet TAKE ONE TABLET BY MOUTH TWICE DAILY AS NEEDED  . meloxicam (MOBIC) 15 MG tablet Take 1 tablet (15 mg total) by mouth daily. (Patient taking differently: Take 15 mg by mouth daily as needed. )  . Multiple Vitamin (MULTIVITAMIN) tablet Take 1 tablet by mouth daily.    . ondansetron (ZOFRAN) 4 MG tablet TAKE ONE-HALF TO ONE TABLET BY MOUTH TWICE DAILY AS NEEDED (Patient not taking: Reported on 03/10/2016)   No facility-administered encounter medications on file as of 03/10/2016.     Allergies (verified) Codeine; Ciprofloxacin; Doxycycline; Sulfa antibiotics; and Zithromax [azithromycin]   History: Past Medical History:  Diagnosis Date  . Bradycardia    asympotmatic. Holter (2/11) with PACs, 1 run of 5 beats NSVT, HR range 41-125 with no pauses.   . 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 COPD (chronic obstructive pulmonary disease) ([place])   . ED (erectile dysfunction)   . Gastric ulcer   . GERD (gastroesophageal  reflux disease)   . Hiatal hernia   . History of stomach cancer    surgery in 2001  . HTN (hypertension)   . Low testosterone   . MR (mitral regurgitation)    echo 2/11: EF 60%, no regional wall abnormalities, mild MR, mild to moderate RV dialation, mild to moderate RV dysfunction   . Osteopenia   . Vertigo   . Vertigo   . Vitamin D deficiency    Past Surgical History:  Procedure Laterality Date  . CHOLECYSTECTOMY  1995  . radical subtotal gastrectomy  09/1999   Family History  Problem Relation Age of Onset  . Stroke Father   . Heart disease Mother   . Stroke Brother   . Hip fracture Sister   . Cancer Sister     breast  . Heart disease Sister   . Hepatitis Sister   . Valvular heart disease Sister   . Alzheimer's disease Sister   . Lung disease Sister   . Early death Brother    Social History   Occupational History  . Not on file.   Social History Main Topics  . Smoking status: Former Smoker    Packs/day: 1.50    Types: Cigarettes    Quit date: 08/21/1998  . Smokeless tobacco: Never Used     Comment: quit in 2000  . Alcohol use No  . Drug use: No  . Sexual activity:  Yes    Do you feel safe at home?  Yes Are there smokers in your home (other than you)? No  Dietary issues and exercise activities discussed: Current Exercise Habits: The patient does not participate in regular exercise at present, Exercise limited by: respiratory conditions(s) (patient continues to golf 2-3 times per week.  He is not able to walk course)  Current Dietary habits:  No set diet.  Since gastric cancer he can only eat small meals.    Cardiac Risk Factors include: advanced age (>62men, >39 women);male gender;sedentary lifestyle  Objective:    Today's Vitals   03/10/16 1101  BP: (!) 145/78  Pulse: 64  Weight: 177 lb (80.3 kg)  Height: 5' 9.75" (1.772 m)  PainSc: 0-No pain   Body mass index is 25.58 kg/m.   Activities of Daily Living In your present state of health, do you  have any difficulty performing the following activities: 03/10/2016  Hearing? Y  Vision? N  Difficulty concentrating or making decisions? N  Walking or climbing stairs? N  Dressing or bathing? N  Doing errands, shopping? N  Preparing Food and eating ? N  Using the Toilet? N  In the past six months, have you accidently leaked urine? N  Do you have problems with loss of bowel control? N  Managing your Medications? N  Managing your Finances? N  Housekeeping or managing your Housekeeping? N  Some recent data might be hidden     Depression Screen PHQ 2/9 Scores 03/10/2016 09/08/2015 08/29/2015 08/21/2015  PHQ - 2 Score 0 0 0 0     Fall Risk Fall Risk  03/10/2016 09/08/2015 08/29/2015 08/21/2015 08/12/2015  Falls in the past year? No No No No No    Cognitive Function: MMSE - Mini Mental State Exam 09/20/2014  Orientation to time 5  Orientation to Place 5  Registration 3  Attention/ Calculation 5  Recall 3  Language- name 2 objects 2  Language- repeat 1  Language- follow 3 step command 3  Language- read & follow direction 1  Write a sentence 1  Copy design 1  Total score 30    Immunizations and Health Maintenance Immunization History  Administered Date(s) Administered  . DTaP 07/23/2002  . Influenza Whole 03/22/2012  . Influenza,inj,Quad PF,36+ Mos 03/27/2015  . Influenza-Unspecified 05/21/2014  . Pneumococcal Conjugate-13 05/07/2015  . Pneumococcal Polysaccharide-23 08/21/2006  . Zoster 08/20/2009   Health Maintenance Due  Topic Date Due  . INFLUENZA VACCINE  01/20/2016    Patient Care Team: Wardell Honour, MD as PCP - General (Family Medicine) Larey Dresser, MD as Consulting Physician (Cardiology) Lavonna Monarch, MD as Consulting Physician (Dermatology) Sinda Du, MD as Consulting Physician (Pulmonary Disease)  Indicate any recent Medical Services you may have received from other than [facility] providers in the past year (date may be approximate).    Assessment:      Annual Wellness Visit    Screening Tests Health Maintenance  Topic Date Due  . INFLUENZA VACCINE  01/20/2016  . DEXA SCAN  09/19/2016  . TETANUS/TDAP  11/02/2023  . ZOSTAVAX  Completed  . PNA vac Low Risk Adult  Addressed        Plan:   During the course of the visit Patrick Novak was educated and counseled about the following appropriate screening and preventive services:   Vaccines to include Pneumoccal, Influenza,  Td, Zostavax - influenza vaccines given today.    Colorectal cancer screening - colonoscopy due 2018.    Cardiovascular disease  screening - ECHO and EKG last 2011  Diabetes screening - needed  Bone Denisty / Osteoporosis Screening - UTD, next 09/2016  Glaucoma screening / Eye Exam - UTD  Nutrition counseling - eat a variety of fruits and vegetables.  Void high fat food and fried foods.  CHoose whole grains  Advanced Directives - information provided  Physical Activity - continue to be active every day  Recommended audiology referral - patient declined  Appt made with PCP Dr Sabra Heck - to have labs checked including BMET / glucose.  Consider spirometry.  Orders Placed This Encounter  Procedures  . Flu Vaccine QUAD 36+ mos IM    Goals    . Exercise 3x per week (30 min per time)          Continue to use stationary bike at least 3 times per week. Try to slowly increase the length of each exercise session.         Patient Instructions (the written plan) were given to the patient.   Cherre Robins, PharmD   03/10/2016

## 2016-03-10 NOTE — Patient Instructions (Addendum)
  Patrick Novak , Thank you for taking time to come for your Medicare Wellness Visit. I appreciate your ongoing commitment to your health goals. Please review the following plan we discussed and let me know if I can assist you in the future.   These are the goals we discussed: Continue to stay active - golfing and gardening.  If you change your mind about hearing assessment call our office - we can send referral  Increase non-starchy vegetables - carrots, green bean, squash, zucchini, tomatoes, onions, peppers, spinach and other green leafy vegetables, cabbage, lettuce, cucumbers, asparagus, okra (not fried), eggplant Limit sugar and processed foods (cakes, cookies, ice cream, crackers and chips) Increase fresh fruit but limit serving sizes 1/2 cup or about the size of tennis or baseball Limit red meat to no more than 1-2 times per week (serving size about the size of your palm) Choose whole grains / lean proteins - whole wheat bread, quinoa, whole grain rice (1/2 cup), fish, chicken, [place] Avoid sugar and calorie containing beverages - soda, sweet tea and juice.  Choose water or unsweetened tea instead.    This is a list of the screening recommended for you and due dates:  Health Maintenance  Topic Date Due  . DEXA scan (bone density measurement)  09/19/2016  . Tetanus Vaccine  11/02/2023  . Flu Shot  Addressed  . Shingles Vaccine  Completed  . Pneumonia vaccines  Addressed

## 2016-04-15 ENCOUNTER — Encounter: Payer: Self-pay | Admitting: *Deleted

## 2016-04-30 DIAGNOSIS — H04123 Dry eye syndrome of bilateral lacrimal glands: Secondary | ICD-10-CM | POA: Diagnosis not present

## 2016-05-06 DIAGNOSIS — H353122 Nonexudative age-related macular degeneration, left eye, intermediate dry stage: Secondary | ICD-10-CM | POA: Diagnosis not present

## 2016-05-06 DIAGNOSIS — H5211 Myopia, right eye: Secondary | ICD-10-CM | POA: Diagnosis not present

## 2016-05-06 DIAGNOSIS — H524 Presbyopia: Secondary | ICD-10-CM | POA: Diagnosis not present

## 2016-05-14 ENCOUNTER — Ambulatory Visit: Payer: Self-pay | Admitting: Family Medicine

## 2016-05-18 ENCOUNTER — Ambulatory Visit: Payer: Self-pay | Admitting: Family Medicine

## 2016-05-19 ENCOUNTER — Encounter (INDEPENDENT_AMBULATORY_CARE_PROVIDER_SITE_OTHER): Payer: Medicare Other | Admitting: Ophthalmology

## 2016-05-19 DIAGNOSIS — H2513 Age-related nuclear cataract, bilateral: Secondary | ICD-10-CM | POA: Diagnosis not present

## 2016-05-19 DIAGNOSIS — H353122 Nonexudative age-related macular degeneration, left eye, intermediate dry stage: Secondary | ICD-10-CM

## 2016-05-19 DIAGNOSIS — H43813 Vitreous degeneration, bilateral: Secondary | ICD-10-CM | POA: Diagnosis not present

## 2016-05-19 DIAGNOSIS — H353211 Exudative age-related macular degeneration, right eye, with active choroidal neovascularization: Secondary | ICD-10-CM

## 2016-05-27 NOTE — Progress Notes (Signed)
   Subjective:    Patient ID: Patrick Novak, male    DOB: Feb 21, 1941, 75 y.o.   MRN: [id number]  HPI 75 year old gentleman who is here to follow-up blood pressure and COPD. He tells me that he has seen eye doctor recently and is scheduled for macular degeneration injections as well as cataract removal. He is concerned about blood pressures. He was taken off blood pressure medicine but had been on lisinopril 10 mg.  Patient Active Problem List   Diagnosis Date Noted  . COPD (chronic obstructive pulmonary disease) ([place])   . Osteopenia 08/20/2012  . 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 Hiatal hernia 08/20/2012  . Essential hypertension 07/21/2009  . BRADYCARDIA 07/21/2009  . DYSPNEA 07/21/2009   Outpatient Encounter Prescriptions as of 05/28/2016  Medication Sig  . ANDROGEL PUMP 20.25 MG/ACT (1.62%) GEL APPLY TWO PUMPS TOPICALLY ONCE DAILY IN THE MORNING  . aspirin 81 MG EC tablet Take 81 mg by mouth daily.    [name] BREO ELLIPTA 100-25 MCG/INH AEPB Inhale 1 puff into the lungs daily.   . Calcium Carbonate-Vitamin D 600-400 MG-UNIT per tablet Take 1 tablet by mouth daily.   . meclizine (ANTIVERT) 12.5 MG tablet TAKE ONE TABLET BY MOUTH TWICE DAILY AS NEEDED  . meloxicam (MOBIC) 15 MG tablet Take 1 tablet (15 mg total) by mouth daily. (Patient taking differently: Take 15 mg by mouth daily as needed. )  . Multiple Vitamin (MULTIVITAMIN) tablet Take 1 tablet by mouth daily.    . ondansetron (ZOFRAN) 4 MG tablet TAKE ONE-HALF TO ONE TABLET BY MOUTH TWICE DAILY AS NEEDED   No facility-administered encounter medications on file as of 05/28/2016.       Review of Systems  Constitutional: Negative.   HENT: Negative.   Respiratory: Positive for shortness of breath.   Cardiovascular: Negative.   Genitourinary: Negative.   Neurological: Negative.        Objective:   Physical Exam  Constitutional: He is oriented to person, place, and time. He appears  well-developed and well-nourished.  HENT:  Wife complained that patient does not hear well. When checked with the tones of the otoscope there is significant loss in the right ear and left ear cannot hear high frequencies but he has a normal Rinne and Weber test  Pulmonary/Chest: Effort normal and breath sounds normal.  Neurological: He is alert and oriented to person, place, and time.   BP (!) 157/78 (BP Location: Right Arm, Patient Position: Sitting, Cuff Size: Normal)   Pulse 60   Temp 97 F (36.1 C) (Oral)   Ht 5' 9.75" (1.772 m)   Wt 178 lb 12.8 oz (81.1 kg)   BMI 25.84 kg/m         Assessment & Plan:  1. Essential hypertension Will add lisinopril at 5 mg dose back and ask him to follow blood pressures. My goal would be to get his systolic pressures in the 140s. Diastolic pressures are okay  2. Chronic obstructive pulmonary disease, unspecified COPD type ([place]) Continue to use Brio 1 puff daily. He does see pulmonologist for inhaler treatments  Wardell Honour MD

## 2016-05-28 ENCOUNTER — Encounter: Payer: Self-pay | Admitting: Family Medicine

## 2016-05-28 ENCOUNTER — Ambulatory Visit (INDEPENDENT_AMBULATORY_CARE_PROVIDER_SITE_OTHER): Payer: Medicare Other | Admitting: Family Medicine

## 2016-05-28 VITALS — BP 157/78 | HR 60 | Temp 97.0°F | Ht 69.75 in | Wt 178.8 lb

## 2016-05-28 DIAGNOSIS — I10 Essential (primary) hypertension: Secondary | ICD-10-CM

## 2016-05-28 DIAGNOSIS — J449 Chronic obstructive pulmonary disease, unspecified: Secondary | ICD-10-CM

## 2016-05-28 MED ORDER — LISINOPRIL 5 MG PO TABS: 5.0000 mg | ORAL_TABLET | Freq: Every day | ORAL | 0 refills | Status: DC

## 2016-06-03 ENCOUNTER — Encounter: Payer: Self-pay | Admitting: Family Medicine

## 2016-06-03 ENCOUNTER — Ambulatory Visit (INDEPENDENT_AMBULATORY_CARE_PROVIDER_SITE_OTHER): Payer: Medicare Other | Admitting: Family Medicine

## 2016-06-03 VITALS — BP 138/68 | HR 83 | Temp 98.1°F | Ht 69.75 in | Wt 179.0 lb

## 2016-06-03 DIAGNOSIS — J441 Chronic obstructive pulmonary disease with (acute) exacerbation: Secondary | ICD-10-CM | POA: Diagnosis not present

## 2016-06-03 MED ORDER — METHYLPREDNISOLONE ACETATE 80 MG/ML IJ SUSP
80.0000 mg | Freq: Once | INTRAMUSCULAR | Status: AC
  Administered 2016-06-03: 80 mg via INTRAMUSCULAR

## 2016-06-03 MED ORDER — AMOXICILLIN 500 MG PO CAPS: 500.0000 mg | ORAL_CAPSULE | Freq: Two times a day (BID) | ORAL | 0 refills | Status: DC

## 2016-06-03 NOTE — Progress Notes (Signed)
BP 138/68   Pulse 83   Temp 98.1 F (36.7 C) (Oral)   Ht 5' 9.75" (1.772 m)   Wt 179 lb (81.2 kg)   SpO2 96%   BMI 25.87 kg/m    Subjective:    Patient ID: Patrick Novak, male    DOB: 11-15-1940, 75 y.o.   MRN: [id number]  HPI: Patrick Novak is a 75 y.o. male presenting on 06/03/2016 for Cough (x 3 days); chest congestion; and Sinusitis (nasal congestion, sore throat)   HPI Cough and congestion and postnasal drainage and chest congestion Patient has been having 3 days worth of cough and chest congestion and sinus drainage and post nasal drainage. He was here last Friday and thinks he got it from a visit here. He denies any shortness of breath or wheezing currently but he says these things usually develop into that. He has been using his maintenance inhalers but has not needed his rescue inhaler for this episode. He denies any fevers or chills.  Relevant past medical, surgical, family and social history reviewed and updated as indicated. Interim medical history since our last visit reviewed. Allergies and medications reviewed and updated.  Review of Systems  Constitutional: Negative for chills and fever.  HENT: Positive for congestion, postnasal drip, rhinorrhea, sinus pressure and sore throat. Negative for ear discharge, ear pain, sneezing and voice change.   Eyes: Negative for pain, discharge, redness and visual disturbance.  Respiratory: Positive for cough. Negative for chest tightness, shortness of breath and wheezing.   Cardiovascular: Negative for chest pain and leg swelling.  Musculoskeletal: Negative for gait problem.  Skin: Negative for rash.  All other systems reviewed and are negative.   Per HPI unless specifically indicated above      Objective:    BP 138/68   Pulse 83   Temp 98.1 F (36.7 C) (Oral)   Ht 5' 9.75" (1.772 m)   Wt 179 lb (81.2 kg)   SpO2 96%   BMI 25.87 kg/m   Wt Readings from Last 3 Encounters:  06/03/16 179 lb (81.2 kg)  05/28/16 178 lb  12.8 oz (81.1 kg)  03/10/16 177 lb (80.3 kg)    Physical Exam  Constitutional: He is oriented to person, place, and time. He appears well-developed and well-nourished. No distress.  HENT:  Right Ear: Tympanic membrane, external ear and ear canal normal.  Left Ear: Tympanic membrane, external ear and ear canal normal.  Nose: Mucosal edema and rhinorrhea present. No sinus tenderness. No epistaxis. Right sinus exhibits no maxillary sinus tenderness and no frontal sinus tenderness. Left sinus exhibits no maxillary sinus tenderness and no frontal sinus tenderness.  Mouth/Throat: Uvula is midline and mucous membranes are normal. Posterior oropharyngeal edema and posterior oropharyngeal erythema present. No oropharyngeal exudate or tonsillar abscesses.  Eyes: Conjunctivae are normal. Right eye exhibits no discharge. Left eye exhibits no discharge. No scleral icterus.  Neck: Neck supple. No thyromegaly present.  Cardiovascular: Normal rate, regular rhythm, normal heart sounds and intact distal pulses.   No murmur heard. Pulmonary/Chest: Effort normal and breath sounds normal. No respiratory distress. He has no wheezes. He has no rales.  Musculoskeletal: Normal range of motion. He exhibits no edema.  Lymphadenopathy:    He has no cervical adenopathy.  Neurological: He is alert and oriented to person, place, and time. Coordination normal.  Skin: Skin is warm and dry. No rash noted. He is not diaphoretic.  Psychiatric: He has a normal mood and affect. His behavior is  normal.  Nursing note and vitals reviewed.      Assessment & Plan:   Problem List Items Addressed This Visit    None    Visit Diagnoses    COPD exacerbation ([place])    -  Primary   Relevant Medications   amoxicillin (AMOXIL) 500 MG capsule   methylPREDNISolone acetate (DEPO-MEDROL) injection 80 mg       Follow up plan: Return if symptoms worsen or fail to improve.  Counseling provided for all of the vaccine components No  orders of the defined types were placed in this encounter.   Caryl Pina, MD [facility] 06/03/2016, 2:30 PM

## 2016-06-08 ENCOUNTER — Other Ambulatory Visit: Payer: Self-pay | Admitting: Family Medicine

## 2016-06-09 ENCOUNTER — Encounter (INDEPENDENT_AMBULATORY_CARE_PROVIDER_SITE_OTHER): Payer: Medicare Other | Admitting: Ophthalmology

## 2016-06-09 DIAGNOSIS — H2513 Age-related nuclear cataract, bilateral: Secondary | ICD-10-CM | POA: Diagnosis not present

## 2016-06-09 DIAGNOSIS — H353211 Exudative age-related macular degeneration, right eye, with active choroidal neovascularization: Secondary | ICD-10-CM | POA: Diagnosis not present

## 2016-06-09 DIAGNOSIS — H353122 Nonexudative age-related macular degeneration, left eye, intermediate dry stage: Secondary | ICD-10-CM

## 2016-06-09 DIAGNOSIS — H43813 Vitreous degeneration, bilateral: Secondary | ICD-10-CM

## 2016-07-02 ENCOUNTER — Other Ambulatory Visit: Payer: Self-pay | Admitting: Family Medicine

## 2016-07-07 ENCOUNTER — Encounter (INDEPENDENT_AMBULATORY_CARE_PROVIDER_SITE_OTHER): Payer: Medicare Other | Admitting: Ophthalmology

## 2016-07-08 ENCOUNTER — Encounter (INDEPENDENT_AMBULATORY_CARE_PROVIDER_SITE_OTHER): Payer: Medicare Other | Admitting: Ophthalmology

## 2016-07-09 ENCOUNTER — Encounter (INDEPENDENT_AMBULATORY_CARE_PROVIDER_SITE_OTHER): Payer: Medicare Other | Admitting: Ophthalmology

## 2016-07-09 DIAGNOSIS — H43813 Vitreous degeneration, bilateral: Secondary | ICD-10-CM

## 2016-07-09 DIAGNOSIS — H353122 Nonexudative age-related macular degeneration, left eye, intermediate dry stage: Secondary | ICD-10-CM | POA: Diagnosis not present

## 2016-07-09 DIAGNOSIS — H2513 Age-related nuclear cataract, bilateral: Secondary | ICD-10-CM

## 2016-07-09 DIAGNOSIS — H353211 Exudative age-related macular degeneration, right eye, with active choroidal neovascularization: Secondary | ICD-10-CM

## 2016-08-05 ENCOUNTER — Encounter (INDEPENDENT_AMBULATORY_CARE_PROVIDER_SITE_OTHER): Payer: Medicare Other | Admitting: Ophthalmology

## 2016-08-05 DIAGNOSIS — H43813 Vitreous degeneration, bilateral: Secondary | ICD-10-CM | POA: Diagnosis not present

## 2016-08-05 DIAGNOSIS — H353122 Nonexudative age-related macular degeneration, left eye, intermediate dry stage: Secondary | ICD-10-CM | POA: Diagnosis not present

## 2016-08-05 DIAGNOSIS — H2513 Age-related nuclear cataract, bilateral: Secondary | ICD-10-CM | POA: Diagnosis not present

## 2016-08-05 DIAGNOSIS — H353211 Exudative age-related macular degeneration, right eye, with active choroidal neovascularization: Secondary | ICD-10-CM

## 2016-08-24 ENCOUNTER — Other Ambulatory Visit: Payer: Self-pay | Admitting: Family Medicine

## 2016-09-01 ENCOUNTER — Encounter (INDEPENDENT_AMBULATORY_CARE_PROVIDER_SITE_OTHER): Payer: Medicare Other | Admitting: Ophthalmology

## 2016-09-01 DIAGNOSIS — H2513 Age-related nuclear cataract, bilateral: Secondary | ICD-10-CM

## 2016-09-01 DIAGNOSIS — H43813 Vitreous degeneration, bilateral: Secondary | ICD-10-CM | POA: Diagnosis not present

## 2016-09-01 DIAGNOSIS — H353122 Nonexudative age-related macular degeneration, left eye, intermediate dry stage: Secondary | ICD-10-CM | POA: Diagnosis not present

## 2016-09-01 DIAGNOSIS — H353211 Exudative age-related macular degeneration, right eye, with active choroidal neovascularization: Secondary | ICD-10-CM

## 2016-10-06 ENCOUNTER — Encounter (INDEPENDENT_AMBULATORY_CARE_PROVIDER_SITE_OTHER): Payer: Medicare Other | Admitting: Ophthalmology

## 2016-10-06 DIAGNOSIS — H353211 Exudative age-related macular degeneration, right eye, with active choroidal neovascularization: Secondary | ICD-10-CM

## 2016-10-06 DIAGNOSIS — H2513 Age-related nuclear cataract, bilateral: Secondary | ICD-10-CM

## 2016-10-06 DIAGNOSIS — H43813 Vitreous degeneration, bilateral: Secondary | ICD-10-CM

## 2016-10-06 DIAGNOSIS — H353122 Nonexudative age-related macular degeneration, left eye, intermediate dry stage: Secondary | ICD-10-CM | POA: Diagnosis not present

## 2016-10-27 DIAGNOSIS — H2513 Age-related nuclear cataract, bilateral: Secondary | ICD-10-CM | POA: Diagnosis not present

## 2016-10-27 DIAGNOSIS — H353211 Exudative age-related macular degeneration, right eye, with active choroidal neovascularization: Secondary | ICD-10-CM | POA: Diagnosis not present

## 2016-10-27 DIAGNOSIS — H353122 Nonexudative age-related macular degeneration, left eye, intermediate dry stage: Secondary | ICD-10-CM | POA: Diagnosis not present

## 2016-10-27 DIAGNOSIS — H2511 Age-related nuclear cataract, right eye: Secondary | ICD-10-CM | POA: Diagnosis not present

## 2016-10-27 DIAGNOSIS — H25013 Cortical age-related cataract, bilateral: Secondary | ICD-10-CM | POA: Diagnosis not present

## 2016-11-03 ENCOUNTER — Encounter (INDEPENDENT_AMBULATORY_CARE_PROVIDER_SITE_OTHER): Payer: Medicare Other | Admitting: Ophthalmology

## 2016-11-03 DIAGNOSIS — H353211 Exudative age-related macular degeneration, right eye, with active choroidal neovascularization: Secondary | ICD-10-CM | POA: Diagnosis not present

## 2016-11-03 DIAGNOSIS — H353122 Nonexudative age-related macular degeneration, left eye, intermediate dry stage: Secondary | ICD-10-CM

## 2016-11-03 DIAGNOSIS — H43813 Vitreous degeneration, bilateral: Secondary | ICD-10-CM | POA: Diagnosis not present

## 2016-11-23 DIAGNOSIS — H25031 Anterior subcapsular polar age-related cataract, right eye: Secondary | ICD-10-CM | POA: Diagnosis not present

## 2016-11-23 DIAGNOSIS — H2511 Age-related nuclear cataract, right eye: Secondary | ICD-10-CM | POA: Diagnosis not present

## 2016-11-23 DIAGNOSIS — H25011 Cortical age-related cataract, right eye: Secondary | ICD-10-CM | POA: Diagnosis not present

## 2016-11-25 ENCOUNTER — Other Ambulatory Visit: Payer: Self-pay | Admitting: Family Medicine

## 2016-11-25 MED ORDER — ONDANSETRON HCL 4 MG PO TABS: ORAL_TABLET | ORAL | 0 refills | Status: DC

## 2016-11-25 NOTE — Telephone Encounter (Signed)
Go ahead and refill ondansetron

## 2016-11-25 NOTE — Telephone Encounter (Signed)
[facility] pt Last seen 05/2016 Please advise

## 2016-11-25 NOTE — Telephone Encounter (Signed)
Prescription sent in, patient aware. 

## 2016-11-27 ENCOUNTER — Other Ambulatory Visit: Payer: Self-pay | Admitting: Family Medicine

## 2016-12-01 ENCOUNTER — Encounter (INDEPENDENT_AMBULATORY_CARE_PROVIDER_SITE_OTHER): Payer: Medicare Other | Admitting: Ophthalmology

## 2016-12-01 DIAGNOSIS — H353122 Nonexudative age-related macular degeneration, left eye, intermediate dry stage: Secondary | ICD-10-CM

## 2016-12-01 DIAGNOSIS — H353211 Exudative age-related macular degeneration, right eye, with active choroidal neovascularization: Secondary | ICD-10-CM

## 2016-12-01 DIAGNOSIS — H43813 Vitreous degeneration, bilateral: Secondary | ICD-10-CM | POA: Diagnosis not present

## 2016-12-01 DIAGNOSIS — H2512 Age-related nuclear cataract, left eye: Secondary | ICD-10-CM

## 2016-12-08 ENCOUNTER — Encounter: Payer: Self-pay | Admitting: Family Medicine

## 2016-12-08 ENCOUNTER — Ambulatory Visit (INDEPENDENT_AMBULATORY_CARE_PROVIDER_SITE_OTHER): Payer: Medicare Other | Admitting: Family Medicine

## 2016-12-08 ENCOUNTER — Ambulatory Visit (INDEPENDENT_AMBULATORY_CARE_PROVIDER_SITE_OTHER): Payer: Medicare Other

## 2016-12-08 VITALS — BP 125/58 | HR 61 | Temp 96.7°F | Ht 69.75 in | Wt 174.0 lb

## 2016-12-08 DIAGNOSIS — I10 Essential (primary) hypertension: Secondary | ICD-10-CM

## 2016-12-08 DIAGNOSIS — M810 Age-related osteoporosis without current pathological fracture: Secondary | ICD-10-CM | POA: Diagnosis not present

## 2016-12-08 DIAGNOSIS — R7989 Other specified abnormal findings of blood chemistry: Secondary | ICD-10-CM

## 2016-12-08 DIAGNOSIS — K449 Diaphragmatic hernia without obstruction or gangrene: Secondary | ICD-10-CM | POA: Diagnosis not present

## 2016-12-08 DIAGNOSIS — J449 Chronic obstructive pulmonary disease, unspecified: Secondary | ICD-10-CM | POA: Diagnosis not present

## 2016-12-08 MED ORDER — FLUTICASONE FUROATE-VILANTEROL 200-25 MCG/INH IN AEPB: 1.0000 | INHALATION_SPRAY | Freq: Every day | RESPIRATORY_TRACT | 11 refills | Status: DC

## 2016-12-08 NOTE — Progress Notes (Signed)
Subjective:  Patient ID: SHAHZAIN KIESTER, male    DOB: 1941/01/04  Age: 76 y.o. MRN: [id number]  CC: Hypertension (pt here today for routine follow up of his chronic medical conditions.)   HPI ABHIRAM CRIADO presents for  follow-up of hypertension. Patient has no history of headache chest pain or recent cough. Patient also denies symptoms of TIA such as numbness weakness lateralizing. Patient checks  blood pressure at home and has not had any elevated readings recently. Patient denies side effects from medication. States taking it regularly.  Patient also states that he is using the [facility] but his symptoms seem to be getting worse. More short of breath with less activity. Long-term smoker stopped around age 36. Estimated 60-pack-year history of smoking. Denies chronic cough. No orthopnea.  Patient was noted in the past to have osteoporosis. Because of this he had a testosterone level which was low. He was started on AndroGel. He says he's been taking it 15-20 years. He is concerned about risk of blood clot but also of osteoporosis worsening as well as loss of energy well-being etc. if he discontinues the medicine and is very interested in continuing if at all possible. He is willing to take aspirin daily.   History Selso has a past medical history of Bradycardia; Cancer [facility]); COPD (chronic obstructive pulmonary disease) ([place]); ED (erectile dysfunction); Gastric ulcer; GERD (gastroesophageal reflux disease); Hiatal hernia; History of stomach cancer; HTN (hypertension); Low testosterone; MR (mitral regurgitation); Osteopenia; Vertigo; Vertigo; and Vitamin D deficiency.   He has a past surgical history that includes Cholecystectomy (1995) and radical subtotal gastrectomy (09/1999).   His family history includes Alzheimer's disease in his sister; Cancer in his sister; Early death in his brother; Heart disease in his mother and sister; Hepatitis in his sister; Hip fracture in his sister; Lung disease in his  sister; Stroke in his brother and father; Valvular heart disease in his sister.He reports that he quit smoking about 18 years ago. His smoking use included Cigarettes. He smoked 1.50 packs per day. He has never used smokeless tobacco. He reports that he does not drink alcohol or use drugs.  Current Outpatient Prescriptions on File Prior to Visit  Medication Sig Dispense Refill  . ANDROGEL PUMP 20.25 MG/ACT (1.62%) GEL APPLY TWO PUMPS TOPICALLY ONCE DAILY IN THE MORNING 75 g 1  . aspirin 81 MG EC tablet Take 81 mg by mouth daily.      . Calcium Carbonate-Vitamin D 600-400 MG-UNIT per tablet Take 1 tablet by mouth daily.     [name] lisinopril (PRINIVIL,ZESTRIL) 5 MG tablet TAKE 1 TABLET BY MOUTH ONCE DAILY 90 tablet 0  . meclizine (ANTIVERT) 12.5 MG tablet TAKE ONE TABLET BY MOUTH TWICE DAILY AS NEEDED 30 tablet 2  . meloxicam (MOBIC) 15 MG tablet Take 1 tablet (15 mg total) by mouth daily. (Patient taking differently: Take 15 mg by mouth daily as needed. ) 30 tablet 2  . Multiple Vitamin (MULTIVITAMIN) tablet Take 1 tablet by mouth daily.      . ondansetron (ZOFRAN) 4 MG tablet TAKE ONE-HALF TO ONE TABLET BY MOUTH TWICE DAILY AS NEEDED 12 tablet 0   No current facility-administered medications on file prior to visit.     ROS Review of Systems  Constitutional: Negative for chills, diaphoresis, fever and unexpected weight change.  HENT: Negative for congestion, hearing loss, rhinorrhea and sore throat.   Eyes: Negative for visual disturbance.  Respiratory: Negative for cough and shortness of breath.  Cardiovascular: Negative for chest pain.  Gastrointestinal: Negative for abdominal pain, constipation and diarrhea.  Genitourinary: Negative for dysuria and flank pain.  Musculoskeletal: Negative for arthralgias and joint swelling.  Skin: Negative for rash.  Neurological: Negative for dizziness and headaches.  Psychiatric/Behavioral: Negative for dysphoric mood and sleep disturbance.     Objective:  BP (!) 125/58   Pulse 61   Temp (!) 96.7 F (35.9 C) (Oral)   Ht 5' 9.75" (1.772 m)   Wt 174 lb (78.9 kg)   BMI 25.15 kg/m   BP Readings from Last 3 Encounters:  12/08/16 (!) 125/58  06/03/16 138/68  05/28/16 (!) 157/78    Wt Readings from Last 3 Encounters:  12/08/16 174 lb (78.9 kg)  06/03/16 179 lb (81.2 kg)  05/28/16 178 lb 12.8 oz (81.1 kg)     Physical Exam  Constitutional: He is oriented to person, place, and time. He appears well-developed and well-nourished. No distress.  HENT:  Head: Normocephalic and atraumatic.  Right Ear: External ear normal.  Left Ear: External ear normal.  Nose: Nose normal.  Mouth/Throat: Oropharynx is clear and moist.  Eyes: Conjunctivae and EOM are normal. Pupils are equal, round, and reactive to light.  Neck: Normal range of motion. Neck supple. No thyromegaly present.  Cardiovascular: Normal rate, regular rhythm and normal heart sounds.   No murmur heard. Pulmonary/Chest: Effort normal and breath sounds normal. No respiratory distress. He has no wheezes. He has no rales.  Abdominal: Soft. Bowel sounds are normal. He exhibits no distension. There is no tenderness.  Lymphadenopathy:    He has no cervical adenopathy.  Neurological: He is alert and oriented to person, place, and time. He has normal reflexes.  Skin: Skin is warm and dry.  Psychiatric: He has a normal mood and affect. His behavior is normal. Judgment and thought content normal.     Lab Results  Component Value Date   WBC 7.9 07/22/2014   HGB 15.1 07/22/2014   HCT 47.5 07/22/2014   GLUCOSE 118 (H) 10/03/2014   CHOL 105 08/01/2014   TRIG 128 08/01/2014   HDL 47 08/01/2014   LDLCALC 32 08/01/2014   ALT 17 08/01/2014   AST 15 08/01/2014   NA 141 10/03/2014   K 4.1 10/03/2014   CL 102 10/03/2014   CREATININE 1.13 10/03/2014   BUN 18 10/03/2014   CO2 25 10/03/2014   PSA 1.33 09/08/2012    No results found.  Assessment & Plan:   Sinan was  seen today for hypertension.  Diagnoses and all orders for this visit:  Essential hypertension -     CMP14+EGFR -     Lipid panel -     Vitamin B12  Low testosterone -     CMP14+EGFR -     Lipid panel -     Vitamin B12 -     Testosterone,Free and Total -     Cancel: DG Bone Density; Future -     DG [facility] DEXA; Future  Chronic obstructive pulmonary disease, unspecified COPD type ([place]) -     CMP14+EGFR -     PR BREATHING CAPACITY TEST -     DG Chest 2 View; Future  Hiatal hernia -     CBC with Differential/Platelet -     CMP14+EGFR  Age-related osteoporosis without current pathological fracture -     CMP14+EGFR -     Vitamin B12 -     VITAMIN D 25 Hydroxy (Vit-D Deficiency, Fractures) -  Testosterone,Free and Total -     Cancel: DG Bone Density; Future -     DG [facility] DEXA; Future  Other orders -     fluticasone furoate-vilanterol (BREO ELLIPTA) 200-25 MCG/INH AEPB; Inhale 1 puff into the lungs daily.   I have discontinued Mr. Osmany Azer ELLIPTA and amoxicillin. I am also having him start on fluticasone furoate-vilanterol. Additionally, I am having him maintain his aspirin, Calcium Carbonate-Vitamin D, multivitamin, meloxicam, meclizine, ANDROGEL PUMP, ondansetron, and lisinopril.  Meds ordered this encounter  Medications  . fluticasone furoate-vilanterol (BREO ELLIPTA) 200-25 MCG/INH AEPB    Sig: Inhale 1 puff into the lungs daily.    Dispense:  1 each    Refill:  11   Pulmonary function shows moderately severe obstruction. Chest x-ray confirms significant COPD. Increased strength of Breo prescription submitted to pharmacy.   Follow-up: Return in about 6 months (around 06/09/2017).  Claretta Fraise, M.D.

## 2016-12-09 LAB — CBC WITH DIFFERENTIAL/PLATELET
BASOS: 0 %
Basophils Absolute: 0 10*3/uL (ref 0.0–0.2)
EOS (ABSOLUTE): 0.1 10*3/uL (ref 0.0–0.4)
EOS: 1 %
HEMATOCRIT: 47.9 % (ref 37.5–51.0)
Hemoglobin: 15.8 g/dL (ref 13.0–17.7)
IMMATURE GRANULOCYTES: 0 %
Immature Grans (Abs): 0 10*3/uL (ref 0.0–0.1)
LYMPHS ABS: 1.8 10*3/uL (ref 0.7–3.1)
Lymphs: 23 %
MCH: 31.9 pg (ref 26.6–33.0)
MCHC: 33 g/dL (ref 31.5–35.7)
MCV: 97 fL (ref 79–97)
Monocytes Absolute: 0.6 10*3/uL (ref 0.1–0.9)
Monocytes: 7 %
NEUTROS ABS: 5.5 10*3/uL (ref 1.4–7.0)
Neutrophils: 69 %
Platelets: 256 10*3/uL (ref 150–379)
RBC: 4.95 x10E6/uL (ref 4.14–5.80)
RDW: 13.1 % (ref 12.3–15.4)
WBC: 8 10*3/uL (ref 3.4–10.8)

## 2016-12-09 LAB — CMP14+EGFR
A/G RATIO: 1.7 (ref 1.2–2.2)
ALBUMIN: 4.3 g/dL (ref 3.5–4.8)
ALT: 17 IU/L (ref 0–44)
AST: 22 IU/L (ref 0–40)
Alkaline Phosphatase: 129 IU/L — ABNORMAL HIGH (ref 39–117)
BUN / CREAT RATIO: 13 (ref 10–24)
BUN: 15 mg/dL (ref 8–27)
Bilirubin Total: 0.9 mg/dL (ref 0.0–1.2)
CALCIUM: 9.6 mg/dL (ref 8.6–10.2)
CO2: 26 mmol/L (ref 20–29)
CREATININE: 1.2 mg/dL (ref 0.76–1.27)
Chloride: 101 mmol/L (ref 96–106)
GFR, EST AFRICAN AMERICAN: 67 mL/min/{1.73_m2} (ref >59)
GFR, EST NON AFRICAN AMERICAN: 58 mL/min/{1.73_m2} — AB (ref >59)
GLOBULIN, TOTAL: 2.5 g/dL (ref 1.5–4.5)
Glucose: 78 mg/dL (ref 65–99)
POTASSIUM: 4.9 mmol/L (ref 3.5–5.2)
SODIUM: 142 mmol/L (ref 134–144)
Total Protein: 6.8 g/dL (ref 6.0–8.5)

## 2016-12-09 LAB — TESTOSTERONE,FREE AND TOTAL
TESTOSTERONE FREE: 5.7 pg/mL — AB (ref 6.6–18.1)
TESTOSTERONE: 502 ng/dL (ref 264–916)

## 2016-12-09 LAB — VITAMIN D 25 HYDROXY (VIT D DEFICIENCY, FRACTURES): Vit D, 25-Hydroxy: 39.6 ng/mL (ref 30.0–100.0)

## 2016-12-09 LAB — LIPID PANEL
CHOL/HDL RATIO: 3.2 ratio (ref 0.0–5.0)
Cholesterol, Total: 142 mg/dL (ref 100–199)
HDL: 44 mg/dL (ref >39)
LDL Calculated: 80 mg/dL (ref 0–99)
Triglycerides: 91 mg/dL (ref 0–149)
VLDL Cholesterol Cal: 18 mg/dL (ref 5–40)

## 2016-12-09 LAB — VITAMIN B12: VITAMIN B 12: 510 pg/mL (ref 232–1245)

## 2016-12-13 LAB — CMP14+EGFR

## 2016-12-13 LAB — CBC WITH DIFFERENTIAL/PLATELET

## 2016-12-13 LAB — LIPID PANEL

## 2016-12-13 LAB — VITAMIN D 25 HYDROXY (VIT D DEFICIENCY, FRACTURES)

## 2016-12-13 LAB — TESTOSTERONE,FREE AND TOTAL: Testosterone: 716 ng/dL (ref 264–916)

## 2016-12-13 LAB — VITAMIN B12: Vitamin B-12: 790 pg/mL (ref 232–1245)

## 2016-12-29 ENCOUNTER — Encounter (INDEPENDENT_AMBULATORY_CARE_PROVIDER_SITE_OTHER): Payer: Medicare Other | Admitting: Ophthalmology

## 2016-12-29 DIAGNOSIS — H43813 Vitreous degeneration, bilateral: Secondary | ICD-10-CM | POA: Diagnosis not present

## 2016-12-29 DIAGNOSIS — H2512 Age-related nuclear cataract, left eye: Secondary | ICD-10-CM | POA: Diagnosis not present

## 2016-12-29 DIAGNOSIS — H353211 Exudative age-related macular degeneration, right eye, with active choroidal neovascularization: Secondary | ICD-10-CM | POA: Diagnosis not present

## 2016-12-29 DIAGNOSIS — H353122 Nonexudative age-related macular degeneration, left eye, intermediate dry stage: Secondary | ICD-10-CM | POA: Diagnosis not present

## 2017-01-05 DIAGNOSIS — H2512 Age-related nuclear cataract, left eye: Secondary | ICD-10-CM | POA: Diagnosis not present

## 2017-01-05 DIAGNOSIS — H25012 Cortical age-related cataract, left eye: Secondary | ICD-10-CM | POA: Diagnosis not present

## 2017-01-07 ENCOUNTER — Other Ambulatory Visit: Payer: Self-pay | Admitting: Family Medicine

## 2017-01-11 DIAGNOSIS — H2512 Age-related nuclear cataract, left eye: Secondary | ICD-10-CM | POA: Diagnosis not present

## 2017-01-11 DIAGNOSIS — H25812 Combined forms of age-related cataract, left eye: Secondary | ICD-10-CM | POA: Diagnosis not present

## 2017-01-13 DIAGNOSIS — I10 Essential (primary) hypertension: Secondary | ICD-10-CM | POA: Diagnosis not present

## 2017-01-13 DIAGNOSIS — J449 Chronic obstructive pulmonary disease, unspecified: Secondary | ICD-10-CM | POA: Diagnosis not present

## 2017-01-13 DIAGNOSIS — M1990 Unspecified osteoarthritis, unspecified site: Secondary | ICD-10-CM | POA: Diagnosis not present

## 2017-01-13 DIAGNOSIS — R252 Cramp and spasm: Secondary | ICD-10-CM | POA: Diagnosis not present

## 2017-01-26 ENCOUNTER — Encounter (INDEPENDENT_AMBULATORY_CARE_PROVIDER_SITE_OTHER): Payer: Medicare Other | Admitting: Ophthalmology

## 2017-01-26 DIAGNOSIS — H353122 Nonexudative age-related macular degeneration, left eye, intermediate dry stage: Secondary | ICD-10-CM | POA: Diagnosis not present

## 2017-01-26 DIAGNOSIS — H43813 Vitreous degeneration, bilateral: Secondary | ICD-10-CM

## 2017-01-26 DIAGNOSIS — H353211 Exudative age-related macular degeneration, right eye, with active choroidal neovascularization: Secondary | ICD-10-CM

## 2017-01-27 NOTE — Progress Notes (Signed)
Patient aware.

## 2017-02-02 DIAGNOSIS — Z961 Presence of intraocular lens: Secondary | ICD-10-CM | POA: Diagnosis not present

## 2017-02-10 ENCOUNTER — Encounter: Payer: Self-pay | Admitting: *Deleted

## 2017-02-14 ENCOUNTER — Encounter: Payer: Self-pay | Admitting: *Deleted

## 2017-02-23 ENCOUNTER — Encounter (INDEPENDENT_AMBULATORY_CARE_PROVIDER_SITE_OTHER): Payer: Medicare Other | Admitting: Ophthalmology

## 2017-02-23 DIAGNOSIS — H353122 Nonexudative age-related macular degeneration, left eye, intermediate dry stage: Secondary | ICD-10-CM

## 2017-02-23 DIAGNOSIS — H43813 Vitreous degeneration, bilateral: Secondary | ICD-10-CM

## 2017-02-23 DIAGNOSIS — H353211 Exudative age-related macular degeneration, right eye, with active choroidal neovascularization: Secondary | ICD-10-CM

## 2017-02-25 ENCOUNTER — Other Ambulatory Visit: Payer: Self-pay | Admitting: *Deleted

## 2017-02-25 MED ORDER — LISINOPRIL 5 MG PO TABS: 5.0000 mg | ORAL_TABLET | Freq: Every day | ORAL | 1 refills | Status: DC

## 2017-03-16 ENCOUNTER — Other Ambulatory Visit: Payer: Self-pay | Admitting: *Deleted

## 2017-03-16 MED ORDER — MECLIZINE HCL 12.5 MG PO TABS: 12.5000 mg | ORAL_TABLET | Freq: Two times a day (BID) | ORAL | 0 refills | Status: DC | PRN

## 2017-03-17 ENCOUNTER — Encounter: Payer: Self-pay | Admitting: *Deleted

## 2017-03-17 ENCOUNTER — Ambulatory Visit (INDEPENDENT_AMBULATORY_CARE_PROVIDER_SITE_OTHER): Payer: Medicare Other | Admitting: *Deleted

## 2017-03-17 DIAGNOSIS — Z23 Encounter for immunization: Secondary | ICD-10-CM

## 2017-03-17 DIAGNOSIS — Z0000 Encounter for general adult medical examination without abnormal findings: Secondary | ICD-10-CM

## 2017-03-17 NOTE — Patient Instructions (Signed)
  Mr. Patrick Novak , Thank you for taking time to come for your Medicare Wellness Visit. I appreciate your ongoing commitment to your health goals. Please review the following plan we discussed and let me know if I can assist you in the future.   These are the goals we discussed:  Keep up the good work with your health diet of mostly lean proteins, vegetables, fruits, and whole grains, and your active lifestyle.  You received your flu vaccine today.  Please review the information given on Advanced Directives, if you decide to put these in place, please bring our office a copy.  Nice to meet you today!!  Thank you,  Nolberto Hanlon, RN

## 2017-03-17 NOTE — Progress Notes (Signed)
Subjective:   Patrick Novak is a 76 y.o. male who presents for Medicare Annual/Subsequent preventive examination.  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  He lives at home with his wife Patrick Novak, they have been married 96 years.  They have 2 sons and 2 daughters, and 10 grandchildren.  Currently their grandson and great grand daughter are living with them.    He spends a lot of time being active playing golf twice per week and growing a large garden.  He states he eats small frequent meals throughout the day mostly consisting of fruits, vegetables, lean proteins.  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      Review of Systems:  All negative today        Objective:    Vitals: Ht 5' 9.75" (1.772 m)   Wt 173 lb (78.5 kg)   BMI 25.00 kg/m   Body mass index is 25 kg/m.  Tobacco History  Smoking Status  . Former Smoker  . Packs/day: 1.50  . Types: Cigarettes  . Quit date: 08/21/1998  Smokeless Tobacco  . Never Used    Comment: quit in 2000     Counseling given: Not Answered   Past Medical History:  Diagnosis Date  . Bradycardia    asympotmatic. Holter (2/11) with PACs, 1 run of 5 beats NSVT, HR range 41-125 with no pauses.   . Cancer (Early)    gastric lymphoma  . COPD (chronic obstructive pulmonary disease) ([place])   . ED (erectile dysfunction)   . Gastric ulcer   . GERD (gastroesophageal reflux disease)   . Hiatal hernia   . History of stomach cancer    surgery in 2001  . HTN (hypertension)   . Low testosterone   . MR (mitral regurgitation)    echo 2/11: EF 60%, no regional wall abnormalities, mild MR, mild to moderate RV dialation, mild to moderate RV dysfunction   . Osteopenia   . Vertigo   . Vertigo   . Vitamin D deficiency    Past Surgical History:  Procedure Laterality Date  . CHOLECYSTECTOMY  1995  . radical subtotal gastrectomy  09/1999   Family History  Problem  Relation Age of Onset  . Stroke Father   . Heart disease Mother   . Stroke Brother   . Hip fracture Sister   . Cancer Sister        breast  . Heart disease Sister   . Hepatitis Sister   . Valvular heart disease Sister   . Alzheimer's disease Sister   . Lung disease Sister   . Early death Brother    History  Sexual Activity  . Sexual activity: Yes    Outpatient Encounter Prescriptions as of 03/17/2017  Medication Sig  . ANDROGEL PUMP 20.25 MG/ACT (1.62%) GEL APPLY TWO PUMPS TOPICALLY ONCE DAILY IN THE MORNING  . aspirin 81 MG EC tablet Take 81 mg by mouth daily.    . Calcium Carbonate-Vitamin D 600-400 MG-UNIT per tablet Take 1 tablet by mouth daily.   . fluticasone furoate-vilanterol (BREO ELLIPTA) 200-25 MCG/INH AEPB Inhale 1 puff into the lungs daily.  [name] lisinopril (PRINIVIL,ZESTRIL) 5 MG tablet Take 1 tablet (5 mg total) by mouth daily.  . meclizine (ANTIVERT) 12.5 MG tablet Take 1 tablet (12.5 mg total) by mouth 2 (two) times daily as needed.  . meloxicam (MOBIC) 15 MG tablet Take 1 tablet (15  mg total) by mouth daily. (Patient taking differently: Take 15 mg by mouth daily as needed. )  . Multiple Vitamin (MULTIVITAMIN) tablet Take 1 tablet by mouth daily.    . ondansetron (ZOFRAN) 4 MG tablet TAKE 1/2 TO 1 (ONE-HALF TO ONE) TABLET BY MOUTH TWICE DAILY AS NEEDED   No facility-administered encounter medications on file as of 03/17/2017.     Activities of Daily Living In your present state of health, do you have any difficulty performing the following activities: 03/17/2017  Hearing? Y  Comment wife states he has decreased hearing, patient has not noticed any problems  Vision? N  Difficulty concentrating or making decisions? N  Walking or climbing stairs? Y  Comment shortness of breath with climbing stairs  Dressing or bathing? N  Doing errands, shopping? N  Some recent data might be hidden    Patient Care Team: Claretta Fraise, MD as PCP - General (Family  Medicine) Larey Dresser, MD as Consulting Physician (Cardiology) Lavonna Monarch, MD as Consulting Physician (Dermatology) Sinda Du, MD as Consulting Physician (Pulmonary Disease)        Exercise Activities and Dietary recommendations Current Exercise Habits: Home exercise routine, Type of exercise: Other - see comments (Golfing and gardening), Time (Minutes): 60, Frequency (Times/Week): 4, Weekly Exercise (Minutes/Week): 240, Intensity: Moderate  Encouraged maintaining healthy diet consisting mostly of fruits, vegetables, lean proteins, and whole grains.   Goals    . Exercise 150 minutes per week (moderate activity)          Patient lives an active lifestyle- golfing at least twice per week and working in his garden.  Encouraged him to continue this level of activity    . Exercise 3x per week (30 min per time)          Continue to use stationary bike at least 3 times per week. Try to slowly increase the length of each exercise session.       Fall Risk Fall Risk  03/17/2017 12/08/2016 03/10/2016 09/08/2015 08/29/2015  Falls in the past year? No No No No No   Depression Screen PHQ 2/9 Scores 03/17/2017 12/08/2016 06/03/2016 05/28/2016  PHQ - 2 Score 0 0 0 0    Cognitive Function MMSE - Mini Mental State Exam 03/10/2016 09/20/2014  Orientation to time 4 5  Orientation to Place 5 5  Registration 3 3  Attention/ Calculation 5 5  Recall 3 3  Language- name 2 objects 2 2  Language- repeat 1 1  Language- follow 3 step command 3 3  Language- read & follow direction 1 1  Write a sentence 1 1  Copy design 1 1  Total score 29 30        Immunization History  Administered Date(s) Administered  . DTaP 07/23/2002  . Influenza Whole 03/22/2012  . Influenza, High Dose Seasonal PF 03/17/2017  . Influenza,inj,Quad PF,6+ Mos 03/27/2015, 03/10/2016  . Influenza-Unspecified 05/21/2014  . Pneumococcal Conjugate-13 05/07/2015  . Pneumococcal Polysaccharide-23 08/21/2006  . Zoster  08/20/2009   Screening Tests Health Maintenance  Topic Date Due  . INFLUENZA VACCINE  01/19/2017  . DEXA SCAN  12/14/2018  . TETANUS/TDAP  11/02/2023  . PNA vac Low Risk Adult  Completed   High dose flu vaccine given today.       Plan:     Maintain your healthy diet of mostly lean proteins, vegetables, fruits, and whole grains, and your active lifestyle.  Review the information given on Advanced Directives, if you decide to put these  in place, please bring our office a copy.    I have personally reviewed and noted the following in the patient's chart:   . Medical and social history . Use of alcohol, tobacco or illicit drugs  . Current medications and supplements . Functional ability and status . Nutritional status . Physical activity . Advanced directives . List of other physicians . Hospitalizations, surgeries, and ER visits in previous 12 months . Vitals . Screenings to include cognitive, depression, and falls . Referrals and appointments  In addition, I have reviewed and discussed with patient certain preventive protocols, quality metrics, and best practice recommendations. A written personalized care plan for preventive services as well as general preventive health recommendations were provided to patient.     Lauramae Kneisley M, RN  03/17/2017  I have reviewed and agree with the above AWV documentation.   Caryl Pina, MD [facility] 03/17/2017, 12:51 PM

## 2017-03-24 ENCOUNTER — Encounter (INDEPENDENT_AMBULATORY_CARE_PROVIDER_SITE_OTHER): Payer: Medicare Other | Admitting: Ophthalmology

## 2017-03-24 DIAGNOSIS — H43813 Vitreous degeneration, bilateral: Secondary | ICD-10-CM

## 2017-03-24 DIAGNOSIS — H353211 Exudative age-related macular degeneration, right eye, with active choroidal neovascularization: Secondary | ICD-10-CM

## 2017-03-24 DIAGNOSIS — H353122 Nonexudative age-related macular degeneration, left eye, intermediate dry stage: Secondary | ICD-10-CM

## 2017-04-20 ENCOUNTER — Encounter (INDEPENDENT_AMBULATORY_CARE_PROVIDER_SITE_OTHER): Payer: Medicare Other | Admitting: Ophthalmology

## 2017-04-20 DIAGNOSIS — H43813 Vitreous degeneration, bilateral: Secondary | ICD-10-CM | POA: Diagnosis not present

## 2017-04-20 DIAGNOSIS — H353122 Nonexudative age-related macular degeneration, left eye, intermediate dry stage: Secondary | ICD-10-CM | POA: Diagnosis not present

## 2017-04-20 DIAGNOSIS — H353211 Exudative age-related macular degeneration, right eye, with active choroidal neovascularization: Secondary | ICD-10-CM | POA: Diagnosis not present

## 2017-05-03 ENCOUNTER — Other Ambulatory Visit: Payer: Self-pay | Admitting: Family Medicine

## 2017-05-03 NOTE — Telephone Encounter (Signed)
Last seen 12/08/16  Dr Livia Snellen  If approved route to nurse to call into [facility]

## 2017-05-06 NOTE — Telephone Encounter (Signed)
Refill called to [facility] 

## 2017-05-10 ENCOUNTER — Encounter (INDEPENDENT_AMBULATORY_CARE_PROVIDER_SITE_OTHER): Payer: Medicare Other | Admitting: Ophthalmology

## 2017-05-10 DIAGNOSIS — H43813 Vitreous degeneration, bilateral: Secondary | ICD-10-CM

## 2017-05-10 DIAGNOSIS — H353122 Nonexudative age-related macular degeneration, left eye, intermediate dry stage: Secondary | ICD-10-CM

## 2017-05-10 DIAGNOSIS — H353211 Exudative age-related macular degeneration, right eye, with active choroidal neovascularization: Secondary | ICD-10-CM

## 2017-06-07 ENCOUNTER — Encounter (INDEPENDENT_AMBULATORY_CARE_PROVIDER_SITE_OTHER): Payer: Medicare Other | Admitting: Ophthalmology

## 2017-06-07 ENCOUNTER — Ambulatory Visit: Payer: Medicare Other | Admitting: Family Medicine

## 2017-06-07 DIAGNOSIS — H353211 Exudative age-related macular degeneration, right eye, with active choroidal neovascularization: Secondary | ICD-10-CM | POA: Diagnosis not present

## 2017-06-07 DIAGNOSIS — H353124 Nonexudative age-related macular degeneration, left eye, advanced atrophic with subfoveal involvement: Secondary | ICD-10-CM

## 2017-06-07 DIAGNOSIS — H43813 Vitreous degeneration, bilateral: Secondary | ICD-10-CM

## 2017-06-08 ENCOUNTER — Ambulatory Visit: Payer: Medicare Other | Admitting: Family Medicine

## 2017-06-17 DIAGNOSIS — M542 Cervicalgia: Secondary | ICD-10-CM | POA: Diagnosis not present

## 2017-06-17 DIAGNOSIS — M5412 Radiculopathy, cervical region: Secondary | ICD-10-CM | POA: Diagnosis not present

## 2017-06-17 DIAGNOSIS — M5010 Cervical disc disorder with radiculopathy, unspecified cervical region: Secondary | ICD-10-CM | POA: Diagnosis not present

## 2017-06-17 DIAGNOSIS — M5033 Other cervical disc degeneration, cervicothoracic region: Secondary | ICD-10-CM | POA: Diagnosis not present

## 2017-07-08 ENCOUNTER — Encounter (INDEPENDENT_AMBULATORY_CARE_PROVIDER_SITE_OTHER): Payer: Medicare Other | Admitting: Ophthalmology

## 2017-07-08 DIAGNOSIS — H43813 Vitreous degeneration, bilateral: Secondary | ICD-10-CM

## 2017-07-08 DIAGNOSIS — H353122 Nonexudative age-related macular degeneration, left eye, intermediate dry stage: Secondary | ICD-10-CM | POA: Diagnosis not present

## 2017-07-08 DIAGNOSIS — H353211 Exudative age-related macular degeneration, right eye, with active choroidal neovascularization: Secondary | ICD-10-CM

## 2017-07-21 ENCOUNTER — Other Ambulatory Visit: Payer: Self-pay | Admitting: Family Medicine

## 2017-07-21 NOTE — Telephone Encounter (Signed)
Last seen 12/08/16  Dr Livia Snellen

## 2017-07-21 NOTE — Telephone Encounter (Signed)
Forward to PCP/stacks 

## 2017-07-22 NOTE — Telephone Encounter (Signed)
Authorize 30 days only. Then contact the patient letting them know that they will need an appointment before any further prescriptions can be sent in. 

## 2017-08-05 ENCOUNTER — Encounter (INDEPENDENT_AMBULATORY_CARE_PROVIDER_SITE_OTHER): Payer: Medicare Other | Admitting: Ophthalmology

## 2017-08-05 DIAGNOSIS — H353122 Nonexudative age-related macular degeneration, left eye, intermediate dry stage: Secondary | ICD-10-CM | POA: Diagnosis not present

## 2017-08-05 DIAGNOSIS — H353211 Exudative age-related macular degeneration, right eye, with active choroidal neovascularization: Secondary | ICD-10-CM | POA: Diagnosis not present

## 2017-08-05 DIAGNOSIS — H43813 Vitreous degeneration, bilateral: Secondary | ICD-10-CM | POA: Diagnosis not present

## 2017-09-02 ENCOUNTER — Encounter (INDEPENDENT_AMBULATORY_CARE_PROVIDER_SITE_OTHER): Payer: Medicare Other | Admitting: Ophthalmology

## 2017-09-02 DIAGNOSIS — H353211 Exudative age-related macular degeneration, right eye, with active choroidal neovascularization: Secondary | ICD-10-CM | POA: Diagnosis not present

## 2017-09-02 DIAGNOSIS — H43813 Vitreous degeneration, bilateral: Secondary | ICD-10-CM | POA: Diagnosis not present

## 2017-09-02 DIAGNOSIS — H353122 Nonexudative age-related macular degeneration, left eye, intermediate dry stage: Secondary | ICD-10-CM | POA: Diagnosis not present

## 2017-09-05 ENCOUNTER — Other Ambulatory Visit: Payer: Self-pay | Admitting: Family Medicine

## 2017-09-23 ENCOUNTER — Ambulatory Visit (INDEPENDENT_AMBULATORY_CARE_PROVIDER_SITE_OTHER): Payer: Medicare Other | Admitting: Nurse Practitioner

## 2017-09-23 ENCOUNTER — Encounter: Payer: Self-pay | Admitting: Nurse Practitioner

## 2017-09-23 VITALS — BP 135/64 | HR 59 | Temp 96.5°F | Ht 69.0 in | Wt 174.0 lb

## 2017-09-23 DIAGNOSIS — J449 Chronic obstructive pulmonary disease, unspecified: Secondary | ICD-10-CM

## 2017-09-23 DIAGNOSIS — M85859 Other specified disorders of bone density and structure, unspecified thigh: Secondary | ICD-10-CM

## 2017-09-23 DIAGNOSIS — R5382 Chronic fatigue, unspecified: Secondary | ICD-10-CM | POA: Diagnosis not present

## 2017-09-23 DIAGNOSIS — I10 Essential (primary) hypertension: Secondary | ICD-10-CM

## 2017-09-23 DIAGNOSIS — L247 Irritant contact dermatitis due to plants, except food: Secondary | ICD-10-CM

## 2017-09-23 DIAGNOSIS — R7989 Other specified abnormal findings of blood chemistry: Secondary | ICD-10-CM | POA: Diagnosis not present

## 2017-09-23 DIAGNOSIS — K449 Diaphragmatic hernia without obstruction or gangrene: Secondary | ICD-10-CM | POA: Diagnosis not present

## 2017-09-23 DIAGNOSIS — R001 Bradycardia, unspecified: Secondary | ICD-10-CM | POA: Diagnosis not present

## 2017-09-23 MED ORDER — LISINOPRIL 5 MG PO TABS
5.0000 mg | ORAL_TABLET | Freq: Every day | ORAL | 1 refills | Status: DC
Start: 2017-09-23 — End: 2017-10-15

## 2017-09-23 MED ORDER — TESTOSTERONE 20.25 MG/ACT (1.62%) TD GEL: TRANSDERMAL | 1 refills | Status: DC

## 2017-09-23 MED ORDER — METHYLPREDNISOLONE ACETATE 80 MG/ML IJ SUSP
80.0000 mg | Freq: Once | INTRAMUSCULAR | Status: AC
  Administered 2017-09-23: 80 mg via INTRAMUSCULAR

## 2017-09-23 NOTE — Patient Instructions (Signed)
Bone Health Bones protect organs, store calcium, and anchor muscles. Good health habits, such as eating nutritious foods and exercising regularly, are important for maintaining healthy bones. They can also help to prevent a condition that causes bones to lose density and become weak and brittle (osteoporosis). Why is bone mass important? Bone mass refers to the amount of bone tissue that you have. The higher your bone mass, the stronger your bones. An important step toward having healthy bones throughout life is to have strong and dense bones during childhood. A young adult who has a high bone mass is more likely to have a high bone mass later in life. Bone mass at its greatest it is called peak bone mass. A large decline in bone mass occurs in older adults. In women, it occurs about the time of menopause. During this time, it is important to practice good health habits, because if more bone is lost than what is replaced, the bones will become less healthy and more likely to break (fracture). If you find that you have a low bone mass, you may be able to prevent osteoporosis or further bone loss by changing your diet and lifestyle. How can I find out if my bone mass is low? Bone mass can be measured with an X-ray test that is called a bone mineral density (BMD) test. This test is recommended for all women who are age 65 or older. It may also be recommended for men who are age 70 or older, or for people who are more likely to develop osteoporosis due to:  Having bones that break easily.  Having a long-term disease that weakens bones, such as kidney disease or rheumatoid arthritis.  Having menopause earlier than normal.  Taking medicine that weakens bones, such as steroids, thyroid hormones, or hormone treatment for breast cancer or prostate cancer.  Smoking.  Drinking three or more alcoholic drinks each day.  What are the nutritional recommendations for healthy bones? To have healthy bones, you  need to get enough of the right minerals and vitamins. Most nutrition experts recommend getting these nutrients from the foods that you eat. Nutritional recommendations vary from person to person. Ask your health care provider what is healthy for you. Here are some general guidelines. Calcium Recommendations Calcium is the most important (essential) mineral for bone health. Most people can get enough calcium from their diet, but supplements may be recommended for people who are at risk for osteoporosis. Good sources of calcium include:  Dairy products, such as low-fat or nonfat milk, cheese, and yogurt.  Dark green leafy vegetables, such as bok choy and broccoli.  Calcium-fortified foods, such as orange juice, cereal, bread, soy beverages, and tofu products.  Nuts, such as almonds.  Follow these recommended amounts for daily calcium intake:  Children, age 1?3: 700 mg.  Children, age 4?8: 1,000 mg.  Children, age 9?13: 1,300 mg.  Teens, age 14?18: 1,300 mg.  Adults, age 19?50: 1,000 mg.  Adults, age 51?70: ? Men: 1,000 mg. ? Women: 1,200 mg.  Adults, age 71 or older: 1,200 mg.  Pregnant and breastfeeding females: ? Teens: 1,300 mg. ? Adults: 1,000 mg.  Vitamin D Recommendations Vitamin D is the most essential vitamin for bone health. It helps the body to absorb calcium. Sunlight stimulates the skin to make vitamin D, so be sure to get enough sunlight. If you live in a cold climate or you do not get outside often, your health care provider may recommend that you take vitamin   D supplements. Good sources of vitamin D in your diet include:  Egg yolks.  Saltwater fish.  Milk and cereal fortified with vitamin D.  Follow these recommended amounts for daily vitamin D intake:  Children and teens, age 1?18: 600 international units.  Adults, age 50 or younger: 400-800 international units.  Adults, age 51 or older: 800-1,000 international units.  Other Nutrients Other nutrients  for bone health include:  Phosphorus. This mineral is found in meat, poultry, dairy foods, nuts, and legumes. The recommended daily intake for adult men and adult women is 700 mg.  Magnesium. This mineral is found in seeds, nuts, dark green vegetables, and legumes. The recommended daily intake for adult men is 400?420 mg. For adult women, it is 310?320 mg.  Vitamin K. This vitamin is found in green leafy vegetables. The recommended daily intake is 120 mg for adult men and 90 mg for adult women.  What type of physical activity is best for building and maintaining healthy bones? Weight-bearing and strength-building activities are important for building and maintaining peak bone mass. Weight-bearing activities cause muscles and bones to work against gravity. Strength-building activities increases muscle strength that supports bones. Weight-bearing and muscle-building activities include:  Walking and hiking.  Jogging and running.  Dancing.  Gym exercises.  Lifting weights.  Tennis and racquetball.  Climbing stairs.  Aerobics.  Adults should get at least 30 minutes of moderate physical activity on most days. Children should get at least 60 minutes of moderate physical activity on most days. Ask your health care provide what type of exercise is best for you. Where can I find more information? For more information, check out the following websites:  National Osteoporosis Foundation: http://nof.org/learn/basics  National Institutes of Health: http://www.niams.nih.gov/Health_Info/Bone/Bone_Health/bone_health_for_life.asp  This information is not intended to replace advice given to you by your health care provider. Make sure you discuss any questions you have with your health care provider. Document Released: 08/28/2003 Document Revised: 12/26/2015 Document Reviewed: 06/12/2014 Elsevier Interactive Patient Education  2018 [employer]  

## 2017-09-23 NOTE — Progress Notes (Signed)
Subjective:    Patient ID: Patrick Novak, male    DOB: 27-May-1941, 77 y.o.   MRN: [id number]  HPI   Patrick Novak is here today for follow up of chronic medical problem.  Outpatient Encounter Medications as of 09/23/2017  Medication Sig  . aspirin 81 MG EC tablet Take 81 mg by mouth daily.    . Calcium Carbonate-Vitamin D 600-400 MG-UNIT per tablet Take 1 tablet by mouth daily.   . fluticasone furoate-vilanterol (BREO ELLIPTA) 200-25 MCG/INH AEPB Inhale 1 puff into the lungs daily.  [name] lisinopril (PRINIVIL,ZESTRIL) 5 MG tablet TAKE 1 TABLET BY MOUTH  DAILY  . meclizine (ANTIVERT) 12.5 MG tablet Take 1 tablet (12.5 mg total) by mouth 2 (two) times daily as needed.  . meloxicam (MOBIC) 15 MG tablet Take 1 tablet (15 mg total) by mouth daily. (Patient taking differently: Take 15 mg by mouth daily as needed. )  . Multiple Vitamin (MULTIVITAMIN) tablet Take 1 tablet by mouth daily.    . ondansetron (ZOFRAN) 4 MG tablet TAKE 1/2 TO 1 (ONE-HALF TO ONE) TABLET BY MOUTH TWICE DAILY AS NEEDED  . Testosterone 20.25 MG/ACT (1.62%) GEL APPLY TWO PUMPS ONCE DAILY IN THE MORNING     1. Essential hypertension  No c/o chest pain, sob or headache. Does not check blood pressure at home. BP Readings from Last 3 Encounters:  12/08/16 (!) 125/58  06/03/16 138/68  05/28/16 (!) 157/78     2. Hiatal hernia  Has lots of heartburn, but doe snot take anything  3. Chronic obstructive pulmonary disease, unspecified COPD type ([place])  Is on BREO daily- she says she is doing well- no SOB  4. Osteopenia of neck of femur, unspecified laterality  last bone density test showed t score of -1.7. He does very little weight bearing exercises.  5. Bradycardia  Denies fatigue. No syncopal episodes  6.      Low testosterone          Has not had any testosterone in several months   New complaints: - feels tired all the time- has ran out of testosterone and so he has not had in awhile and wonders if that is why he is so  tired - has gotten into [employer] doing yas\rd work- on [employer] and forearms- ery itchy  Social history: Lives with wife- they take care of each other  Review of Systems  Constitutional: Negative for activity change and appetite change.  HENT: Negative.   Eyes: Negative for pain.  Respiratory: Negative for shortness of breath.   Cardiovascular: Negative for chest pain, palpitations and leg swelling.  Gastrointestinal: Negative for abdominal pain.  Endocrine: Negative for polydipsia.  Genitourinary: Negative.   Skin: Negative for rash.  Neurological: Negative for dizziness, weakness and headaches.  Hematological: Does not bruise/bleed easily.  Psychiatric/Behavioral: Negative.   All other systems reviewed and are negative.      Objective:   Physical Exam  Constitutional: He is oriented to person, place, and time. He appears well-developed and well-nourished.  HENT:  Head: Normocephalic.  Right Ear: External ear normal.  Left Ear: External ear normal.  Nose: Nose normal.  Mouth/Throat: Oropharynx is clear and moist.  Eyes: Pupils are equal, round, and reactive to light. EOM are normal.  Neck: Normal range of motion. Neck supple. No JVD present. No thyromegaly present.  Cardiovascular: Normal rate, regular rhythm, normal heart sounds and intact distal pulses. Exam reveals no gallop and no friction rub.  No murmur  heard. Pulmonary/Chest: Effort normal and breath sounds normal. No respiratory distress. He has no wheezes. He has no rales. He exhibits no tenderness.  Abdominal: Soft. Bowel sounds are normal. He exhibits no mass. There is no tenderness.  Genitourinary: Prostate normal and penis normal.  Musculoskeletal: Normal range of motion. He exhibits no edema.  Lymphadenopathy:    He has no cervical adenopathy.  Neurological: He is alert and oriented to person, place, and time. No cranial nerve deficit.  Skin: Skin is warm and dry.  Psychiatric: He has a normal mood and affect.  His behavior is normal. Judgment and thought content normal.   BP 135/64   Pulse (!) 59   Temp (!) 96.5 F (35.8 C) (Oral)   Ht 5' 9"  (1.753 m)   Wt 174 lb (78.9 kg)   BMI 25.70 kg/m       Assessment & Plan:  1. Essential hypertension Low sodium diet - CMP14+EGFR - Lipid panel - lisinopril (PRINIVIL,ZESTRIL) 5 MG tablet; Take 1 tablet (5 mg total) by mouth daily.  Dispense: 90 tablet; Refill: 1  2. Hiatal hernia Avoid over eating Chew food well  3. Chronic obstructive pulmonary disease, unspecified COPD type ([place]) Continue BREO Keep follow up appointment with dr. Luan Pulling  4. Osteopenia of neck of femur, unspecified laterality Weight bearing exercises  5. Bradycardia  6. Low testosterone Patient will come back in the morning to get labs drawn - Testosterone,Free and Total - Testosterone 20.25 MG/ACT (1.62%) GEL; APPLY TWO PUMPS ONCE DAILY IN THE MORNING  Dispense: 75 g; Refill: 1  7. Chronic fatigue - Anemia Profile B  8. Contact dermatitis -avoid scratching -cool compresses -depomedrol 26m IM daily  Labs pending Health maintenance reviewed Diet and exercise encouraged Continue all meds Follow up  In 3 months   [place] FNP

## 2017-09-24 ENCOUNTER — Other Ambulatory Visit: Payer: Medicare Other

## 2017-09-24 DIAGNOSIS — R5382 Chronic fatigue, unspecified: Secondary | ICD-10-CM

## 2017-09-24 DIAGNOSIS — R7989 Other specified abnormal findings of blood chemistry: Secondary | ICD-10-CM

## 2017-09-24 DIAGNOSIS — I10 Essential (primary) hypertension: Secondary | ICD-10-CM

## 2017-09-27 LAB — CMP14+EGFR
A/G RATIO: 2.2 (ref 1.2–2.2)
ALT: 17 IU/L (ref 0–44)
AST: 23 IU/L (ref 0–40)
Albumin: 4.2 g/dL (ref 3.5–4.8)
Alkaline Phosphatase: 124 IU/L — ABNORMAL HIGH (ref 39–117)
BILIRUBIN TOTAL: 1.1 mg/dL (ref 0.0–1.2)
BUN/Creatinine Ratio: 17 (ref 10–24)
BUN: 18 mg/dL (ref 8–27)
CHLORIDE: 100 mmol/L (ref 96–106)
CO2: 24 mmol/L (ref 20–29)
Calcium: 9.8 mg/dL (ref 8.6–10.2)
Creatinine, Ser: 1.09 mg/dL (ref 0.76–1.27)
GFR calc non Af Amer: 65 mL/min/{1.73_m2} (ref >59)
GFR, EST AFRICAN AMERICAN: 75 mL/min/{1.73_m2} (ref >59)
GLUCOSE: 89 mg/dL (ref 65–99)
Globulin, Total: 1.9 g/dL (ref 1.5–4.5)
POTASSIUM: 4.8 mmol/L (ref 3.5–5.2)
Sodium: 141 mmol/L (ref 134–144)
TOTAL PROTEIN: 6.1 g/dL (ref 6.0–8.5)

## 2017-09-27 LAB — ANEMIA PROFILE B
BASOS: 0 %
Basophils Absolute: 0 10*3/uL (ref 0.0–0.2)
EOS (ABSOLUTE): 0.2 10*3/uL (ref 0.0–0.4)
EOS: 2 %
FERRITIN: 52 ng/mL (ref 30–400)
Folate: >20 ng/mL (ref >3.0)
HEMATOCRIT: 49 % (ref 37.5–51.0)
HEMOGLOBIN: 16.1 g/dL (ref 13.0–17.7)
IMMATURE GRANS (ABS): 0 10*3/uL (ref 0.0–0.1)
IMMATURE GRANULOCYTES: 0 %
IRON SATURATION: 55 % (ref 15–55)
Iron: 155 ug/dL (ref 38–169)
LYMPHS: 27 %
Lymphocytes Absolute: 2.2 10*3/uL (ref 0.7–3.1)
MCH: 33 pg (ref 26.6–33.0)
MCHC: 32.9 g/dL (ref 31.5–35.7)
MCV: 100 fL — ABNORMAL HIGH (ref 79–97)
MONOS ABS: 0.3 10*3/uL (ref 0.1–0.9)
Monocytes: 4 %
NEUTROS PCT: 67 %
Neutrophils Absolute: 5.3 10*3/uL (ref 1.4–7.0)
Platelets: 302 10*3/uL (ref 150–379)
RBC: 4.88 x10E6/uL (ref 4.14–5.80)
RDW: 13.6 % (ref 12.3–15.4)
Retic Ct Pct: 1.7 % (ref 0.6–2.6)
TIBC: 280 ug/dL (ref 250–450)
UIBC: 125 ug/dL (ref 111–343)
Vitamin B-12: 513 pg/mL (ref 232–1245)
WBC: 8 10*3/uL (ref 3.4–10.8)

## 2017-09-27 LAB — LIPID PANEL
CHOL/HDL RATIO: 3.1 ratio (ref 0.0–5.0)
Cholesterol, Total: 144 mg/dL (ref 100–199)
HDL: 47 mg/dL (ref >39)
LDL Calculated: 82 mg/dL (ref 0–99)
TRIGLYCERIDES: 73 mg/dL (ref 0–149)
VLDL CHOLESTEROL CAL: 15 mg/dL (ref 5–40)

## 2017-09-27 LAB — TESTOSTERONE,FREE AND TOTAL
TESTOSTERONE: 321 ng/dL (ref 264–916)
Testosterone, Free: 3.5 pg/mL — ABNORMAL LOW (ref 6.6–18.1)

## 2017-09-30 ENCOUNTER — Encounter (INDEPENDENT_AMBULATORY_CARE_PROVIDER_SITE_OTHER): Payer: Medicare Other | Admitting: Ophthalmology

## 2017-09-30 DIAGNOSIS — H353122 Nonexudative age-related macular degeneration, left eye, intermediate dry stage: Secondary | ICD-10-CM | POA: Diagnosis not present

## 2017-09-30 DIAGNOSIS — H353211 Exudative age-related macular degeneration, right eye, with active choroidal neovascularization: Secondary | ICD-10-CM

## 2017-09-30 DIAGNOSIS — H43813 Vitreous degeneration, bilateral: Secondary | ICD-10-CM | POA: Diagnosis not present

## 2017-10-15 ENCOUNTER — Other Ambulatory Visit: Payer: Self-pay | Admitting: Family Medicine

## 2017-10-15 DIAGNOSIS — I10 Essential (primary) hypertension: Secondary | ICD-10-CM

## 2017-10-20 DIAGNOSIS — J9611 Chronic respiratory failure with hypoxia: Secondary | ICD-10-CM | POA: Diagnosis not present

## 2017-10-20 DIAGNOSIS — I10 Essential (primary) hypertension: Secondary | ICD-10-CM | POA: Diagnosis not present

## 2017-10-20 DIAGNOSIS — J449 Chronic obstructive pulmonary disease, unspecified: Secondary | ICD-10-CM | POA: Diagnosis not present

## 2017-10-28 ENCOUNTER — Encounter (INDEPENDENT_AMBULATORY_CARE_PROVIDER_SITE_OTHER): Payer: Medicare Other | Admitting: Ophthalmology

## 2017-10-28 DIAGNOSIS — H353122 Nonexudative age-related macular degeneration, left eye, intermediate dry stage: Secondary | ICD-10-CM

## 2017-10-28 DIAGNOSIS — H43813 Vitreous degeneration, bilateral: Secondary | ICD-10-CM | POA: Diagnosis not present

## 2017-10-28 DIAGNOSIS — J449 Chronic obstructive pulmonary disease, unspecified: Secondary | ICD-10-CM | POA: Diagnosis not present

## 2017-10-28 DIAGNOSIS — H353211 Exudative age-related macular degeneration, right eye, with active choroidal neovascularization: Secondary | ICD-10-CM | POA: Diagnosis not present

## 2017-11-25 ENCOUNTER — Encounter (INDEPENDENT_AMBULATORY_CARE_PROVIDER_SITE_OTHER): Payer: Medicare Other | Admitting: Ophthalmology

## 2017-11-25 DIAGNOSIS — H353122 Nonexudative age-related macular degeneration, left eye, intermediate dry stage: Secondary | ICD-10-CM

## 2017-11-25 DIAGNOSIS — H43813 Vitreous degeneration, bilateral: Secondary | ICD-10-CM | POA: Diagnosis not present

## 2017-11-25 DIAGNOSIS — H353211 Exudative age-related macular degeneration, right eye, with active choroidal neovascularization: Secondary | ICD-10-CM | POA: Diagnosis not present

## 2017-11-28 DIAGNOSIS — J449 Chronic obstructive pulmonary disease, unspecified: Secondary | ICD-10-CM | POA: Diagnosis not present

## 2017-12-23 ENCOUNTER — Ambulatory Visit (INDEPENDENT_AMBULATORY_CARE_PROVIDER_SITE_OTHER): Payer: Medicare Other | Admitting: Nurse Practitioner

## 2017-12-23 ENCOUNTER — Encounter: Payer: Self-pay | Admitting: Nurse Practitioner

## 2017-12-23 VITALS — BP 129/61 | HR 58 | Temp 96.7°F | Ht 69.0 in | Wt 170.0 lb

## 2017-12-23 DIAGNOSIS — R059 Cough, unspecified: Secondary | ICD-10-CM

## 2017-12-23 DIAGNOSIS — R05 Cough: Secondary | ICD-10-CM | POA: Diagnosis not present

## 2017-12-23 MED ORDER — ONDANSETRON HCL 4 MG PO TABS: ORAL_TABLET | ORAL | 0 refills | Status: DC

## 2017-12-23 MED ORDER — AMOXICILLIN 875 MG PO TABS: 875.0000 mg | ORAL_TABLET | Freq: Two times a day (BID) | ORAL | 0 refills | Status: DC

## 2017-12-23 MED ORDER — METHYLPREDNISOLONE ACETATE 80 MG/ML IJ SUSP
80.0000 mg | Freq: Once | INTRAMUSCULAR | Status: AC
Start: 2017-12-23 — End: 2017-12-23
  Administered 2017-12-23: 80 mg via INTRAMUSCULAR

## 2017-12-23 NOTE — Patient Instructions (Signed)

## 2017-12-23 NOTE — Progress Notes (Signed)
   Subjective:    Patient ID: Patrick Novak, male    DOB: March 28, 1941, 77 y.o.   MRN: [id number]   Chief Complaint: Nasal Congestion and Cough   HPI - patient come sin today c/o cough and congestion. Started on Monday and feels worse today. He has not tried anything OTC other then motrin. - constipation for the lat week. Has been eating prunes and taking stool softner. Started miralax 2 days ago and finally went this morning and feels some better.   Review of Systems  Constitutional: Positive for appetite change (decreased) and chills. Negative for fever.  HENT: Positive for congestion, rhinorrhea and sinus pain. Negative for sore throat and trouble swallowing.   Respiratory: Positive for cough. Negative for shortness of breath.   Cardiovascular: Negative.   Gastrointestinal: Positive for constipation. Negative for diarrhea and nausea.  Genitourinary: Negative.   Musculoskeletal: Negative.   Neurological: Negative.   Psychiatric/Behavioral: Negative.   All other systems reviewed and are negative.      Objective:   Physical Exam  Constitutional: He is oriented to person, place, and time. He appears well-developed and well-nourished. Distressed: mild.  HENT:  Right Ear: Hearing, tympanic membrane, external ear and ear canal normal.  Left Ear: Hearing, tympanic membrane, external ear and ear canal normal.  Nose: Mucosal edema and rhinorrhea present. Right sinus exhibits no maxillary sinus tenderness and no frontal sinus tenderness. Left sinus exhibits no maxillary sinus tenderness and no frontal sinus tenderness.  Mouth/Throat: Uvula is midline, oropharynx is clear and moist and mucous membranes are normal.  Eyes: Pupils are equal, round, and reactive to light.  Neck: Normal range of motion. Neck supple.  Cardiovascular: Normal rate and regular rhythm.  Pulmonary/Chest: Effort normal. He has rales (left lower lobe).  Abdominal: Soft.  Musculoskeletal: Normal range of motion.    Neurological: He is alert and oriented to person, place, and time.  Skin: Skin is warm and dry.  Psychiatric: He has a normal mood and affect. His behavior is normal. Judgment and thought content normal.    BP 129/61   Pulse (!) 58   Temp (!) 96.7 F (35.9 C) (Oral)   Ht 5\' 9"  (1.753 m)   Wt 170 lb (77.1 kg)   BMI 25.10 kg/m        Assessment & Plan:  [name] in today with chief complaint of Nasal Congestion and Cough   1. Cough 1. Take meds as prescribed 2. Use a cool mist humidifier especially during the winter months and when heat has been humid. 3. Use saline nose sprays frequently 4. Saline irrigations of the nose can be very helpful if done frequently.  * 4X daily for 1 week*  * Use of a nettie pot can be helpful with this. Follow directions with this* 5. Drink plenty of fluids 6. Keep thermostat turn down low 7.For any cough or congestion  Use plain Mucinex- regular strength or max strength is fine   * Children- consult with Pharmacist for dosing 8. For fever or aces or pains- take tylenol or ibuprofen appropriate for age and weight.  * for fevers greater than 101 orally you may alternate ibuprofen and tylenol every  3 hours.    - DG Chest 2 View; Future - methylPREDNISolone acetate (DEPO-MEDROL) injection 80 mg - amoxicillin (AMOXIL) 875 MG tablet; Take 1 tablet (875 mg total) by mouth 2 (two) times daily. 1 po BID  Dispense: 20 tablet; Refill: 0  [name], FNP

## 2017-12-28 DIAGNOSIS — J449 Chronic obstructive pulmonary disease, unspecified: Secondary | ICD-10-CM | POA: Diagnosis not present

## 2017-12-30 ENCOUNTER — Encounter (INDEPENDENT_AMBULATORY_CARE_PROVIDER_SITE_OTHER): Payer: Medicare Other | Admitting: Ophthalmology

## 2017-12-30 DIAGNOSIS — H43813 Vitreous degeneration, bilateral: Secondary | ICD-10-CM

## 2017-12-30 DIAGNOSIS — H353122 Nonexudative age-related macular degeneration, left eye, intermediate dry stage: Secondary | ICD-10-CM | POA: Diagnosis not present

## 2017-12-30 DIAGNOSIS — H353211 Exudative age-related macular degeneration, right eye, with active choroidal neovascularization: Secondary | ICD-10-CM | POA: Diagnosis not present

## 2018-01-02 DIAGNOSIS — X32XXXD Exposure to sunlight, subsequent encounter: Secondary | ICD-10-CM | POA: Diagnosis not present

## 2018-01-02 DIAGNOSIS — C44329 Squamous cell carcinoma of skin of other parts of face: Secondary | ICD-10-CM | POA: Diagnosis not present

## 2018-01-02 DIAGNOSIS — L570 Actinic keratosis: Secondary | ICD-10-CM | POA: Diagnosis not present

## 2018-01-10 ENCOUNTER — Ambulatory Visit (INDEPENDENT_AMBULATORY_CARE_PROVIDER_SITE_OTHER): Payer: Medicare Other | Admitting: Nurse Practitioner

## 2018-01-10 ENCOUNTER — Encounter: Payer: Self-pay | Admitting: Nurse Practitioner

## 2018-01-10 VITALS — BP 132/76 | HR 51 | Temp 97.1°F | Ht 69.0 in | Wt 173.0 lb

## 2018-01-10 DIAGNOSIS — J449 Chronic obstructive pulmonary disease, unspecified: Secondary | ICD-10-CM | POA: Diagnosis not present

## 2018-01-10 DIAGNOSIS — M85859 Other specified disorders of bone density and structure, unspecified thigh: Secondary | ICD-10-CM | POA: Diagnosis not present

## 2018-01-10 DIAGNOSIS — R001 Bradycardia, unspecified: Secondary | ICD-10-CM

## 2018-01-10 DIAGNOSIS — I10 Essential (primary) hypertension: Secondary | ICD-10-CM | POA: Diagnosis not present

## 2018-01-10 MED ORDER — LISINOPRIL 5 MG PO TABS: 5.0000 mg | ORAL_TABLET | Freq: Every day | ORAL | 1 refills | Status: DC

## 2018-01-10 NOTE — Progress Notes (Signed)
Subjective:    Patient ID: Patrick Novak, male    DOB: October 06, 1940, 77 y.o.   MRN: [id number]   Chief Complaint: Medical Management of Chronic Issues   HPI:  1. Essential hypertension  No c/o chest pain, sob or headache. Does not check blood pressures at home. BP Readings from Last 3 Encounters:  12/23/17 129/61  09/23/17 135/64  12/08/16 (!) 125/58     2. Chronic obstructive pulmonary disease, unspecified COPD type ([place])  No cough or sob. Uses inhalers daily. Uses oxygen at night.  3. Osteopenia of neck of femur, unspecified laterality  Patient takes calcium supplement- does very little exercise.  4. Bradycardia  Heart rate 51 today    Outpatient Encounter Medications as of 01/10/2018  Medication Sig  . amoxicillin (AMOXIL) 875 MG tablet Take 1 tablet (875 mg total) by mouth 2 (two) times daily. 1 po BID  . aspirin 81 MG EC tablet Take 81 mg by mouth daily.    . Calcium Carbonate-Vitamin D 600-400 MG-UNIT per tablet Take 1 tablet by mouth daily.   . fluticasone furoate-vilanterol (BREO ELLIPTA) 200-25 MCG/INH AEPB Inhale 1 puff into the lungs daily.  [name] lisinopril (PRINIVIL,ZESTRIL) 5 MG tablet TAKE 1 TABLET BY MOUTH  DAILY  . meclizine (ANTIVERT) 12.5 MG tablet Take 1 tablet (12.5 mg total) by mouth 2 (two) times daily as needed.  . meloxicam (MOBIC) 15 MG tablet Take 1 tablet (15 mg total) by mouth daily. (Patient taking differently: Take 15 mg by mouth daily as needed. )  . Multiple Vitamin (MULTIVITAMIN) tablet Take 1 tablet by mouth daily.    . ondansetron (ZOFRAN) 4 MG tablet TAKE 1/2 TO 1 (ONE-HALF TO ONE) TABLET BY MOUTH TWICE DAILY AS NEEDED  . Testosterone 20.25 MG/ACT (1.62%) GEL APPLY TWO PUMPS ONCE DAILY IN THE MORNING      New complaints: None today  Social history: Lives with wife of 3 years- they both try to stay active   Review of Systems  Constitutional: Negative for activity change and appetite change.  HENT: Negative.   Eyes: Negative for pain.    Respiratory: Negative for shortness of breath.   Cardiovascular: Negative for chest pain, palpitations and leg swelling.  Gastrointestinal: Negative for abdominal pain.  Endocrine: Negative for polydipsia.  Genitourinary: Negative.   Skin: Negative for rash.  Neurological: Negative for dizziness, weakness and headaches.  Hematological: Does not bruise/bleed easily.  Psychiatric/Behavioral: Negative.   All other systems reviewed and are negative.      Objective:   Physical Exam  Constitutional: He is oriented to person, place, and time. He appears well-developed and well-nourished.  HENT:  Head: Normocephalic.  Nose: Nose normal.  Mouth/Throat: Oropharynx is clear and moist.  Eyes: Pupils are equal, round, and reactive to light. EOM are normal.  Neck: Normal range of motion and phonation normal. Neck supple. No JVD present. Carotid bruit is not present. No thyroid mass and no thyromegaly present.  Cardiovascular: Normal rate and regular rhythm.  Pulmonary/Chest: Effort normal and breath sounds normal. No respiratory distress.  Abdominal: Soft. Normal appearance, normal aorta and bowel sounds are normal. There is no tenderness.  Musculoskeletal: Normal range of motion.  Lymphadenopathy:    He has no cervical adenopathy.  Neurological: He is alert and oriented to person, place, and time.  Skin: Skin is warm and dry.  3cm annular healing surgical wound left cheek-  No signs of infection  Psychiatric: Judgment normal.  Nursing note and vitals reviewed.  BP 132/76   Pulse (!) 51   Temp (!) 97.1 F (36.2 C) (Oral)   Ht _0  (1.753 m)   Wt 173 lb (78.5 kg)   BMI 25.55 kg/m      Assessment & Plan:  Patrick Novak comes in today with chief complaint of Medical Management of Chronic Issues   Diagnosis and orders addressed:  1. Essential hypertension Low sodium diet - CMP14+EGFR - Lipid panel - lisinopril (PRINIVIL,ZESTRIL) 5 MG tablet; Take 1 tablet (5 mg total) by  mouth daily.  Dispense: 90 tablet; Refill: 1  2. Chronic obstructive pulmonary disease, unspecified COPD type ([place]) Avoid getting out in heat  3. Osteopenia of neck of femur, unspecified laterality Weight bearing exercises  4. Bradycardia   Labs pending Health Maintenance reviewed Diet and exercise encouraged  Follow up plan: 3 months   [name], FNP

## 2018-01-10 NOTE — Patient Instructions (Signed)

## 2018-01-11 LAB — CMP14+EGFR
A/G RATIO: 1.7 (ref 1.2–2.2)
ALT: 17 IU/L (ref 0–44)
AST: 18 IU/L (ref 0–40)
Albumin: 3.8 g/dL (ref 3.5–4.8)
Alkaline Phosphatase: 106 IU/L (ref 39–117)
BUN/Creatinine Ratio: 13 (ref 10–24)
BUN: 16 mg/dL (ref 8–27)
Bilirubin Total: 0.4 mg/dL (ref 0.0–1.2)
CALCIUM: 8.9 mg/dL (ref 8.6–10.2)
CO2: 27 mmol/L (ref 20–29)
CREATININE: 1.24 mg/dL (ref 0.76–1.27)
Chloride: 102 mmol/L (ref 96–106)
GFR, EST AFRICAN AMERICAN: 64 mL/min/{1.73_m2} (ref >59)
GFR, EST NON AFRICAN AMERICAN: 55 mL/min/{1.73_m2} — AB (ref >59)
GLOBULIN, TOTAL: 2.3 g/dL (ref 1.5–4.5)
Glucose: 151 mg/dL — ABNORMAL HIGH (ref 65–99)
Potassium: 4.2 mmol/L (ref 3.5–5.2)
SODIUM: 140 mmol/L (ref 134–144)
TOTAL PROTEIN: 6.1 g/dL (ref 6.0–8.5)

## 2018-01-11 LAB — LIPID PANEL
CHOL/HDL RATIO: 3.2 ratio (ref 0.0–5.0)
Cholesterol, Total: 121 mg/dL (ref 100–199)
HDL: 38 mg/dL — AB (ref >39)
LDL CALC: 60 mg/dL (ref 0–99)
TRIGLYCERIDES: 114 mg/dL (ref 0–149)
VLDL Cholesterol Cal: 23 mg/dL (ref 5–40)

## 2018-01-28 DIAGNOSIS — J449 Chronic obstructive pulmonary disease, unspecified: Secondary | ICD-10-CM | POA: Diagnosis not present

## 2018-02-01 DIAGNOSIS — I10 Essential (primary) hypertension: Secondary | ICD-10-CM | POA: Diagnosis not present

## 2018-02-01 DIAGNOSIS — J301 Allergic rhinitis due to pollen: Secondary | ICD-10-CM | POA: Diagnosis not present

## 2018-02-01 DIAGNOSIS — J9611 Chronic respiratory failure with hypoxia: Secondary | ICD-10-CM | POA: Diagnosis not present

## 2018-02-01 DIAGNOSIS — J449 Chronic obstructive pulmonary disease, unspecified: Secondary | ICD-10-CM | POA: Diagnosis not present

## 2018-02-03 ENCOUNTER — Other Ambulatory Visit: Payer: Self-pay | Admitting: Nurse Practitioner

## 2018-02-03 ENCOUNTER — Encounter (INDEPENDENT_AMBULATORY_CARE_PROVIDER_SITE_OTHER): Payer: Medicare Other | Admitting: Ophthalmology

## 2018-02-03 DIAGNOSIS — H353122 Nonexudative age-related macular degeneration, left eye, intermediate dry stage: Secondary | ICD-10-CM | POA: Diagnosis not present

## 2018-02-03 DIAGNOSIS — H353211 Exudative age-related macular degeneration, right eye, with active choroidal neovascularization: Secondary | ICD-10-CM

## 2018-02-03 DIAGNOSIS — H43813 Vitreous degeneration, bilateral: Secondary | ICD-10-CM

## 2018-02-03 DIAGNOSIS — R7989 Other specified abnormal findings of blood chemistry: Secondary | ICD-10-CM

## 2018-02-03 NOTE — Telephone Encounter (Signed)
Last seen 01/10/18

## 2018-02-09 DIAGNOSIS — L570 Actinic keratosis: Secondary | ICD-10-CM | POA: Diagnosis not present

## 2018-02-09 DIAGNOSIS — Z85828 Personal history of other malignant neoplasm of skin: Secondary | ICD-10-CM | POA: Diagnosis not present

## 2018-02-09 DIAGNOSIS — D045 Carcinoma in situ of skin of trunk: Secondary | ICD-10-CM | POA: Diagnosis not present

## 2018-02-09 DIAGNOSIS — X32XXXD Exposure to sunlight, subsequent encounter: Secondary | ICD-10-CM | POA: Diagnosis not present

## 2018-02-09 DIAGNOSIS — Z08 Encounter for follow-up examination after completed treatment for malignant neoplasm: Secondary | ICD-10-CM | POA: Diagnosis not present

## 2018-02-28 DIAGNOSIS — J449 Chronic obstructive pulmonary disease, unspecified: Secondary | ICD-10-CM | POA: Diagnosis not present

## 2018-03-03 ENCOUNTER — Encounter (INDEPENDENT_AMBULATORY_CARE_PROVIDER_SITE_OTHER): Payer: Medicare Other | Admitting: Ophthalmology

## 2018-03-03 DIAGNOSIS — H353122 Nonexudative age-related macular degeneration, left eye, intermediate dry stage: Secondary | ICD-10-CM

## 2018-03-03 DIAGNOSIS — H353211 Exudative age-related macular degeneration, right eye, with active choroidal neovascularization: Secondary | ICD-10-CM

## 2018-03-03 DIAGNOSIS — H43813 Vitreous degeneration, bilateral: Secondary | ICD-10-CM

## 2018-03-08 ENCOUNTER — Encounter: Payer: Self-pay | Admitting: Family Medicine

## 2018-03-08 ENCOUNTER — Ambulatory Visit (INDEPENDENT_AMBULATORY_CARE_PROVIDER_SITE_OTHER): Payer: Medicare Other | Admitting: Family Medicine

## 2018-03-08 ENCOUNTER — Ambulatory Visit (INDEPENDENT_AMBULATORY_CARE_PROVIDER_SITE_OTHER): Payer: Medicare Other

## 2018-03-08 VITALS — BP 143/77 | HR 75 | Temp 97.0°F | Ht 69.0 in | Wt 171.2 lb

## 2018-03-08 DIAGNOSIS — M67912 Unspecified disorder of synovium and tendon, left shoulder: Secondary | ICD-10-CM | POA: Diagnosis not present

## 2018-03-08 DIAGNOSIS — J438 Other emphysema: Secondary | ICD-10-CM

## 2018-03-08 DIAGNOSIS — R05 Cough: Secondary | ICD-10-CM | POA: Diagnosis not present

## 2018-03-08 MED ORDER — METHYLPREDNISOLONE ACETATE 80 MG/ML IJ SUSP
80.0000 mg | Freq: Once | INTRAMUSCULAR | Status: AC
  Administered 2018-03-08: 80 mg via INTRAMUSCULAR

## 2018-03-08 NOTE — Progress Notes (Signed)
BP (!) 143/77   Pulse 75   Temp (!) 97 F (36.1 C) (Oral)   Ht 5\' 9"  (1.753 m)   Wt 171 lb 3.2 oz (77.7 kg)   SpO2 94%   BMI 25.28 kg/m    Subjective:    Patient ID: Patrick Novak, male    DOB: 12-Jan-1941, 77 y.o.   MRN: [id number]  HPI: ORVAN PAPADAKIS is a 77 y.o. male presenting on 03/08/2018 for Shoulder Pain (Left x 1 week); Cough (x 2 days); and Nasal Congestion   HPI Left shoulder pain Left shoulder pain that has been bothering him more over the past week than it has ever before.  He says he has a lot of pain with overhead range of motion and reaching across his body.  He says it hurts him at night to even lay on it and says that the pain when it does hurt him can be very severe.  He says at rest he does not feel that much pain but mainly when he moves it when he leans on it.  He denies any fevers or chills or redness or warmth or swelling.  He denies any numbness or weakness or decreased range of motion but just has pain with range of motion.  COPD recheck Patient has a chronic history of smoking 1-1/2 packs for many years but quit in 2000 and has COPD from it and has been having more issues with congestion and coughing that was treated but he still has the cough residual that is about his baseline but slightly more than his baseline.  He is coming in for his repeat chest x-ray to make sure that everything cleared from the last infection.  Patient has COPD severe enough that he does use oxygen at home sometimes and at night but not all of the time.  He is not currently on oxygen  Relevant past medical, surgical, family and social history reviewed and updated as indicated. Interim medical history since our last visit reviewed. Allergies and medications reviewed and updated.  Review of Systems  Constitutional: Negative for chills and fever.  Eyes: Negative for visual disturbance.  Respiratory: Positive for cough and wheezing. Negative for shortness of breath.   Cardiovascular:  Negative for chest pain and leg swelling.  Musculoskeletal: Positive for arthralgias. Negative for back pain and gait problem.  Skin: Negative for rash.  All other systems reviewed and are negative.   Per HPI unless specifically indicated above   Allergies as of 03/08/2018      Reactions   Codeine Nausea And Vomiting, Other (See Comments)   disoriented   Ciprofloxacin Nausea Only   Doxycycline Nausea And Vomiting   Sulfa Antibiotics Nausea And Vomiting   Nausea    Zithromax [azithromycin]    abd pain      Medication List        Accurate as of 03/08/18  9:23 AM. Always use your most recent med list.          aspirin 81 MG EC tablet Take 81 mg by mouth daily.   Calcium Carbonate-Vitamin D 600-400 MG-UNIT tablet Take 1 tablet by mouth daily.   fluticasone furoate-vilanterol 200-25 MCG/INH Aepb Commonly known as:  BREO ELLIPTA Inhale 1 puff into the lungs daily.   lisinopril 5 MG tablet Commonly known as:  PRINIVIL,ZESTRIL Take 1 tablet (5 mg total) by mouth daily.   meclizine 12.5 MG tablet Commonly known as:  ANTIVERT Take 1 tablet (12.5 mg total) by  mouth 2 (two) times daily as needed.   meloxicam 15 MG tablet Commonly known as:  MOBIC Take 1 tablet (15 mg total) by mouth daily.   multivitamin tablet Take 1 tablet by mouth daily.   ondansetron 4 MG tablet Commonly known as:  ZOFRAN TAKE 1/2 TO 1 TABLET BY MOUTH TWICE DAILY AS NEEDED   Testosterone 20.25 MG/ACT (1.62%) Gel APPLY 2 PUMPS ONCE DAILY IN THE MORNING          Objective:    BP (!) 143/77   Pulse 75   Temp (!) 97 F (36.1 C) (Oral)   Ht 5\' 9"  (1.753 m)   Wt 171 lb 3.2 oz (77.7 kg)   SpO2 94%   BMI 25.28 kg/m   Wt Readings from Last 3 Encounters:  03/08/18 171 lb 3.2 oz (77.7 kg)  01/10/18 173 lb (78.5 kg)  12/23/17 170 lb (77.1 kg)    Physical Exam  Constitutional: He is oriented to person, place, and time. He appears well-developed and well-nourished. No distress.  Eyes:  Conjunctivae are normal. No scleral icterus.  Neck: Neck supple. No thyromegaly present.  Cardiovascular: Normal rate, regular rhythm, normal heart sounds and intact distal pulses.  No murmur heard. Pulmonary/Chest: Effort normal and breath sounds normal. No respiratory distress. He has no wheezes.  Musculoskeletal: Normal range of motion. He exhibits tenderness (Tenderness over upper left shoulder, worse with cross body range of motion and overhead range of motion.  Rotator cuff testing positive for internal rotation and also positive Hawkins impingement sign). He exhibits no edema.  Lymphadenopathy:    He has no cervical adenopathy.  Neurological: He is alert and oriented to person, place, and time. Coordination normal.  Skin: Skin is warm and dry. No rash noted. He is not diaphoretic.  Psychiatric: He has a normal mood and affect. His behavior is normal.  Nursing note and vitals reviewed.   Left subacromial injection: Consent form signed. Risk factors of bleeding and infection discussed with patient and patient is agreeable towards injection. Patient prepped with Betadine. Lateral approach towards injection used. Injected 80 mg of Depo-Medrol and 1 mL of 2% lidocaine. Patient tolerated procedure well and no side effects from noted. Minimal to no bleeding. Simple bandage applied after.     Assessment & Plan:   Problem List Items Addressed This Visit      Respiratory   COPD (chronic obstructive pulmonary disease) ([place])   Relevant Medications   methylPREDNISolone acetate (DEPO-MEDROL) injection 80 mg (Completed)   Other Relevant Orders   DG Chest 2 View    Other Visit Diagnoses    Tendinopathy of left rotator cuff    -  Primary   Relevant Medications   methylPREDNISolone acetate (DEPO-MEDROL) injection 80 mg (Completed)       Follow up plan: Return if symptoms worsen or fail to improve.  Counseling provided for all of the vaccine components No orders of the defined types were  placed in this encounter.   Caryl Pina, MD [facility] 03/08/2018, 9:23 AM

## 2018-03-11 ENCOUNTER — Other Ambulatory Visit: Payer: Self-pay | Admitting: Nurse Practitioner

## 2018-03-13 NOTE — Telephone Encounter (Signed)
Last seen 03/08/18

## 2018-03-18 ENCOUNTER — Ambulatory Visit (INDEPENDENT_AMBULATORY_CARE_PROVIDER_SITE_OTHER): Payer: Medicare Other | Admitting: Family Medicine

## 2018-03-18 VITALS — BP 149/76 | HR 59 | Temp 96.5°F | Ht 69.0 in | Wt 170.8 lb

## 2018-03-18 DIAGNOSIS — J441 Chronic obstructive pulmonary disease with (acute) exacerbation: Secondary | ICD-10-CM

## 2018-03-18 MED ORDER — METHYLPREDNISOLONE ACETATE 80 MG/ML IJ SUSP
80.0000 mg | Freq: Once | INTRAMUSCULAR | Status: AC
  Administered 2018-03-18: 80 mg via INTRAMUSCULAR

## 2018-03-18 NOTE — Progress Notes (Signed)
BP (!) 149/76   Pulse (!) 59   Temp (!) 96.5 F (35.8 C) (Oral)   Ht 5\' 9"  (1.753 m)   Wt 170 lb 12.8 oz (77.5 kg)   BMI 25.22 kg/m    Subjective:    Patient ID: Patrick Novak, male    DOB: [birth year], 77 y.o.   MRN: [id number]  HPI: UNKNOWN Patrick Novak is a 77 y.o. male presenting on 03/18/2018 for Cough and sneezing   HPI Cough and congestion and sneezing and wheezing Patient is coming in with cough and congestion and sneezing wheezing is been going on for the past 3 weeks but is worsened over the past week.  He was seen last week with mostly upper respiratory symptoms and was treated for allergies but the wheezing has developed.  He is still having a hacking cough and is not improved.  He denies any shortness of breath and has been using his inhalers and they have been helping.  Relevant past medical, surgical, family and social history reviewed and updated as indicated. Interim medical history since our last visit reviewed. Allergies and medications reviewed and updated.  Review of Systems  Constitutional: Negative for chills and fever.  HENT: Positive for congestion, postnasal drip, rhinorrhea, sinus pressure, sneezing and sore throat. Negative for ear discharge, ear pain and voice change.   Eyes: Negative for pain, discharge, redness and visual disturbance.  Respiratory: Positive for cough and wheezing. Negative for shortness of breath.   Cardiovascular: Negative for chest pain and leg swelling.  Musculoskeletal: Negative for gait problem.  Skin: Negative for rash.  All other systems reviewed and are negative.   Per HPI unless specifically indicated above   Allergies as of 03/18/2018      Reactions   Codeine Nausea And Vomiting, Other (See Comments)   disoriented   Ciprofloxacin Nausea Only   Doxycycline Nausea And Vomiting   Sulfa Antibiotics Nausea And Vomiting   Nausea    Zithromax [azithromycin]    abd pain      Medication List        Accurate as of 03/18/18  11:05 AM. Always use your most recent med list.          aspirin 81 MG EC tablet Take 81 mg by mouth daily.   Calcium Carbonate-Vitamin D 600-400 MG-UNIT tablet Take 1 tablet by mouth daily.   fluticasone furoate-vilanterol 200-25 MCG/INH Aepb Commonly known as:  BREO ELLIPTA Inhale 1 puff into the lungs daily.   lisinopril 5 MG tablet Commonly known as:  PRINIVIL,ZESTRIL Take 1 tablet (5 mg total) by mouth daily.   meclizine 12.5 MG tablet Commonly known as:  ANTIVERT Take 1 tablet (12.5 mg total) by mouth 2 (two) times daily as needed.   meloxicam 15 MG tablet Commonly known as:  MOBIC Take 1 tablet (15 mg total) by mouth daily.   multivitamin tablet Take 1 tablet by mouth daily.   ondansetron 4 MG tablet Commonly known as:  ZOFRAN TAKE 1/2 TO 1 (ONE-HALF TO ONE) TABLET BY MOUTH TWICE DAILY AS NEEDED   Testosterone 20.25 MG/ACT (1.62%) Gel APPLY 2 PUMPS ONCE DAILY IN THE MORNING          Objective:    BP (!) 149/76   Pulse (!) 59   Temp (!) 96.5 F (35.8 C) (Oral)   Ht 5\' 9"  (1.753 m)   Wt 170 lb 12.8 oz (77.5 kg)   BMI 25.22 kg/m   Wt Readings from Last 3  Encounters:  03/18/18 170 lb 12.8 oz (77.5 kg)  03/08/18 171 lb 3.2 oz (77.7 kg)  01/10/18 173 lb (78.5 kg)    Physical Exam  Constitutional: He is oriented to person, place, and time. He appears well-developed and well-nourished. No distress.  HENT:  Right Ear: Tympanic membrane, external ear and ear canal normal.  Left Ear: Tympanic membrane, external ear and ear canal normal.  Nose: Mucosal edema and rhinorrhea present. No sinus tenderness. No epistaxis. Right sinus exhibits no maxillary sinus tenderness and no frontal sinus tenderness. Left sinus exhibits no maxillary sinus tenderness and no frontal sinus tenderness.  Mouth/Throat: Uvula is midline and mucous membranes are normal. Posterior oropharyngeal edema present. No oropharyngeal exudate, posterior oropharyngeal erythema or tonsillar  abscesses.  Eyes: Conjunctivae are normal. No scleral icterus.  Neck: Neck supple. No thyromegaly present.  Cardiovascular: Normal rate, regular rhythm, normal heart sounds and intact distal pulses.  No murmur heard. Pulmonary/Chest: Effort normal and breath sounds normal. No respiratory distress. He has no wheezes. He has no rales.  Musculoskeletal: Normal range of motion. He exhibits no edema.  Lymphadenopathy:    He has no cervical adenopathy.  Neurological: He is alert and oriented to person, place, and time. Coordination normal.  Skin: Skin is warm and dry. No rash noted. He is not diaphoretic.  Psychiatric: He has a normal mood and affect. His behavior is normal.  Nursing note and vitals reviewed.       Assessment & Plan:   Problem List Items Addressed This Visit    None    Visit Diagnoses    COPD exacerbation ([place])    -  Primary   Mild COPD exacerbation, will do Depo-Medrol shot   Relevant Medications   methylPREDNISolone acetate (DEPO-MEDROL) injection 80 mg (Start on 03/18/2018 11:15 AM)      Mild COPD exacerbation, will do Depo-Medrol shot, if worsens or does not improve may consider steroids and antibiotic, patient did not want antibiotic today. Follow up plan: Return if symptoms worsen or fail to improve.  Counseling provided for all of the vaccine components No orders of the defined types were placed in this encounter.   Caryl Pina, MD [facility] 03/18/2018, 11:05 AM

## 2018-03-29 ENCOUNTER — Encounter (INDEPENDENT_AMBULATORY_CARE_PROVIDER_SITE_OTHER): Payer: Medicare Other | Admitting: Ophthalmology

## 2018-03-29 DIAGNOSIS — H353122 Nonexudative age-related macular degeneration, left eye, intermediate dry stage: Secondary | ICD-10-CM | POA: Diagnosis not present

## 2018-03-29 DIAGNOSIS — H43813 Vitreous degeneration, bilateral: Secondary | ICD-10-CM | POA: Diagnosis not present

## 2018-03-29 DIAGNOSIS — H353211 Exudative age-related macular degeneration, right eye, with active choroidal neovascularization: Secondary | ICD-10-CM

## 2018-03-30 ENCOUNTER — Ambulatory Visit (INDEPENDENT_AMBULATORY_CARE_PROVIDER_SITE_OTHER): Payer: Medicare Other | Admitting: *Deleted

## 2018-03-30 ENCOUNTER — Encounter: Payer: Self-pay | Admitting: *Deleted

## 2018-03-30 VITALS — BP 162/82 | HR 59 | Ht 69.0 in | Wt 170.0 lb

## 2018-03-30 DIAGNOSIS — Z0000 Encounter for general adult medical examination without abnormal findings: Secondary | ICD-10-CM | POA: Diagnosis not present

## 2018-03-30 DIAGNOSIS — J449 Chronic obstructive pulmonary disease, unspecified: Secondary | ICD-10-CM | POA: Diagnosis not present

## 2018-03-30 DIAGNOSIS — Z23 Encounter for immunization: Secondary | ICD-10-CM | POA: Diagnosis not present

## 2018-03-30 NOTE — Progress Notes (Signed)
Subjective:   Patrick Novak is a 77 y.o. male who presents for Medicare Annual/Subsequent preventive examination.  Patrick Novak is accompanied today by his wife Patrick Novak.  He is a retired Systems developer.  He stays active playing golf twice per week, gardening, and working around his property.  Patrick Novak has 4 children, 10 grandchildren, and 5 great grandchildren.  He and his wife live alone with their outside dog.  He feels his health is unchanged from last year.  He reports no surgeries, ER visits, or hospitalizations in the past year.   Review of Systems:  All negative today       Objective:    Vitals: BP (!) 162/82   Pulse (!) 59   Ht 5\' 9"  (1.753 m)   Wt 170 lb (77.1 kg)   BMI 25.10 kg/m   Body mass index is 25.1 kg/m.  Advanced Directives 03/30/2018 03/30/2018 03/17/2017 03/10/2016 09/20/2014  Does Patient Have a Medical Advance Directive? - No No No No  Would patient like information on creating a medical advance directive? Yes (MAU/Ambulatory/Procedural Areas - Information given) No - Patient declined Yes (ED - Information included in AVS) Yes - Educational materials given Yes - Educational materials given    Tobacco Social History   Tobacco Use  Smoking Status Former Smoker  . Packs/day: 1.50  . Types: Cigarettes  . Last attempt to quit: 08/21/1998  . Years since quitting: 19.6  Smokeless Tobacco Never Used  Tobacco Comment   quit in 2000     Counseling given: No Comment: quit in 2000   Clinical Intake:     Pain Score: 0-No pain                 Past Medical History:  Diagnosis Date  . Bradycardia    asympotmatic. Holter (2/11) with PACs, 1 run of 5 beats NSVT, HR range 41-125 with no pauses.   . 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 COPD (chronic obstructive pulmonary disease) ([place])   . ED (erectile dysfunction)   . Gastric ulcer   . GERD (gastroesophageal reflux disease)   . Hiatal hernia   . History of stomach cancer    surgery in 2001  . HTN  (hypertension)   . Low testosterone   . MR (mitral regurgitation)    echo 2/11: EF 60%, no regional wall abnormalities, mild MR, mild to moderate RV dialation, mild to moderate RV dysfunction   . Osteopenia   . Vertigo   . Vertigo   . Vitamin D deficiency    Past Surgical History:  Procedure Laterality Date  . CHOLECYSTECTOMY  1995  . radical subtotal gastrectomy  09/1999   Family History  Problem Relation Age of Onset  . Stroke Father   . Heart disease Mother   . Stroke Brother   . Hip fracture Sister   . Cancer Sister        breast  . Heart disease Sister   . Hepatitis Sister   . Valvular heart disease Sister   . Alzheimer's disease Sister   . Lung disease Sister   . Early death Brother    Social History   Socioeconomic History  . Marital status: Married    Spouse name: Not on file  . Number of children: Not on file  . Years of education: Not on file  . Highest education level: Not on file  Occupational History  . Not on file  Social Needs  . Financial  resource strain: Not on file  . Food insecurity:    Worry: Not on file    Inability: Not on file  . Transportation needs:    Medical: Not on file    Non-medical: Not on file  Tobacco Use  . Smoking status: Former Smoker    Packs/day: 1.50    Types: Cigarettes    Last attempt to quit: 08/21/1998    Years since quitting: 19.6  . Smokeless tobacco: Never Used  . Tobacco comment: quit in 2000  Substance and Sexual Activity  . Alcohol use: No  . Drug use: No  . Sexual activity: Yes  Lifestyle  . Physical activity:    Days per week: Not on file    Minutes per session: Not on file  . Stress: Not on file  Relationships  . Social connections:    Talks on phone: Not on file    Gets together: Not on file    Attends religious service: Not on file    Active member of club or organization: Not on file    Attends meetings of clubs or organizations: Not on file    Relationship status: Not on file  Other Topics  Concern  . Not on file  Social History Narrative   Married, lives in [place].   Retired Psychologist, sport and exercise, does some carpentry.     Outpatient Encounter Medications as of 03/30/2018  Medication Sig  . aspirin 81 MG EC tablet Take 81 mg by mouth daily.    . Calcium Carbonate-Vitamin D 600-400 MG-UNIT per tablet Take 1 tablet by mouth daily.   . fluticasone furoate-vilanterol (BREO ELLIPTA) 200-25 MCG/INH AEPB Inhale 1 puff into the lungs daily.  [name] lisinopril (PRINIVIL,ZESTRIL) 5 MG tablet Take 1 tablet (5 mg total) by mouth daily.  . meclizine (ANTIVERT) 12.5 MG tablet Take 1 tablet (12.5 mg total) by mouth 2 (two) times daily as needed.  . meloxicam (MOBIC) 15 MG tablet Take 1 tablet (15 mg total) by mouth daily. (Patient taking differently: Take 15 mg by mouth daily as needed. )  . Multiple Vitamin (MULTIVITAMIN) tablet Take 1 tablet by mouth daily.    . ondansetron (ZOFRAN) 4 MG tablet TAKE 1/2 TO 1 (ONE-HALF TO ONE) TABLET BY MOUTH TWICE DAILY AS NEEDED  . Testosterone 20.25 MG/ACT (1.62%) GEL APPLY 2 PUMPS ONCE DAILY IN THE MORNING   No facility-administered encounter medications on file as of 03/30/2018.     Activities of Daily Living In your present state of health, do you have any difficulty performing the following activities: 03/30/2018  Hearing? N  Vision? Y  Comment Problems with "floaters"when out in the sun.  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 N  Walking or climbing stairs? N  Dressing or bathing? N  Doing errands, shopping? N  Preparing Food and eating ? N  Using the Toilet? N  In the past six months, have you accidently leaked urine? N  Do you have problems with loss of bowel control? N  Managing your Medications? N  Comment Wife helps  Managing your Finances? N  Housekeeping or managing your Housekeeping? N  Some recent data might be hidden    Patient Care Team: Chevis Pretty, FNP as PCP - General  (Family Medicine) Larey Dresser, MD as Consulting Physician (Cardiology) Lavonna Monarch, MD as Consulting Physician (Dermatology) Sinda Du, MD as Consulting Physician (Pulmonary Disease)   Assessment:   This is a routine wellness examination for  Alveta Heimlich.  Exercise Activities and Dietary recommendations  Mr. Philipp states he eats 3 meals per day, and generally they are home cooked.  He raises a garden each summer, and has fresh vegetables to eat during the summer, and freezes and cans them to eat in the winter.  He usually has eggs and bacon for breakfast, a sandwich for lunch, vegetables and a meat for supper.  Recommended a diet of mostly lean proteins, vegetables, fruits and whole grains.   Current Exercise Habits: The patient does not participate in regular exercise at present  Goals    . Exercise 150 minutes per week (moderate activity)     Patient lives an active lifestyle- golfing at least twice per week and working in his garden.  Encouraged him to continue this level of activity    . Exercise 3x per week (30 min per time)     Continue to use stationary bike at least 3 times per week. Try to slowly increase the length of each exercise session.        Fall Risk Fall Risk  03/30/2018 03/18/2018 03/08/2018 09/23/2017 03/17/2017  Falls in the past year? No No No No No   Is the patient's home free of loose throw rugs in walkways, pet beds, electrical cords, etc?   yes      Grab bars in the bathroom? yes      Handrails on the stairs?   no- has stairs to basement outside of his house without railings.  Recommended installing railings if possible.      Adequate lighting?   yes   Depression Screen PHQ 2/9 Scores 03/30/2018 03/18/2018 03/08/2018 01/10/2018  PHQ - 2 Score 0 0 0 0    Cognitive Function MMSE - Mini Mental State Exam 03/30/2018 03/10/2016 09/20/2014  Orientation to time 5 4 5   Orientation to Place 5 5 5   Registration 3 3 3   Attention/ Calculation 5 5 5   Recall 3 3 3     Language- name 2 objects 2 2 2   Language- repeat 1 1 1   Language- follow 3 step command 3 3 3   Language- read & follow direction 1 1 1   Write a sentence 1 1 1   Copy design 1 1 1   Total score 30 29 30         Immunization History  Administered Date(s) Administered  . DTaP 07/23/2002  . Influenza Whole 03/22/2012  . Influenza, High Dose Seasonal PF 03/17/2017, 03/30/2018  . Influenza,inj,Quad PF,6+ Mos 03/27/2015, 03/10/2016  . Influenza-Unspecified 05/21/2014  . Pneumococcal Conjugate-13 05/07/2015  . Pneumococcal Polysaccharide-23 08/21/2006  . Tdap 10/22/2013  . Zoster 08/20/2009    Qualifies for Shingles Vaccine? Declined today  Screening Tests Health Maintenance  Topic Date Due  . INFLUENZA VACCINE  01/19/2018  . DEXA SCAN  12/14/2018  . TETANUS/TDAP  11/02/2023  . PNA vac Low Risk Adult  Completed   Cancer Screenings: Lung: Low Dose CT Chest recommended if Age 36-80 years, 30 pack-year currently smoking OR have quit w/in 15years. Patient does not qualify. Colorectal: not indicated  Additional Screenings:  Hepatitis C Screening:  Not indicated      Plan:     Work on your goal of increasing your exercise to 3 times per week for 30 minutes.  Walking and playing golf are good options.  Review the information given on Advance Directives, and if you complete these please bring a copy to our office to be filed in your medical record.  Remember to go for  your eye exam in November 2019.    I have personally reviewed and noted the following in the patient's chart:   . Medical and social history . Use of alcohol, tobacco or illicit drugs  . Current medications and supplements . Functional ability and status . Nutritional status . Physical activity . Advanced directives . List of other physicians . Hospitalizations, surgeries, and ER visits in previous 12 months . Vitals . Screenings to include cognitive, depression, and falls . Referrals and appointments  In  addition, I have reviewed and discussed with patient certain preventive protocols, quality metrics, and best practice recommendations. A written personalized care plan for preventive services as well as general preventive health recommendations were provided to patient.     Mckinzy Fuller M, RN  03/30/2018 I have reviewed and agree with the above AWV documentation.   [name], FNP

## 2018-03-30 NOTE — Patient Instructions (Signed)
Please work on your goal of increasing your exercise to 3 times per week for 30 minutes.  Walking and playing golf are good options.   Please review the information given on Advance Directives, and if you complete these please bring a copy to our office to be filed in your medical record.   Remember to go for your eye exam in November 2019.    Thank you for coming in for your Annual Wellness Visit today!!     Preventive Care 65 Years and Older, Male Preventive care refers to lifestyle choices and visits with your health care provider that can promote health and wellness. What does preventive care include?  A yearly physical exam. This is also called an annual well check.  Dental exams once or twice a year.  Routine eye exams. Ask your health care provider how often you should have your eyes checked.  Personal lifestyle choices, including: ? Daily care of your teeth and gums. ? Regular physical activity. ? Eating a healthy diet. ? Avoiding tobacco and drug use. ? Limiting alcohol use. ? Practicing safe sex. ? Taking low doses of aspirin every day. ? Taking vitamin and mineral supplements as recommended by your health care provider. What happens during an annual well check? The services and screenings done by your health care provider during your annual well check will depend on your age, overall health, lifestyle risk factors, and family history of disease. Counseling Your health care provider may ask you questions about your:  Alcohol use.  Tobacco use.  Drug use.  Emotional well-being.  Home and relationship well-being.  Sexual activity.  Eating habits.  History of falls.  Memory and ability to understand (cognition).  Work and work Statistician.  Screening You may have the following tests or measurements:  Height, weight, and BMI.  Blood pressure.  Lipid and cholesterol levels. These may be checked every 5 years, or more frequently if you are over 22  years old.  Skin check.  Lung cancer screening. You may have this screening every year starting at age 33 if you have a 30-pack-year history of smoking and currently smoke or have quit within the past 15 years.  Fecal occult blood test (FOBT) of the stool. You may have this test every year starting at age 4.  Flexible sigmoidoscopy or colonoscopy. You may have a sigmoidoscopy every 5 years or a colonoscopy every 10 years starting at age 56.  Prostate cancer screening. Recommendations will vary depending on your family history and other risks.  Hepatitis C blood test.  Hepatitis B blood test.  Sexually transmitted disease (STD) testing.  Diabetes screening. This is done by checking your blood sugar (glucose) after you have not eaten for a while (fasting). You may have this done every 1-3 years.  Abdominal aortic aneurysm (AAA) screening. You may need this if you are a current or former smoker.  Osteoporosis. You may be screened starting at age 29 if you are at high risk.  Talk with your health care provider about your test results, treatment options, and if necessary, the need for more tests. Vaccines Your health care provider may recommend certain vaccines, such as:  Influenza vaccine. This is recommended every year.  Tetanus, diphtheria, and acellular pertussis (Tdap, Td) vaccine. You may need a Td booster every 10 years.  Varicella vaccine. You may need this if you have not been vaccinated.  Zoster vaccine. You may need this after age 78.  Measles, mumps, and rubella (MMR) vaccine.  You may need at least one dose of MMR if you were born in 1957 or later. You may also need a second dose.  Pneumococcal 13-valent conjugate (PCV13) vaccine. One dose is recommended after age 45.  Pneumococcal polysaccharide (PPSV23) vaccine. One dose is recommended after age 54.  Meningococcal vaccine. You may need this if you have certain conditions.  Hepatitis A vaccine. You may need this  if you have certain conditions or if you travel or work in places where you may be exposed to hepatitis A.  Hepatitis B vaccine. You may need this if you have certain conditions or if you travel or work in places where you may be exposed to hepatitis B.  Haemophilus influenzae type b (Hib) vaccine. You may need this if you have certain risk factors.  Talk to your health care provider about which screenings and vaccines you need and how often you need them. This information is not intended to replace advice given to you by your health care provider. Make sure you discuss any questions you have with your health care provider. Document Released: 07/04/2015 Document Revised: 02/25/2016 Document Reviewed: 04/08/2015 Elsevier Interactive Patient Education  [employer].

## 2018-04-03 ENCOUNTER — Other Ambulatory Visit: Payer: Self-pay | Admitting: Family Medicine

## 2018-04-26 ENCOUNTER — Encounter (INDEPENDENT_AMBULATORY_CARE_PROVIDER_SITE_OTHER): Payer: Medicare Other | Admitting: Ophthalmology

## 2018-04-26 DIAGNOSIS — H43813 Vitreous degeneration, bilateral: Secondary | ICD-10-CM | POA: Diagnosis not present

## 2018-04-26 DIAGNOSIS — H353122 Nonexudative age-related macular degeneration, left eye, intermediate dry stage: Secondary | ICD-10-CM | POA: Diagnosis not present

## 2018-04-26 DIAGNOSIS — H353211 Exudative age-related macular degeneration, right eye, with active choroidal neovascularization: Secondary | ICD-10-CM | POA: Diagnosis not present

## 2018-04-30 DIAGNOSIS — J449 Chronic obstructive pulmonary disease, unspecified: Secondary | ICD-10-CM | POA: Diagnosis not present

## 2018-05-01 ENCOUNTER — Ambulatory Visit: Payer: Medicare Other | Admitting: Nurse Practitioner

## 2018-05-01 NOTE — Progress Notes (Deleted)
   Subjective:    Patient ID: Patrick Novak, male    DOB: 05/10/1941, 77 y.o.   MRN: [id number]   Chief Complaint: medical management of chronic issues  HPI:  1. Essential hypertension  No a/o chest pain, sob or headache. Does not check blood pressure at home. BP Readings from Last 3 Encounters:  03/30/18 (!) 162/82  03/18/18 (!) 149/76  03/08/18 (!) 143/77     2. Other emphysema ([place])  Had flare up last month and was given prednisone. Is doing better and denies cough.  3. Osteopenia of neck of femur, unspecified laterality  Had dexascan on 12/08/16 with t sore of -1.7. He denies any back pain.   4. Low testosterone  Testosterone level is on the low end of normal but free testosterone is low  5. Hiatal hernia  Does not have any problems if he does not overeat.    Outpatient Encounter Medications as of 05/01/2018  Medication Sig  . aspirin 81 MG EC tablet Take 81 mg by mouth daily.    [name] BREO ELLIPTA 200-25 MCG/INH AEPB INHALE 1 PUFF BY MOUTH ONCE DAILY  . Calcium Carbonate-Vitamin D 600-400 MG-UNIT per tablet Take 1 tablet by mouth daily.   [name] lisinopril (PRINIVIL,ZESTRIL) 5 MG tablet Take 1 tablet (5 mg total) by mouth daily.  . meclizine (ANTIVERT) 12.5 MG tablet Take 1 tablet (12.5 mg total) by mouth 2 (two) times daily as needed.  . meloxicam (MOBIC) 15 MG tablet Take 1 tablet (15 mg total) by mouth daily. (Patient taking differently: Take 15 mg by mouth daily as needed. )  . Multiple Vitamin (MULTIVITAMIN) tablet Take 1 tablet by mouth daily.    . ondansetron (ZOFRAN) 4 MG tablet TAKE 1/2 TO 1 (ONE-HALF TO ONE) TABLET BY MOUTH TWICE DAILY AS NEEDED  . Testosterone 20.25 MG/ACT (1.62%) GEL APPLY 2 PUMPS ONCE DAILY IN THE MORNING      New complaints: ***  Social history:    Review of Systems     Objective:   Physical Exam        Assessment & Plan:

## 2018-05-05 ENCOUNTER — Encounter: Payer: Self-pay | Admitting: Nurse Practitioner

## 2018-05-05 ENCOUNTER — Ambulatory Visit (INDEPENDENT_AMBULATORY_CARE_PROVIDER_SITE_OTHER): Payer: Medicare Other | Admitting: Nurse Practitioner

## 2018-05-05 VITALS — BP 125/64 | HR 59 | Temp 96.9°F | Ht 69.0 in | Wt 169.0 lb

## 2018-05-05 DIAGNOSIS — K449 Diaphragmatic hernia without obstruction or gangrene: Secondary | ICD-10-CM

## 2018-05-05 DIAGNOSIS — M85859 Other specified disorders of bone density and structure, unspecified thigh: Secondary | ICD-10-CM

## 2018-05-05 DIAGNOSIS — J438 Other emphysema: Secondary | ICD-10-CM

## 2018-05-05 DIAGNOSIS — R001 Bradycardia, unspecified: Secondary | ICD-10-CM

## 2018-05-05 DIAGNOSIS — I10 Essential (primary) hypertension: Secondary | ICD-10-CM | POA: Diagnosis not present

## 2018-05-05 MED ORDER — OMEPRAZOLE 20 MG PO CPDR: 20.0000 mg | DELAYED_RELEASE_CAPSULE | Freq: Every day | ORAL | 3 refills | Status: DC

## 2018-05-05 MED ORDER — LISINOPRIL 5 MG PO TABS: 5.0000 mg | ORAL_TABLET | Freq: Every day | ORAL | 1 refills | Status: DC

## 2018-05-05 MED ORDER — FLUTICASONE FUROATE-VILANTEROL 200-25 MCG/INH IN AEPB: 1.0000 | INHALATION_SPRAY | Freq: Every day | RESPIRATORY_TRACT | 3 refills | Status: DC

## 2018-05-05 MED ORDER — ONDANSETRON HCL 4 MG PO TABS: ORAL_TABLET | ORAL | 2 refills | Status: DC

## 2018-05-05 NOTE — Patient Instructions (Signed)
Bradycardia, Adult °Bradycardia is a slower-than-normal heartbeat. A normal resting heart rate for an adult ranges from 60 to 100 beats per minute. With bradycardia, the resting heart rate is less than 60 beats per minute. °Bradycardia can prevent enough oxygen from reaching certain areas of your body when you are active. It can be serious if it keeps enough oxygen from reaching your brain and other parts of your body. Bradycardia is not a problem for everyone. For some healthy adults, a slow resting heart rate is normal. °What are the causes? °This condition may be caused by: °· A problem with the heart, including: °? A problem with the heart's electrical system, such as a heart block. °? A problem with the heart's natural pacemaker (sinus node). °? Heart disease. °? A heart attack. °? Heart damage. °? A heart infection. °? A heart condition that is present at birth (congenital heart defect). °· Certain medicines that treat heart conditions. °· Certain conditions, such as hypothyroidism and obstructive sleep apnea. °· Problems with the balance of chemicals and other substances, like potassium, in the blood. ° °What increases the risk? °This condition is more likely to develop in adults who: °· Are age 65 or older. °· Have high blood pressure (hypertension), high cholesterol (hyperlipidemia), or diabetes. °· Drink heavily, use tobacco or nicotine products, or use drugs. °· Are stressed. ° °What are the signs or symptoms? °Symptoms of this condition include: °· Light-headedness. °· Feeling faint or fainting. °· Fatigue and weakness. °· Shortness of breath. °· Chest pain (angina). °· Drowsiness. °· Confusion. °· Dizziness. ° °How is this diagnosed? °This condition may be diagnosed based on: °· Your symptoms. °· Your medical history. °· A physical exam. ° °During the exam, your health care provider will listen to your heartbeat and check your pulse. To confirm the diagnosis, your health care provider may order tests,  such as: °· Blood tests. °· An electrocardiogram (ECG). This test records the heart's electrical activity. The test can show how fast your heart is beating and whether the heartbeat is steady. °· A test in which you wear a portable device (event recorder or Holter monitor) to record your heart's electrical activity while you go about your day. °· An exercise test. ° °How is this treated? °Treatment for this condition depends on the cause of the condition and how severe your symptoms are. Treatment may involve: °· Treatment of the underlying condition. °· Changing your medicines or how much medicine you take. °· Having a small, battery-operated device called a pacemaker implanted under the skin. When bradycardia occurs, this device can be used to increase your heart rate and help your heart to beat in a regular rhythm. ° °Follow these instructions at home: °Lifestyle ° °· Manage any health conditions that contribute to bradycardia as told by your health care provider. °· Follow a heart-healthy diet. A nutrition specialist (dietitian) can help to educate you about healthy food options and changes. °· Follow an exercise program that is approved by your health care provider. °· Maintain a healthy weight. °· Try to reduce or manage your stress, such as with yoga or meditation. If you need help reducing stress, ask your health care provider. °· Do not use use any products that contain nicotine or tobacco, such as cigarettes and e-cigarettes. If you need help quitting, ask your health care provider. °· Do not use illegal drugs. °· Limit alcohol intake to no more than 1 drink per day for nonpregnant women and 2 drinks per   day for men. One drink equals 12 oz of beer, 5 oz of wine, or 1½ oz of hard liquor. °General instructions °· Take over-the-counter and prescription medicines only as told by your health care provider. °· Keep all follow-up visits as directed by your health care provider. This is important. °How is this  prevented? °In some cases, bradycardia may be prevented by: °· Treating underlying medical problems. °· Stopping behaviors or medicines that can trigger the condition. ° °Contact a health care provider if: °· You feel light-headed or dizzy. °· You almost faint. °· You feel weak or are easily fatigued during physical activity. °· You experience confusion or have memory problems. °Get help right away if: °· You faint. °· You have an irregular heartbeat (palpitations). °· You have chest pain. °· You have trouble breathing. °This information is not intended to replace advice given to you by your health care provider. Make sure you discuss any questions you have with your health care provider. °Document Released: 02/27/2002 Document Revised: 02/03/2016 Document Reviewed: 11/27/2015 °Elsevier Interactive Patient Education © 2017 [employer] ° °

## 2018-05-05 NOTE — Progress Notes (Signed)
Subjective:    Patient ID: Patrick Novak, male    DOB: 07-20-1940, 77 y.o.   MRN: [id number]   Chief Complaint: medical management of chronic issues  HPI:  1. Essential hypertension  No c/o chest pain, sob or headache. Does not check blood pressure at home. BP Readings from Last 3 Encounters:  03/30/18 (!) 162/82  03/18/18 (!) 149/76  03/08/18 (!) 143/77     2. Other emphysema ([place])  Has slight cough but overall is doing well.  3. Osteopenia of neck of femur, unspecified laterality  Last dexascan was 12/08/16 with t score of -1.7. Takes daily vitamin d supplement.  4. Hiatal hernia  Doing well. Only has problems if he over eats.  5. Bradycardia  Doing well. No c/o dizziness or syncope    Outpatient Encounter Medications as of 05/05/2018  Medication Sig  . aspirin 81 MG EC tablet Take 81 mg by mouth daily.    [name] BREO ELLIPTA 200-25 MCG/INH AEPB INHALE 1 PUFF BY MOUTH ONCE DAILY  . Calcium Carbonate-Vitamin D 600-400 MG-UNIT per tablet Take 1 tablet by mouth daily.   [name] lisinopril (PRINIVIL,ZESTRIL) 5 MG tablet Take 1 tablet (5 mg total) by mouth daily.  . meclizine (ANTIVERT) 12.5 MG tablet Take 1 tablet (12.5 mg total) by mouth 2 (two) times daily as needed.  . meloxicam (MOBIC) 15 MG tablet Take 1 tablet (15 mg total) by mouth daily. (Patient taking differently: Take 15 mg by mouth daily as needed. )  . Multiple Vitamin (MULTIVITAMIN) tablet Take 1 tablet by mouth daily.    . ondansetron (ZOFRAN) 4 MG tablet TAKE 1/2 TO 1 (ONE-HALF TO ONE) TABLET BY MOUTH TWICE DAILY AS NEEDED  . Testosterone 20.25 MG/ACT (1.62%) GEL APPLY 2 PUMPS ONCE DAILY IN THE MORNING       New complaints: Has gastric pain after eating. Takes several hours to resolve.  Social history: Lives with wife of 34 years. They are both still very active.    Review of Systems  Constitutional: Negative for activity change and appetite change.  HENT: Negative.   Eyes: Negative for pain.  Respiratory:  Negative for shortness of breath.   Cardiovascular: Negative for chest pain, palpitations and leg swelling.  Gastrointestinal: Negative for abdominal pain.  Endocrine: Negative for polydipsia.  Genitourinary: Negative.   Skin: Negative for rash.  Neurological: Negative for dizziness, weakness and headaches.  Hematological: Does not bruise/bleed easily.  Psychiatric/Behavioral: Negative.   All other systems reviewed and are negative.      Objective:   Physical Exam  Constitutional: He is oriented to person, place, and time. He appears well-developed and well-nourished.  HENT:  Head: Normocephalic.  Nose: Nose normal.  Mouth/Throat: Oropharynx is clear and moist.  Eyes: Pupils are equal, round, and reactive to light. EOM are normal.  Neck: Normal range of motion and phonation normal. Neck supple. No JVD present. Carotid bruit is not present. No thyroid mass and no thyromegaly present.  Cardiovascular: Normal rate and regular rhythm.  Pulmonary/Chest: Effort normal and breath sounds normal. No respiratory distress.  Abdominal: Soft. Normal appearance, normal aorta and bowel sounds are normal. There is no tenderness.  Musculoskeletal: Normal range of motion.  Lymphadenopathy:    He has no cervical adenopathy.  Neurological: He is alert and oriented to person, place, and time.  Skin: Skin is warm and dry.  Psychiatric: He has a normal mood and affect. His behavior is normal. Judgment and thought content normal.  Nursing note  and vitals reviewed.  BP 125/64   Pulse (!) 59   Temp (!) 96.9 F (36.1 C) (Oral)   Ht 5' 9"  (1.753 m)   Wt 169 lb (76.7 kg)   BMI 24.96 kg/m       Assessment & Plan:  OMRAN KEELIN comes in today with chief complaint of Medical Management of Chronic Issues   Diagnosis and orders addressed:  1. Essential hypertension Low sodium diet - lisinopril (PRINIVIL,ZESTRIL) 5 MG tablet; Take 1 tablet (5 mg total) by mouth daily.  Dispense: 90 tablet; Refill:  1 - CMP14+EGFR - Lipid panel  2. Other emphysema (HCC) Continue BREO - fluticasone furoate-vilanterol (BREO ELLIPTA) 200-25 MCG/INH AEPB; Inhale 1 puff into the lungs daily.  Dispense: 60 each; Refill: 3  3. Osteopenia of neck of femur, unspecified laterality Weight bearing exercise  4. Hiatal hernia Do not over eat Added omeprazole to meds - omeprazole (PRILOSEC) 20 MG capsule; Take 1 capsule (20 mg total) by mouth daily.  Dispense: 30 capsule; Refill: 3  5. Bradycardia   Labs pending Health Maintenance reviewed Diet and exercise encouraged  Follow up plan: 6 months   [place], FNP

## 2018-05-05 NOTE — Addendum Note (Signed)
Addended by: Chevis Pretty on: 05/05/2018 09:27 AM   Modules accepted: Orders

## 2018-05-06 LAB — CMP14+EGFR
ALBUMIN: 4 g/dL (ref 3.5–4.8)
ALT: 20 IU/L (ref 0–44)
AST: 19 IU/L (ref 0–40)
Albumin/Globulin Ratio: 1.7 (ref 1.2–2.2)
Alkaline Phosphatase: 107 IU/L (ref 39–117)
BILIRUBIN TOTAL: 0.6 mg/dL (ref 0.0–1.2)
BUN/Creatinine Ratio: 13 (ref 10–24)
BUN: 15 mg/dL (ref 8–27)
CALCIUM: 9.7 mg/dL (ref 8.6–10.2)
CHLORIDE: 103 mmol/L (ref 96–106)
CO2: 26 mmol/L (ref 20–29)
Creatinine, Ser: 1.13 mg/dL (ref 0.76–1.27)
GFR calc non Af Amer: 62 mL/min/{1.73_m2} (ref >59)
GFR, EST AFRICAN AMERICAN: 72 mL/min/{1.73_m2} (ref >59)
Globulin, Total: 2.4 g/dL (ref 1.5–4.5)
Glucose: 85 mg/dL (ref 65–99)
Potassium: 4.7 mmol/L (ref 3.5–5.2)
Sodium: 144 mmol/L (ref 134–144)
TOTAL PROTEIN: 6.4 g/dL (ref 6.0–8.5)

## 2018-05-06 LAB — LIPID PANEL
Chol/HDL Ratio: 3.1 ratio (ref 0.0–5.0)
Cholesterol, Total: 151 mg/dL (ref 100–199)
HDL: 48 mg/dL (ref >39)
LDL Calculated: 83 mg/dL (ref 0–99)
Triglycerides: 101 mg/dL (ref 0–149)
VLDL Cholesterol Cal: 20 mg/dL (ref 5–40)

## 2018-05-10 ENCOUNTER — Encounter: Payer: Self-pay | Admitting: Nurse Practitioner

## 2018-05-12 ENCOUNTER — Ambulatory Visit (INDEPENDENT_AMBULATORY_CARE_PROVIDER_SITE_OTHER): Payer: Medicare Other | Admitting: Nurse Practitioner

## 2018-05-12 ENCOUNTER — Encounter: Payer: Self-pay | Admitting: Nurse Practitioner

## 2018-05-12 VITALS — BP 116/54 | HR 64 | Temp 97.0°F | Ht 69.0 in | Wt 172.0 lb

## 2018-05-12 DIAGNOSIS — J00 Acute nasopharyngitis [common cold]: Secondary | ICD-10-CM | POA: Diagnosis not present

## 2018-05-12 DIAGNOSIS — B9789 Other viral agents as the cause of diseases classified elsewhere: Secondary | ICD-10-CM

## 2018-05-12 DIAGNOSIS — J028 Acute pharyngitis due to other specified organisms: Secondary | ICD-10-CM

## 2018-05-12 DIAGNOSIS — J029 Acute pharyngitis, unspecified: Secondary | ICD-10-CM

## 2018-05-12 MED ORDER — LIDOCAINE VISCOUS HCL 2 % MT SOLN: 15.0000 mL | OROMUCOSAL | 0 refills | Status: DC | PRN

## 2018-05-12 NOTE — Patient Instructions (Signed)

## 2018-05-12 NOTE — Progress Notes (Signed)
   Subjective:    Patient ID: Patrick Novak, male    DOB: July 22, 1940, 77 y.o.   MRN: [id number]   Chief Complaint: Sore Throat   HPI Patient comes in today with his wife c/o sore throat for 1-2 days. Has been gargling with salt water. When he got up this morning his throat was so sore he could not swallow.    Review of Systems  Constitutional: Negative for chills and fever.  HENT: Positive for congestion, rhinorrhea, sore throat and trouble swallowing. Negative for ear pain.   Respiratory: Positive for cough (skight non productive).   Cardiovascular: Negative.   Gastrointestinal: Negative.   Genitourinary: Negative.   Neurological: Negative for dizziness and headaches.  Psychiatric/Behavioral: Negative.   All other systems reviewed and are negative.      Objective:   Physical Exam  Constitutional: He appears well-developed and well-nourished. No distress.  HENT:  Right Ear: Hearing, tympanic membrane, external ear and ear canal normal.  Left Ear: Hearing, tympanic membrane, external ear and ear canal normal.  Nose: Mucosal edema and rhinorrhea present. Right sinus exhibits no maxillary sinus tenderness and no frontal sinus tenderness. Left sinus exhibits no maxillary sinus tenderness and no frontal sinus tenderness.  Mouth/Throat: Oropharynx is clear and moist and mucous membranes are normal.  Eyes: Pupils are equal, round, and reactive to light.  Neck: Normal range of motion.  Cardiovascular: Normal rate.  Pulmonary/Chest: Effort normal and breath sounds normal. He has no wheezes. He has no rhonchi. He has no rales.  Abdominal: Soft. Bowel sounds are normal.  Neurological: He is alert.  Skin: Skin is warm and dry.  Psychiatric: He has a normal mood and affect. His behavior is normal.  Nursing note and vitals reviewed.   BP (!) 116/54   Pulse 64   Temp (!) 97 F (36.1 C) (Oral)   Ht 5\' 9"  (1.753 m)   Wt 172 lb (78 kg)   BMI 25.40 kg/m      Assessment & Plan:  Patrick Novak in today with chief complaint of Sore Throat   1. Acute viral pharyngitis - lidocaine (XYLOCAINE) 2 % solution; Use as directed 15 mLs in the mouth or throat as needed for mouth pain.  Dispense: 100 mL; Refill: 0  2. Acute nasopharyngitis 1. Take meds as prescribed 2. Use a cool mist humidifier especially during the winter months and when heat has been humid. 3. Use saline nose sprays frequently 4. Saline irrigations of the nose can be very helpful if done frequently.  * 4X daily for 1 week*  * Use of a nettie pot can be helpful with this. Follow directions with this* 5. Drink plenty of fluids 6. Keep thermostat turn down low 7.For any cough or congestion  Use plain Mucinex- regular strength or max strength is fine   * Children- consult with Pharmacist for dosing 8. For fever or aces or pains- take tylenol or ibuprofen appropriate for age and weight.  * for fevers greater than 101 orally you may alternate ibuprofen and tylenol every  3 hours.   Patrick Novak Done, FNP

## 2018-05-26 ENCOUNTER — Encounter (INDEPENDENT_AMBULATORY_CARE_PROVIDER_SITE_OTHER): Payer: Medicare Other | Admitting: Ophthalmology

## 2018-05-26 DIAGNOSIS — H43813 Vitreous degeneration, bilateral: Secondary | ICD-10-CM | POA: Diagnosis not present

## 2018-05-26 DIAGNOSIS — H353122 Nonexudative age-related macular degeneration, left eye, intermediate dry stage: Secondary | ICD-10-CM

## 2018-05-26 DIAGNOSIS — H353211 Exudative age-related macular degeneration, right eye, with active choroidal neovascularization: Secondary | ICD-10-CM | POA: Diagnosis not present

## 2018-05-30 DIAGNOSIS — J449 Chronic obstructive pulmonary disease, unspecified: Secondary | ICD-10-CM | POA: Diagnosis not present

## 2018-06-01 ENCOUNTER — Encounter: Payer: Self-pay | Admitting: Nurse Practitioner

## 2018-06-01 ENCOUNTER — Ambulatory Visit (INDEPENDENT_AMBULATORY_CARE_PROVIDER_SITE_OTHER): Payer: Medicare Other | Admitting: Nurse Practitioner

## 2018-06-01 VITALS — BP 146/69 | HR 58 | Temp 96.3°F | Ht 69.0 in | Wt 176.0 lb

## 2018-06-01 DIAGNOSIS — J069 Acute upper respiratory infection, unspecified: Secondary | ICD-10-CM | POA: Diagnosis not present

## 2018-06-01 MED ORDER — METHYLPREDNISOLONE ACETATE 80 MG/ML IJ SUSP
80.0000 mg | Freq: Once | INTRAMUSCULAR | Status: AC
  Administered 2018-06-01: 80 mg via INTRAMUSCULAR

## 2018-06-01 NOTE — Patient Instructions (Signed)

## 2018-06-01 NOTE — Progress Notes (Signed)
   Subjective:    Patient ID: Patrick Novak, male    DOB: [birth year], 77 y.o.   MRN: [id number]   Chief Complaint: Nasal Congestion and sneezing   HPI Patient comes in today c/o nasal congestion and sneezing. started yesterday. He has not tried taking anything other then advil.   Review of Systems  Constitutional: Negative for chills and fever.  HENT: Positive for congestion, rhinorrhea and sneezing. Negative for sore throat and trouble swallowing.   Respiratory: Negative for cough.   Neurological: Negative for headaches.  Psychiatric/Behavioral: Negative.   All other systems reviewed and are negative.      Objective:   Physical Exam Vitals signs and nursing note reviewed.  Constitutional:      General: He is not in acute distress.    Appearance: Normal appearance.  HENT:     Right Ear: Tympanic membrane, ear canal and external ear normal.     Left Ear: Tympanic membrane, ear canal and external ear normal.     Nose: Nose normal.     Mouth/Throat:     Mouth: Mucous membranes are moist.  Eyes:     Pupils: Pupils are equal, round, and reactive to light.  Neck:     Musculoskeletal: Normal range of motion and neck supple.  Cardiovascular:     Rate and Rhythm: Normal rate and regular rhythm.     Pulses: Normal pulses.     Heart sounds: Normal heart sounds.  Pulmonary:     Effort: Pulmonary effort is normal.     Breath sounds: Normal breath sounds.  Abdominal:     General: Bowel sounds are normal.     Palpations: Abdomen is soft.  Skin:    General: Skin is warm and dry.  Neurological:     General: No focal deficit present.     Mental Status: He is alert and oriented to person, place, and time.    BP (!) 146/69   Pulse (!) 58   Temp (!) 96.3 F (35.7 C) (Oral)   Ht 5\' 9"  (1.753 m)   Wt 176 lb (79.8 kg)   BMI 25.99 kg/m         Assessment & Plan:  [name] in today with chief complaint of Nasal Congestion and sneezing   1. Viral upper respiratory  tract infection 1. Take meds as prescribed 2. Use a cool mist humidifier especially during the winter months and when heat has been humid. 3. Use saline nose sprays frequently 4. Saline irrigations of the nose can be very helpful if done frequently.  * 4X daily for 1 week*  * Use of a nettie pot can be helpful with this. Follow directions with this* 5. Drink plenty of fluids 6. Keep thermostat turn down low 7.For any cough or congestion  Use plain Mucinex- regular strength or max strength is fine   * Children- consult with Pharmacist for dosing 8. For fever or aces or pains- take tylenol or ibuprofen appropriate for age and weight.  * for fevers greater than 101 orally you may alternate ibuprofen and tylenol every  3 hours.    - methylPREDNISolone acetate (DEPO-MEDROL) injection 80 mg  [name], FNP

## 2018-06-16 ENCOUNTER — Other Ambulatory Visit: Payer: Self-pay | Admitting: Nurse Practitioner

## 2018-06-16 ENCOUNTER — Telehealth: Payer: Self-pay | Admitting: Nurse Practitioner

## 2018-06-16 DIAGNOSIS — R7989 Other specified abnormal findings of blood chemistry: Secondary | ICD-10-CM

## 2018-06-16 MED ORDER — MECLIZINE HCL 12.5 MG PO TABS: 12.5000 mg | ORAL_TABLET | Freq: Two times a day (BID) | ORAL | 0 refills | Status: DC | PRN

## 2018-06-16 NOTE — Telephone Encounter (Signed)
PT is completley out of the meclizine (ANTIVERT) 12.5 MG tablet can we send in some to [facility] in [place] since he is completely out then send in regular rx [facility]

## 2018-06-16 NOTE — Telephone Encounter (Signed)
Last seen 06/01/18  MMM

## 2018-06-23 ENCOUNTER — Encounter (INDEPENDENT_AMBULATORY_CARE_PROVIDER_SITE_OTHER): Payer: Medicare Other | Admitting: Ophthalmology

## 2018-06-23 DIAGNOSIS — H353211 Exudative age-related macular degeneration, right eye, with active choroidal neovascularization: Secondary | ICD-10-CM | POA: Diagnosis not present

## 2018-06-23 DIAGNOSIS — H353122 Nonexudative age-related macular degeneration, left eye, intermediate dry stage: Secondary | ICD-10-CM

## 2018-06-23 DIAGNOSIS — H43813 Vitreous degeneration, bilateral: Secondary | ICD-10-CM | POA: Diagnosis not present

## 2018-06-30 DIAGNOSIS — J449 Chronic obstructive pulmonary disease, unspecified: Secondary | ICD-10-CM | POA: Diagnosis not present

## 2018-07-13 ENCOUNTER — Ambulatory Visit: Payer: Medicare Other | Admitting: Nurse Practitioner

## 2018-07-21 ENCOUNTER — Encounter (INDEPENDENT_AMBULATORY_CARE_PROVIDER_SITE_OTHER): Payer: Medicare Other | Admitting: Ophthalmology

## 2018-07-21 DIAGNOSIS — H353211 Exudative age-related macular degeneration, right eye, with active choroidal neovascularization: Secondary | ICD-10-CM

## 2018-07-21 DIAGNOSIS — H353122 Nonexudative age-related macular degeneration, left eye, intermediate dry stage: Secondary | ICD-10-CM

## 2018-07-21 DIAGNOSIS — H43813 Vitreous degeneration, bilateral: Secondary | ICD-10-CM

## 2018-07-31 DIAGNOSIS — H353211 Exudative age-related macular degeneration, right eye, with active choroidal neovascularization: Secondary | ICD-10-CM | POA: Diagnosis not present

## 2018-07-31 DIAGNOSIS — J449 Chronic obstructive pulmonary disease, unspecified: Secondary | ICD-10-CM | POA: Diagnosis not present

## 2018-08-03 DIAGNOSIS — J9611 Chronic respiratory failure with hypoxia: Secondary | ICD-10-CM | POA: Diagnosis not present

## 2018-08-03 DIAGNOSIS — J301 Allergic rhinitis due to pollen: Secondary | ICD-10-CM | POA: Diagnosis not present

## 2018-08-03 DIAGNOSIS — I10 Essential (primary) hypertension: Secondary | ICD-10-CM | POA: Diagnosis not present

## 2018-08-03 DIAGNOSIS — J449 Chronic obstructive pulmonary disease, unspecified: Secondary | ICD-10-CM | POA: Diagnosis not present

## 2018-08-04 ENCOUNTER — Other Ambulatory Visit: Payer: Self-pay | Admitting: Nurse Practitioner

## 2018-08-04 DIAGNOSIS — R7989 Other specified abnormal findings of blood chemistry: Secondary | ICD-10-CM

## 2018-08-08 ENCOUNTER — Other Ambulatory Visit: Payer: Self-pay | Admitting: Nurse Practitioner

## 2018-08-08 DIAGNOSIS — R7989 Other specified abnormal findings of blood chemistry: Secondary | ICD-10-CM

## 2018-08-18 ENCOUNTER — Encounter (INDEPENDENT_AMBULATORY_CARE_PROVIDER_SITE_OTHER): Payer: Medicare Other | Admitting: Ophthalmology

## 2018-08-18 DIAGNOSIS — H43813 Vitreous degeneration, bilateral: Secondary | ICD-10-CM | POA: Diagnosis not present

## 2018-08-18 DIAGNOSIS — H353122 Nonexudative age-related macular degeneration, left eye, intermediate dry stage: Secondary | ICD-10-CM

## 2018-08-18 DIAGNOSIS — H353211 Exudative age-related macular degeneration, right eye, with active choroidal neovascularization: Secondary | ICD-10-CM | POA: Diagnosis not present

## 2018-08-23 DIAGNOSIS — M5030 Other cervical disc degeneration, unspecified cervical region: Secondary | ICD-10-CM | POA: Insufficient documentation

## 2018-08-29 DIAGNOSIS — J449 Chronic obstructive pulmonary disease, unspecified: Secondary | ICD-10-CM | POA: Diagnosis not present

## 2018-09-15 ENCOUNTER — Other Ambulatory Visit: Payer: Self-pay

## 2018-09-15 ENCOUNTER — Encounter (INDEPENDENT_AMBULATORY_CARE_PROVIDER_SITE_OTHER): Payer: Medicare Other | Admitting: Ophthalmology

## 2018-09-15 DIAGNOSIS — H43813 Vitreous degeneration, bilateral: Secondary | ICD-10-CM | POA: Diagnosis not present

## 2018-09-15 DIAGNOSIS — H353211 Exudative age-related macular degeneration, right eye, with active choroidal neovascularization: Secondary | ICD-10-CM

## 2018-09-15 DIAGNOSIS — H353122 Nonexudative age-related macular degeneration, left eye, intermediate dry stage: Secondary | ICD-10-CM | POA: Diagnosis not present

## 2018-09-29 DIAGNOSIS — J449 Chronic obstructive pulmonary disease, unspecified: Secondary | ICD-10-CM | POA: Diagnosis not present

## 2018-10-09 ENCOUNTER — Other Ambulatory Visit: Payer: Self-pay | Admitting: Nurse Practitioner

## 2018-10-09 DIAGNOSIS — R7989 Other specified abnormal findings of blood chemistry: Secondary | ICD-10-CM

## 2018-10-13 ENCOUNTER — Other Ambulatory Visit: Payer: Self-pay

## 2018-10-13 ENCOUNTER — Encounter (INDEPENDENT_AMBULATORY_CARE_PROVIDER_SITE_OTHER): Payer: Medicare Other | Admitting: Ophthalmology

## 2018-10-13 DIAGNOSIS — H353110 Nonexudative age-related macular degeneration, right eye, stage unspecified: Secondary | ICD-10-CM | POA: Diagnosis not present

## 2018-10-13 DIAGNOSIS — H43813 Vitreous degeneration, bilateral: Secondary | ICD-10-CM | POA: Diagnosis not present

## 2018-10-13 DIAGNOSIS — H353122 Nonexudative age-related macular degeneration, left eye, intermediate dry stage: Secondary | ICD-10-CM | POA: Diagnosis not present

## 2018-10-13 DIAGNOSIS — H353211 Exudative age-related macular degeneration, right eye, with active choroidal neovascularization: Secondary | ICD-10-CM | POA: Diagnosis not present

## 2018-10-29 DIAGNOSIS — J449 Chronic obstructive pulmonary disease, unspecified: Secondary | ICD-10-CM | POA: Diagnosis not present

## 2018-11-03 ENCOUNTER — Ambulatory Visit (INDEPENDENT_AMBULATORY_CARE_PROVIDER_SITE_OTHER): Payer: Medicare Other | Admitting: Nurse Practitioner

## 2018-11-03 ENCOUNTER — Other Ambulatory Visit: Payer: Self-pay

## 2018-11-03 ENCOUNTER — Encounter: Payer: Self-pay | Admitting: Nurse Practitioner

## 2018-11-03 DIAGNOSIS — R7989 Other specified abnormal findings of blood chemistry: Secondary | ICD-10-CM

## 2018-11-03 DIAGNOSIS — K219 Gastro-esophageal reflux disease without esophagitis: Secondary | ICD-10-CM

## 2018-11-03 DIAGNOSIS — R001 Bradycardia, unspecified: Secondary | ICD-10-CM

## 2018-11-03 DIAGNOSIS — I10 Essential (primary) hypertension: Secondary | ICD-10-CM | POA: Diagnosis not present

## 2018-11-03 DIAGNOSIS — J438 Other emphysema: Secondary | ICD-10-CM | POA: Diagnosis not present

## 2018-11-03 MED ORDER — OMEPRAZOLE 20 MG PO CPDR: 20.0000 mg | DELAYED_RELEASE_CAPSULE | Freq: Every day | ORAL | 3 refills | Status: DC

## 2018-11-03 MED ORDER — FLUTICASONE FUROATE-VILANTEROL 200-25 MCG/INH IN AEPB: 1.0000 | INHALATION_SPRAY | Freq: Every day | RESPIRATORY_TRACT | 3 refills | Status: DC

## 2018-11-03 MED ORDER — TESTOSTERONE 20.25 MG/ACT (1.62%) TD GEL: TRANSDERMAL | 0 refills | Status: DC

## 2018-11-03 MED ORDER — LISINOPRIL 5 MG PO TABS: 5.0000 mg | ORAL_TABLET | Freq: Every day | ORAL | 1 refills | Status: DC

## 2018-11-03 NOTE — Progress Notes (Signed)
Virtual Visit via telephone Note  I connected with Patrick Novak on 11/03/18 at 10:15 by telephone and verified that I am speaking with the correct person using two identifiers. Patrick Novak is currently located at home and no one is currently with her during visit. The provider, [name], FNP is located in their office at time of visit.  I discussed the limitations, risks, security and privacy concerns of performing an evaluation and management service by telephone and the availability of in person appointments. I also discussed with the patient that there may be a patient responsible charge related to this service. The patient expressed understanding and agreed to proceed.   History and Present Illness:   Chief Complaint: medical management of chronic issues   HPI:  1. Essential hypertension No c/o chest pain, sob or headache. Does not check blood pressure. BP Readings from Last 3 Encounters:  06/01/18 (!) 146/69  05/12/18 (!) 116/54  05/05/18 125/64     2. Bradycardia Last heart rate wsa 59. He denies any syncopial episodes  3. Other emphysema ([place]) Is on BREO daily- he denies any SOB. He says he is doing well.  4. Low testosterone Uses testosterone gel daily and is doing well.  5. GERD Takes  Omeprazole daily and is working well.   Outpatient Encounter Medications as of 11/03/2018  Medication Sig  . aspirin 81 MG EC tablet Take 81 mg by mouth daily.    . Calcium Carbonate-Vitamin D 600-400 MG-UNIT per tablet Take 1 tablet by mouth daily.   . fluticasone furoate-vilanterol (BREO ELLIPTA) 200-25 MCG/INH AEPB Inhale 1 puff into the lungs daily.  [name] lidocaine (XYLOCAINE) 2 % solution Use as directed 15 mLs in the mouth or throat as needed for mouth pain.  [name] lisinopril (PRINIVIL,ZESTRIL) 5 MG tablet Take 1 tablet (5 mg total) by mouth daily.  . meclizine (ANTIVERT) 12.5 MG tablet Take 1 tablet (12.5 mg total) by mouth 2 (two) times daily as needed.  .  meloxicam (MOBIC) 15 MG tablet Take 1 tablet (15 mg total) by mouth daily. (Patient taking differently: Take 15 mg by mouth daily as needed. )  . Multiple Vitamin (MULTIVITAMIN) tablet Take 1 tablet by mouth daily.    [name] omeprazole (PRILOSEC) 20 MG capsule Take 1 capsule (20 mg total) by mouth daily.  . ondansetron (ZOFRAN) 4 MG tablet TAKE 1/2 TO 1 (ONE-HALF TO ONE) TABLET BY MOUTH TWICE DAILY AS NEEDED  . Testosterone 20.25 MG/ACT (1.62%) GEL APPLY 2 PUMPS ONCE DAILY TOPICALLY IN THE MORNING       New complaints: None today  Social history: Lives with wife- they are currently planting a garden.     Review of Systems  Constitutional: Negative for diaphoresis and weight loss.  Eyes: Negative for blurred vision, double vision and pain.  Respiratory: Negative for shortness of breath.   Cardiovascular: Negative for chest pain, palpitations, orthopnea and leg swelling.  Gastrointestinal: Negative for abdominal pain.  Skin: Negative for rash.  Neurological: Negative for dizziness, sensory change, loss of consciousness, weakness and headaches.  Endo/Heme/Allergies: Negative for polydipsia. Does not bruise/bleed easily.  Psychiatric/Behavioral: Negative for memory loss. The patient does not have insomnia.   All other systems reviewed and are negative.    Observations/Objective: Alert and oriented- answers all questions appropriately No distress  Assessment and Plan: Patrick Novak comes in today with chief complaint of Medical Management of Chronic Issues   Diagnosis and orders addressed:  1. Essential hypertension Low  sodium diet - lisinopril (ZESTRIL) 5 MG tablet; Take 1 tablet (5 mg total) by mouth daily.  Dispense: 90 tablet; Refill: 1  2. Bradycardia  3. Other emphysema (HCC) Avoid allergens Wear mask when outside - fluticasone furoate-vilanterol (BREO ELLIPTA) 200-25 MCG/INH AEPB; Inhale 1 puff into the lungs daily.  Dispense: 60 each; Refill: 3  4. Low testosterone  Continue testosterone daily - Testosterone 20.25 MG/ACT (1.62%) GEL; APPLY 2 PUMPS ONCE DAILY TOPICALLY IN THE MORNING  Dispense: 75 g; Refill: 0  5. Gastroesophageal reflux disease without esophagitis Avoid spicy foods Do not eat 2 hours prior to bedtime - omeprazole (PRILOSEC) 20 MG capsule; Take 1 capsule (20 mg total) by mouth daily.  Dispense: 30 capsule; Refill: 3   Previous labs results reviewed Health Maintenance reviewed Diet and exercise encouraged  Follow up plan: 3 months     I discussed the assessment and treatment plan with the patient. The patient was provided an opportunity to ask questions and all were answered. The patient agreed with the plan and demonstrated an understanding of the instructions.   The patient was advised to call back or seek an in-person evaluation if the symptoms worsen or if the condition fails to improve as anticipated.  The above assessment and management plan was discussed with the patient. The patient verbalized understanding of and has agreed to the management plan. Patient is aware to call the clinic if symptoms persist or worsen. Patient is aware when to return to the clinic for a follow-up visit. Patient educated on when it is appropriate to go to the emergency department.   Time call ended:  10:25  I provided 15 minutes of non-face-to-face time during this encounter.    [name], FNP

## 2018-11-08 DIAGNOSIS — Z029 Encounter for administrative examinations, unspecified: Secondary | ICD-10-CM

## 2018-11-09 ENCOUNTER — Other Ambulatory Visit: Payer: Self-pay

## 2018-11-09 ENCOUNTER — Encounter (INDEPENDENT_AMBULATORY_CARE_PROVIDER_SITE_OTHER): Payer: Medicare Other | Admitting: Ophthalmology

## 2018-11-09 DIAGNOSIS — H353122 Nonexudative age-related macular degeneration, left eye, intermediate dry stage: Secondary | ICD-10-CM | POA: Diagnosis not present

## 2018-11-09 DIAGNOSIS — H43813 Vitreous degeneration, bilateral: Secondary | ICD-10-CM | POA: Diagnosis not present

## 2018-11-09 DIAGNOSIS — H353211 Exudative age-related macular degeneration, right eye, with active choroidal neovascularization: Secondary | ICD-10-CM | POA: Diagnosis not present

## 2018-11-13 DIAGNOSIS — R0602 Shortness of breath: Secondary | ICD-10-CM | POA: Diagnosis not present

## 2018-11-13 DIAGNOSIS — Z85028 Personal history of other malignant neoplasm of stomach: Secondary | ICD-10-CM | POA: Diagnosis not present

## 2018-11-13 DIAGNOSIS — Z9049 Acquired absence of other specified parts of digestive tract: Secondary | ICD-10-CM | POA: Diagnosis not present

## 2018-11-13 DIAGNOSIS — J189 Pneumonia, unspecified organism: Secondary | ICD-10-CM | POA: Diagnosis not present

## 2018-11-13 DIAGNOSIS — J449 Chronic obstructive pulmonary disease, unspecified: Secondary | ICD-10-CM | POA: Diagnosis not present

## 2018-11-13 DIAGNOSIS — J439 Emphysema, unspecified: Secondary | ICD-10-CM | POA: Diagnosis not present

## 2018-11-13 DIAGNOSIS — Z7982 Long term (current) use of aspirin: Secondary | ICD-10-CM | POA: Diagnosis not present

## 2018-11-13 DIAGNOSIS — J441 Chronic obstructive pulmonary disease with (acute) exacerbation: Secondary | ICD-10-CM | POA: Diagnosis not present

## 2018-11-13 DIAGNOSIS — I2510 Atherosclerotic heart disease of native coronary artery without angina pectoris: Secondary | ICD-10-CM | POA: Diagnosis not present

## 2018-11-13 DIAGNOSIS — J90 Pleural effusion, not elsewhere classified: Secondary | ICD-10-CM | POA: Diagnosis not present

## 2018-11-13 DIAGNOSIS — Z79899 Other long term (current) drug therapy: Secondary | ICD-10-CM | POA: Diagnosis not present

## 2018-11-13 DIAGNOSIS — Z8572 Personal history of non-Hodgkin lymphomas: Secondary | ICD-10-CM | POA: Diagnosis not present

## 2018-11-13 DIAGNOSIS — R911 Solitary pulmonary nodule: Secondary | ICD-10-CM | POA: Diagnosis not present

## 2018-11-13 DIAGNOSIS — I10 Essential (primary) hypertension: Secondary | ICD-10-CM | POA: Diagnosis not present

## 2018-11-23 DIAGNOSIS — Z880 Allergy status to penicillin: Secondary | ICD-10-CM | POA: Diagnosis not present

## 2018-11-23 DIAGNOSIS — Z8572 Personal history of non-Hodgkin lymphomas: Secondary | ICD-10-CM | POA: Diagnosis not present

## 2018-11-23 DIAGNOSIS — R531 Weakness: Secondary | ICD-10-CM | POA: Diagnosis not present

## 2018-11-23 DIAGNOSIS — J439 Emphysema, unspecified: Secondary | ICD-10-CM | POA: Diagnosis not present

## 2018-11-23 DIAGNOSIS — Z79899 Other long term (current) drug therapy: Secondary | ICD-10-CM | POA: Diagnosis not present

## 2018-11-23 DIAGNOSIS — J449 Chronic obstructive pulmonary disease, unspecified: Secondary | ICD-10-CM | POA: Diagnosis not present

## 2018-11-23 DIAGNOSIS — Z85028 Personal history of other malignant neoplasm of stomach: Secondary | ICD-10-CM | POA: Diagnosis not present

## 2018-11-23 DIAGNOSIS — I10 Essential (primary) hypertension: Secondary | ICD-10-CM | POA: Diagnosis not present

## 2018-11-23 DIAGNOSIS — Z885 Allergy status to narcotic agent status: Secondary | ICD-10-CM | POA: Diagnosis not present

## 2018-11-23 DIAGNOSIS — R001 Bradycardia, unspecified: Secondary | ICD-10-CM | POA: Diagnosis not present

## 2018-11-24 ENCOUNTER — Encounter: Payer: Self-pay | Admitting: Nurse Practitioner

## 2018-11-24 ENCOUNTER — Other Ambulatory Visit: Payer: Self-pay

## 2018-11-24 ENCOUNTER — Encounter: Payer: Medicare Other | Admitting: Nurse Practitioner

## 2018-11-24 NOTE — Progress Notes (Deleted)
   Virtual Visit via telephone Note  I connected with@ on 11/24/18 at *** by video and verified that I am speaking with the correct person using two identifiers. [name] is currently located at *** and *** is currently with her during visit. The provider, [name], FNP is located in their office at time of visit.  I discussed the limitations, risks, security and privacy concerns of performing an evaluation and management service by telephone and the availability of in person appointments. I also discussed with the patient that there may be a patient responsible charge related to this service. The patient expressed understanding and agreed to proceed.   History and Present Illness:   Chief Complaint: Hospitalization Follow-up   HPI Patient calls in today for hospital follow up. He went to ER on    ROS     Observations/Objective: ***  Assessment and Plan: ***  Follow Up Instructions: ***    I discussed the assessment and treatment plan with the patient. The patient was provided an opportunity to ask questions and all were answered. The patient agreed with the plan and demonstrated an understanding of the instructions.   The patient was advised to call back or seek an in-person evaluation if the symptoms worsen or if the condition fails to improve as anticipated.  The above assessment and management plan was discussed with the patient. The patient verbalized understanding of and has agreed to the management plan. Patient is aware to call the clinic if symptoms persist or worsen. Patient is aware when to return to the clinic for a follow-up visit. Patient educated on when it is appropriate to go to the emergency department.   Time call ended:  I provided *** minutes of face-to-face time during this encounter.    [name], FNP

## 2018-11-24 NOTE — Progress Notes (Signed)
Could not reach patient by phone- left 2 messages

## 2018-11-29 DIAGNOSIS — J449 Chronic obstructive pulmonary disease, unspecified: Secondary | ICD-10-CM | POA: Diagnosis not present

## 2018-12-08 ENCOUNTER — Encounter (INDEPENDENT_AMBULATORY_CARE_PROVIDER_SITE_OTHER): Payer: Medicare Other | Admitting: Ophthalmology

## 2018-12-08 ENCOUNTER — Other Ambulatory Visit: Payer: Self-pay

## 2018-12-08 DIAGNOSIS — H43813 Vitreous degeneration, bilateral: Secondary | ICD-10-CM

## 2018-12-08 DIAGNOSIS — H353122 Nonexudative age-related macular degeneration, left eye, intermediate dry stage: Secondary | ICD-10-CM

## 2018-12-08 DIAGNOSIS — H353211 Exudative age-related macular degeneration, right eye, with active choroidal neovascularization: Secondary | ICD-10-CM

## 2018-12-29 DIAGNOSIS — J449 Chronic obstructive pulmonary disease, unspecified: Secondary | ICD-10-CM | POA: Diagnosis not present

## 2019-01-12 ENCOUNTER — Encounter (INDEPENDENT_AMBULATORY_CARE_PROVIDER_SITE_OTHER): Payer: Medicare Other | Admitting: Ophthalmology

## 2019-01-12 ENCOUNTER — Other Ambulatory Visit: Payer: Self-pay

## 2019-01-12 DIAGNOSIS — H353211 Exudative age-related macular degeneration, right eye, with active choroidal neovascularization: Secondary | ICD-10-CM | POA: Diagnosis not present

## 2019-01-12 DIAGNOSIS — H43813 Vitreous degeneration, bilateral: Secondary | ICD-10-CM | POA: Diagnosis not present

## 2019-01-12 DIAGNOSIS — H353122 Nonexudative age-related macular degeneration, left eye, intermediate dry stage: Secondary | ICD-10-CM | POA: Diagnosis not present

## 2019-01-25 ENCOUNTER — Other Ambulatory Visit: Payer: Self-pay | Admitting: Nurse Practitioner

## 2019-01-25 DIAGNOSIS — I10 Essential (primary) hypertension: Secondary | ICD-10-CM

## 2019-01-29 DIAGNOSIS — J449 Chronic obstructive pulmonary disease, unspecified: Secondary | ICD-10-CM | POA: Diagnosis not present

## 2019-01-31 DIAGNOSIS — J449 Chronic obstructive pulmonary disease, unspecified: Secondary | ICD-10-CM | POA: Diagnosis not present

## 2019-01-31 DIAGNOSIS — J301 Allergic rhinitis due to pollen: Secondary | ICD-10-CM | POA: Diagnosis not present

## 2019-02-05 ENCOUNTER — Ambulatory Visit (INDEPENDENT_AMBULATORY_CARE_PROVIDER_SITE_OTHER): Payer: Medicare Other | Admitting: Nurse Practitioner

## 2019-02-05 ENCOUNTER — Encounter: Payer: Self-pay | Admitting: Nurse Practitioner

## 2019-02-05 DIAGNOSIS — R7989 Other specified abnormal findings of blood chemistry: Secondary | ICD-10-CM

## 2019-02-05 DIAGNOSIS — I10 Essential (primary) hypertension: Secondary | ICD-10-CM | POA: Diagnosis not present

## 2019-02-05 DIAGNOSIS — M85859 Other specified disorders of bone density and structure, unspecified thigh: Secondary | ICD-10-CM | POA: Diagnosis not present

## 2019-02-05 DIAGNOSIS — R001 Bradycardia, unspecified: Secondary | ICD-10-CM | POA: Diagnosis not present

## 2019-02-05 DIAGNOSIS — K219 Gastro-esophageal reflux disease without esophagitis: Secondary | ICD-10-CM

## 2019-02-05 DIAGNOSIS — J438 Other emphysema: Secondary | ICD-10-CM

## 2019-02-05 DIAGNOSIS — R42 Dizziness and giddiness: Secondary | ICD-10-CM

## 2019-02-05 MED ORDER — TESTOSTERONE 20.25 MG/ACT (1.62%) TD GEL: TRANSDERMAL | 0 refills | Status: DC

## 2019-02-05 MED ORDER — ONDANSETRON HCL 4 MG PO TABS: ORAL_TABLET | ORAL | 2 refills | Status: DC

## 2019-02-05 MED ORDER — LISINOPRIL 5 MG PO TABS: 5.0000 mg | ORAL_TABLET | Freq: Every day | ORAL | 1 refills | Status: DC

## 2019-02-05 MED ORDER — MECLIZINE HCL 12.5 MG PO TABS: 12.5000 mg | ORAL_TABLET | Freq: Two times a day (BID) | ORAL | 0 refills | Status: DC | PRN

## 2019-02-05 NOTE — Progress Notes (Signed)
Virtual Visit via telephone Note Due to COVID-19 pandemic this visit was conducted virtually. This visit type was conducted due to national recommendations for restrictions regarding the COVID-19 Pandemic (e.g. social distancing, sheltering in place) in an effort to limit this patient's exposure and mitigate transmission in our community. All issues noted in this document were discussed and addressed.  A physical exam was not performed with this format.  I connected with Patrick Novak on 02/05/19 at 3:00 by telephone and verified that I am speaking with the correct person using two identifiers. Patrick Novak is currently located at home and no one is currently with her during visit. The provider, [name], FNP is located in their office at time of visit.  I discussed the limitations, risks, security and privacy concerns of performing an evaluation and management service by telephone and the availability of in person appointments. I also discussed with the patient that there may be a patient responsible charge related to this service. The patient expressed understanding and agreed to proceed.   History and Present Illness:   Chief Complaint: No chief complaint on file.    HPI:  1. Essential hypertension No c/o chest pain, sob or headache. Does not check blood pressure at home. BP Readings from Last 3 Encounters:  06/01/18 (!) 146/69  05/12/18 (!) 116/54  05/05/18 125/64     2. Gastroesophageal reflux disease without esophagitis Is on omeprazole daily which seems to work well to keep symptoms under ocntrol  3. Other emphysema ([place]) Denies any SOB today. No cough. Uses his BREO daily. Say pulmonology last week and was told he was ddoing well. Has to follow up in 1 year.  4. Osteopenia of neck of femur, unspecified laterality Last dexascan was done 12/08/16 with t score of -1.7. takes vitamin d and calcium daily  5. Bradycardia No syncopial or near syncopial episodes.   6. dizziness Takes meclizine and zofran when  Needed.  Outpatient Encounter Medications as of 02/05/2019  Medication Sig  . aspirin 81 MG EC tablet Take 81 mg by mouth daily.    . Calcium Carbonate-Vitamin D 600-400 MG-UNIT per tablet Take 1 tablet by mouth daily.   . fluticasone furoate-vilanterol (BREO ELLIPTA) 200-25 MCG/INH AEPB Inhale 1 puff into the lungs daily.  [name] lidocaine (XYLOCAINE) 2 % solution Use as directed 15 mLs in the mouth or throat as needed for mouth pain.  [name] lisinopril (ZESTRIL) 5 MG tablet TAKE 1 TABLET BY MOUTH  DAILY  . meclizine (ANTIVERT) 12.5 MG tablet Take 1 tablet (12.5 mg total) by mouth 2 (two) times daily as needed.  . meloxicam (MOBIC) 15 MG tablet Take 1 tablet (15 mg total) by mouth daily. (Patient taking differently: Take 15 mg by mouth daily as needed. )  . Multiple Vitamin (MULTIVITAMIN) tablet Take 1 tablet by mouth daily.    [name] omeprazole (PRILOSEC) 20 MG capsule Take 1 capsule (20 mg total) by mouth daily.  . ondansetron (ZOFRAN) 4 MG tablet TAKE 1/2 TO 1 (ONE-HALF TO ONE) TABLET BY MOUTH TWICE DAILY AS NEEDED  . Testosterone 20.25 MG/ACT (1.62%) GEL APPLY 2 PUMPS ONCE DAILY TOPICALLY IN THE MORNING   No facility-administered encounter medications on file as of 02/05/2019.     Past Surgical History:  Procedure Laterality Date  . CHOLECYSTECTOMY  1995  . 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 Stroke Father   . Heart disease Mother   .  Stroke Brother   . Hip fracture Sister   . Cancer Sister        breast  . Heart disease Sister   . Hepatitis Sister   . Valvular heart disease Sister   . Alzheimer's disease Sister   . Lung disease Sister   . Early death Brother     New complaints: none  Social history: Stays active by playing golf several days a week.  Controlled substance contract: N/A    Review of Systems  Constitutional: Negative for diaphoresis and weight loss.  Eyes:  Negative for blurred vision, double vision and pain.  Respiratory: Negative for shortness of breath.   Cardiovascular: Negative for chest pain, palpitations, orthopnea and leg swelling.  Gastrointestinal: Negative for abdominal pain.  Skin: Negative for rash.  Neurological: Negative for dizziness, sensory change, loss of consciousness, weakness and headaches.  Endo/Heme/Allergies: Negative for polydipsia. Does not bruise/bleed easily.  Psychiatric/Behavioral: Negative for memory loss. The patient does not have insomnia.   All other systems reviewed and are negative.    Observations/Objective: Alert and oriented- answers al questions appropriately nodistress  Assessment and Plan: Patrick Novak comes in today with chief complaint of No chief complaint on file.   Diagnosis and orders addressed:  1. Essential hypertension Low sodium diet - lisinopril (ZESTRIL) 5 MG tablet; Take 1 tablet (5 mg total) by mouth daily.  Dispense: 90 tablet; Refill: 1  2. Gastroesophageal reflux disease without esophagitis Avoid spicy foods Do not eat 2 hours prior to bedtime  3. Other emphysema ([place]) contnieu BREO daily Avoid being out in heat  4. Osteopenia of neck of femur, unspecified laterality Weight bearing exercises  5. Bradycardia  6. Dizziness - meclizine (ANTIVERT) 12.5 MG tablet; Take 1 tablet (12.5 mg total) by mouth 2 (two) times daily as needed.  Dispense: 180 tablet; Refill: 0 - ondansetron (ZOFRAN) 4 MG tablet; TAKE 1/2 TO 1 (ONE-HALF TO ONE) TABLET BY MOUTH TWICE DAILY AS NEEDED  Dispense: 12 tablet; Refill: 2  7. Low testosterone - Testosterone 20.25 MG/ACT (1.62%) GEL; APPLY 2 PUMPS ONCE DAILY TOPICALLY IN THE MORNING  Dispense: 75 g; Refill: 0   Labs pending Health Maintenance reviewed Diet and exercise encouraged  Follow up plan: 3 months    I discussed the assessment and treatment plan with the patient. The patient was provided an opportunity to ask questions and  all were answered. The patient agreed with the plan and demonstrated an understanding of the instructions.   The patient was advised to call back or seek an in-person evaluation if the symptoms worsen or if the condition fails to improve as anticipated.  The above assessment and management plan was discussed with the patient. The patient verbalized understanding of and has agreed to the management plan. Patient is aware to call the clinic if symptoms persist or worsen. Patient is aware when to return to the clinic for a follow-up visit. Patient educated on when it is appropriate to go to the emergency department.   Time call ended:  3:12  I provided 12 minutes of non-face-to-face time during this encounter.    [name], FNP

## 2019-02-16 ENCOUNTER — Other Ambulatory Visit: Payer: Self-pay

## 2019-02-16 ENCOUNTER — Encounter (INDEPENDENT_AMBULATORY_CARE_PROVIDER_SITE_OTHER): Payer: Medicare Other | Admitting: Ophthalmology

## 2019-02-16 DIAGNOSIS — H353122 Nonexudative age-related macular degeneration, left eye, intermediate dry stage: Secondary | ICD-10-CM

## 2019-02-16 DIAGNOSIS — H43813 Vitreous degeneration, bilateral: Secondary | ICD-10-CM

## 2019-02-16 DIAGNOSIS — H353211 Exudative age-related macular degeneration, right eye, with active choroidal neovascularization: Secondary | ICD-10-CM | POA: Diagnosis not present

## 2019-02-19 IMAGING — DX DG CHEST 2V
2 series · 2 of 2 positions shown · non-contrast
Comparison: 08/29/2015

CLINICAL DATA: COPD

EXAM:
CHEST  2 VIEW

[chest pa]
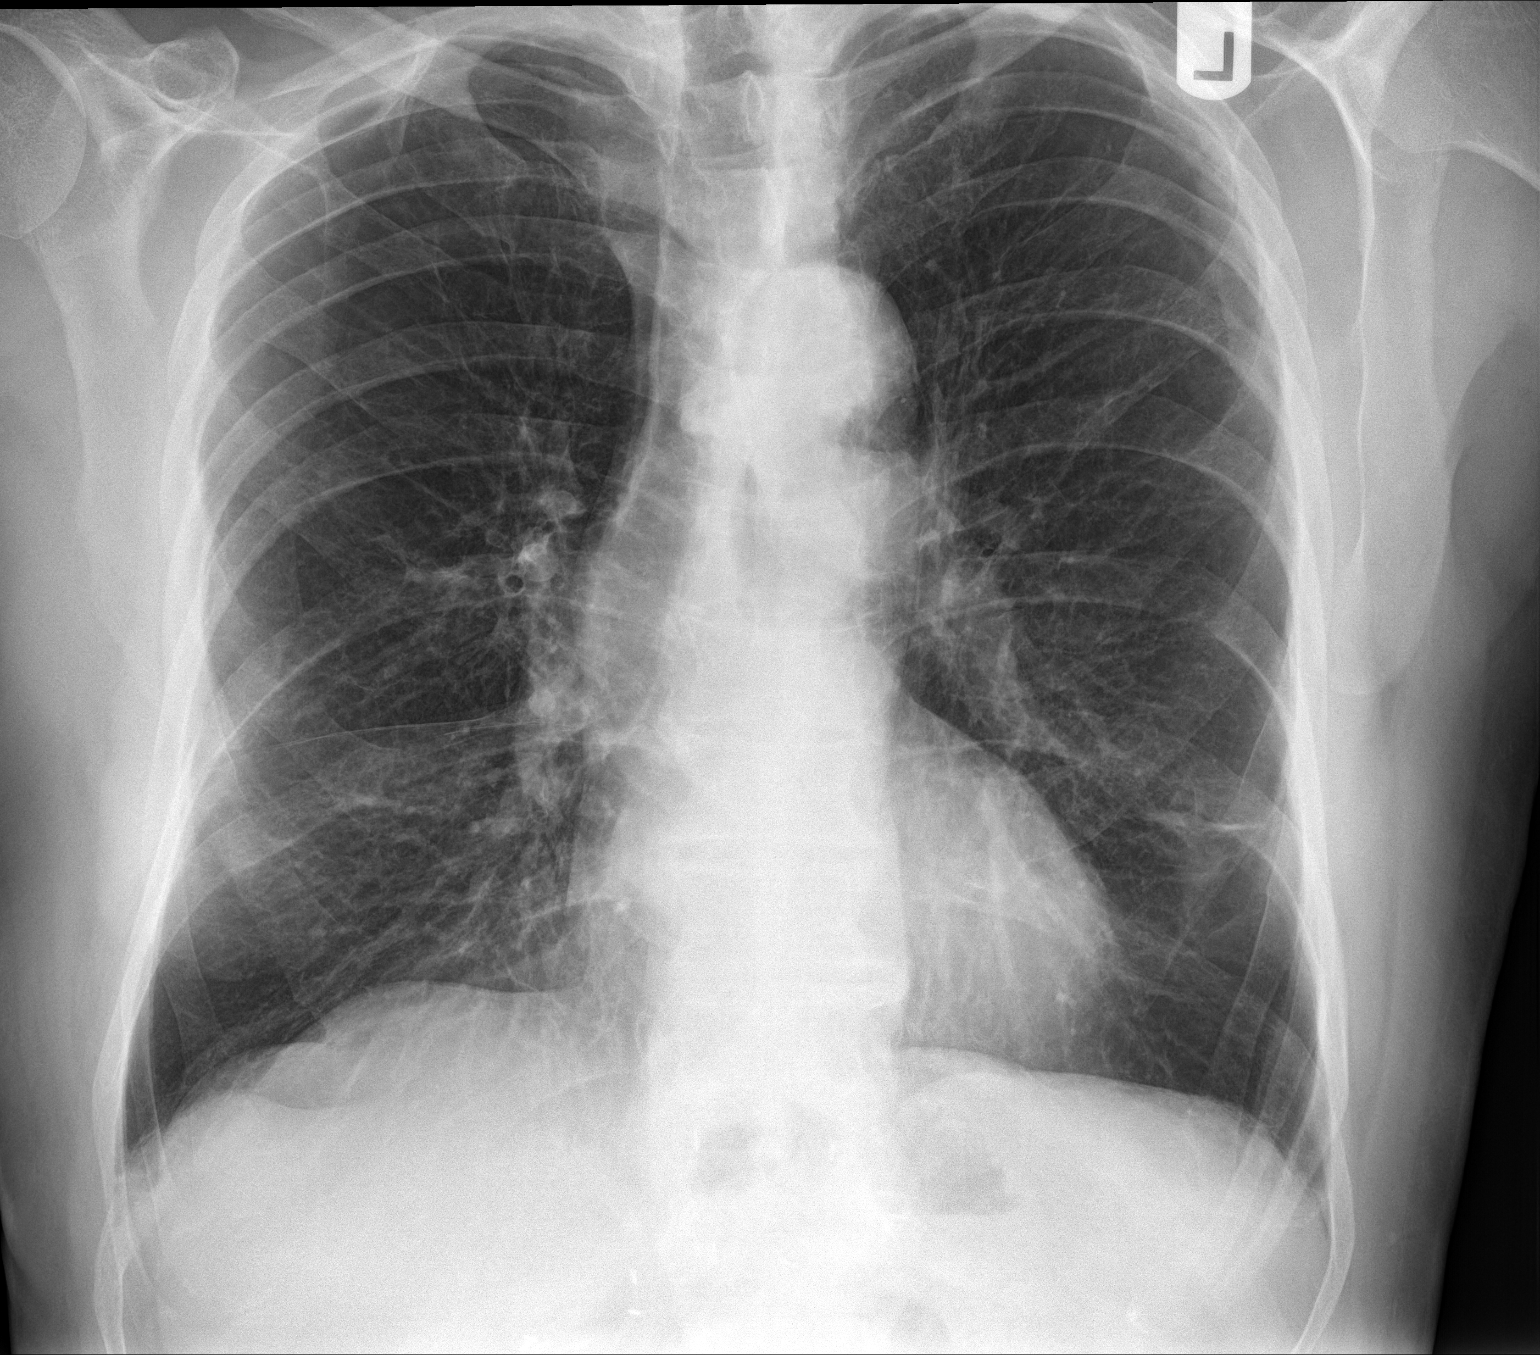

[chest lat]
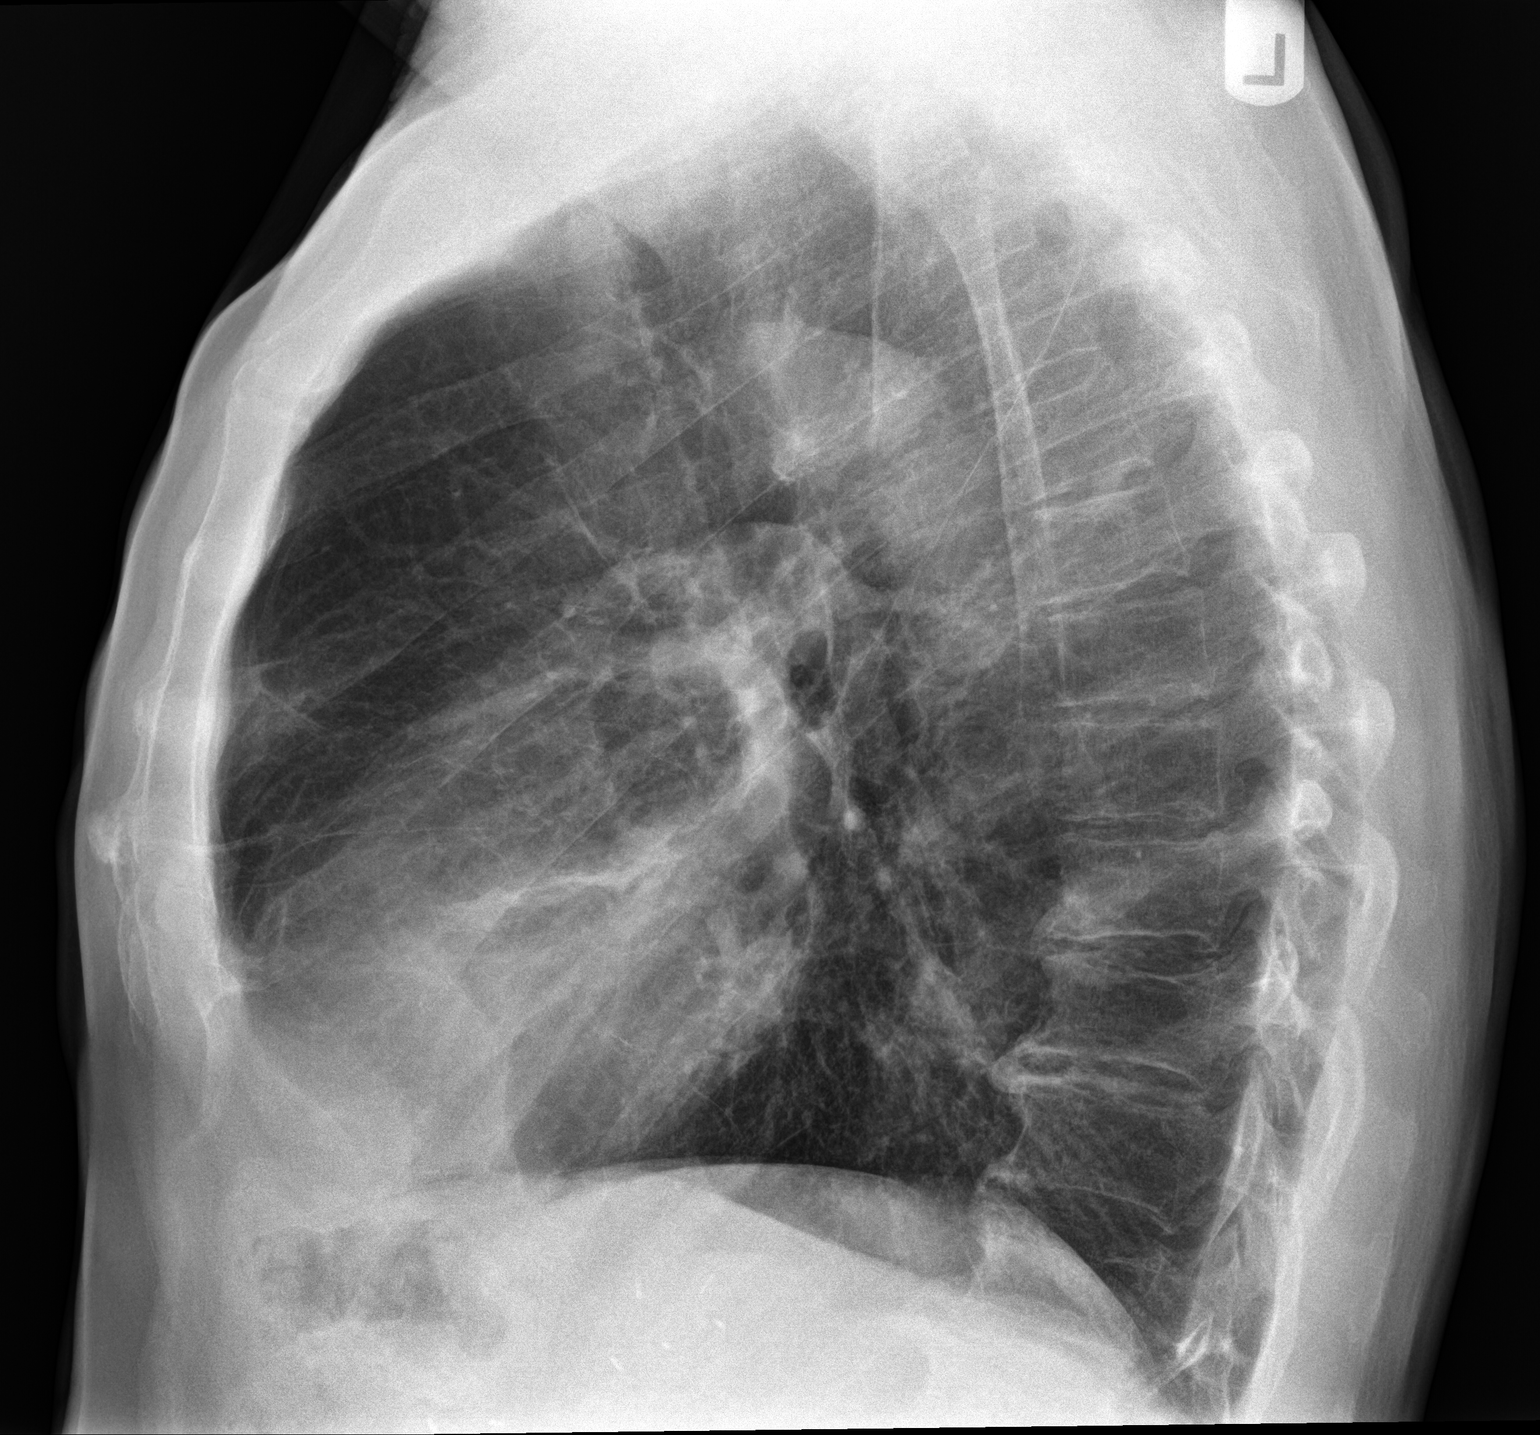

[2 of 2 positions shown; findings below may reference images not displayed]

FINDINGS: Cardiac shadow is within normal limits. The lungs are hyper inflated
bilaterally. Mild scarring is again noted in the left base.
Bilateral nipple shadows are seen. Mild degenerative changes of
thoracic spine are noted.
IMPRESSION: COPD without acute abnormality.

## 2019-03-01 DIAGNOSIS — J449 Chronic obstructive pulmonary disease, unspecified: Secondary | ICD-10-CM | POA: Diagnosis not present

## 2019-03-16 ENCOUNTER — Other Ambulatory Visit: Payer: Self-pay

## 2019-03-16 ENCOUNTER — Encounter (INDEPENDENT_AMBULATORY_CARE_PROVIDER_SITE_OTHER): Payer: Medicare Other | Admitting: Ophthalmology

## 2019-03-16 ENCOUNTER — Other Ambulatory Visit: Payer: Self-pay | Admitting: Nurse Practitioner

## 2019-03-16 DIAGNOSIS — H353122 Nonexudative age-related macular degeneration, left eye, intermediate dry stage: Secondary | ICD-10-CM

## 2019-03-16 DIAGNOSIS — I10 Essential (primary) hypertension: Secondary | ICD-10-CM

## 2019-03-16 DIAGNOSIS — H43813 Vitreous degeneration, bilateral: Secondary | ICD-10-CM

## 2019-03-16 DIAGNOSIS — H353211 Exudative age-related macular degeneration, right eye, with active choroidal neovascularization: Secondary | ICD-10-CM | POA: Diagnosis not present

## 2019-03-28 ENCOUNTER — Other Ambulatory Visit: Payer: Self-pay

## 2019-03-28 ENCOUNTER — Ambulatory Visit (INDEPENDENT_AMBULATORY_CARE_PROVIDER_SITE_OTHER): Payer: Medicare Other

## 2019-03-28 DIAGNOSIS — Z23 Encounter for immunization: Secondary | ICD-10-CM | POA: Diagnosis not present

## 2019-03-31 DIAGNOSIS — J449 Chronic obstructive pulmonary disease, unspecified: Secondary | ICD-10-CM | POA: Diagnosis not present

## 2019-04-09 ENCOUNTER — Other Ambulatory Visit: Payer: Self-pay | Admitting: Nurse Practitioner

## 2019-04-09 DIAGNOSIS — R7989 Other specified abnormal findings of blood chemistry: Secondary | ICD-10-CM

## 2019-04-20 ENCOUNTER — Encounter (INDEPENDENT_AMBULATORY_CARE_PROVIDER_SITE_OTHER): Payer: Medicare Other | Admitting: Ophthalmology

## 2019-04-20 ENCOUNTER — Other Ambulatory Visit: Payer: Self-pay

## 2019-04-20 DIAGNOSIS — H353211 Exudative age-related macular degeneration, right eye, with active choroidal neovascularization: Secondary | ICD-10-CM

## 2019-04-20 DIAGNOSIS — H353122 Nonexudative age-related macular degeneration, left eye, intermediate dry stage: Secondary | ICD-10-CM | POA: Diagnosis not present

## 2019-04-20 DIAGNOSIS — H43813 Vitreous degeneration, bilateral: Secondary | ICD-10-CM

## 2019-04-25 ENCOUNTER — Ambulatory Visit (INDEPENDENT_AMBULATORY_CARE_PROVIDER_SITE_OTHER): Payer: Medicare Other | Admitting: *Deleted

## 2019-04-25 DIAGNOSIS — Z0000 Encounter for general adult medical examination without abnormal findings: Secondary | ICD-10-CM | POA: Diagnosis not present

## 2019-04-25 NOTE — Patient Instructions (Signed)
Preventive Care 75 Years and Older, Male Preventive care refers to lifestyle choices and visits with your health care provider that can promote health and wellness. This includes:  A yearly physical exam. This is also called an annual well check.  Regular dental and eye exams.  Immunizations.  Screening for certain conditions.  Healthy lifestyle choices, such as diet and exercise. What can I expect for my preventive care visit? Physical exam Your health care provider will check:  Height and weight. These may be used to calculate body mass index (BMI), which is a measurement that tells if you are at a healthy weight.  Heart rate and blood pressure.  Your skin for abnormal spots. Counseling Your health care provider may ask you questions about:  Alcohol, tobacco, and drug use.  Emotional well-being.  Home and relationship well-being.  Sexual activity.  Eating habits.  History of falls.  Memory and ability to understand (cognition).  Work and work Statistician. What immunizations do I need?  Influenza (flu) vaccine  This is recommended every year. Tetanus, diphtheria, and pertussis (Tdap) vaccine  You may need a Td booster every 10 years. Varicella (chickenpox) vaccine  You may need this vaccine if you have not already been vaccinated. Zoster (shingles) vaccine  You may need this after age 50. Pneumococcal conjugate (PCV13) vaccine  One dose is recommended after age 24. Pneumococcal polysaccharide (PPSV23) vaccine  One dose is recommended after age 33. Measles, mumps, and rubella (MMR) vaccine  You may need at least one dose of MMR if you were born in 1957 or later. You may also need a second dose. Meningococcal conjugate (MenACWY) vaccine  You may need this if you have certain conditions. Hepatitis A vaccine  You may need this if you have certain conditions or if you travel or work in places where you may be exposed to hepatitis A. Hepatitis B vaccine   You may need this if you have certain conditions or if you travel or work in places where you may be exposed to hepatitis B. Haemophilus influenzae type b (Hib) vaccine  You may need this if you have certain conditions. You may receive vaccines as individual doses or as more than one vaccine together in one shot (combination vaccines). Talk with your health care provider about the risks and benefits of combination vaccines. What tests do I need? Blood tests  Lipid and cholesterol levels. These may be checked every 5 years, or more frequently depending on your overall health.  Hepatitis C test.  Hepatitis B test. Screening  Lung cancer screening. You may have this screening every year starting at age 74 if you have a 30-pack-year history of smoking and currently smoke or have quit within the past 15 years.  Colorectal cancer screening. All adults should have this screening starting at age 57 and continuing until age 54. Your health care provider may recommend screening at age 47 if you are at increased risk. You will have tests every 1-10 years, depending on your results and the type of screening test.  Prostate cancer screening. Recommendations will vary depending on your family history and other risks.  Diabetes screening. This is done by checking your blood sugar (glucose) after you have not eaten for a while (fasting). You may have this done every 1-3 years.  Abdominal aortic aneurysm (AAA) screening. You may need this if you are a current or former smoker.  Sexually transmitted disease (STD) testing. Follow these instructions at home: Eating and drinking  Eat  a diet that includes fresh fruits and vegetables, whole grains, lean protein, and low-fat dairy products. Limit your intake of foods with high amounts of sugar, saturated fats, and salt.  Take vitamin and mineral supplements as recommended by your health care provider.  Do not drink alcohol if your health care provider  tells you not to drink.  If you drink alcohol: ? Limit how much you have to 0-2 drinks a day. ? Be aware of how much alcohol is in your drink. In the [place] one drink equals one 12 oz bottle of beer (355 mL), one 5 oz glass of wine (148 mL), or one 1 oz glass of hard liquor (44 mL). Lifestyle  Take daily care of your teeth and gums.  Stay active. Exercise for at least 30 minutes on 5 or more days each week.  Do not use any products that contain nicotine or tobacco, such as cigarettes, e-cigarettes, and chewing tobacco. If you need help quitting, ask your health care provider.  If you are sexually active, practice safe sex. Use a condom or other form of protection to prevent STIs (sexually transmitted infections).  Talk with your health care provider about taking a low-dose aspirin or statin. What's next?  Visit your health care provider once a year for a well check visit.  Ask your health care provider how often you should have your eyes and teeth checked.  Stay up to date on all vaccines. This information is not intended to replace advice given to you by your health care provider. Make sure you discuss any questions you have with your health care provider. Document Released: 07/04/2015 Document Revised: 06/01/2018 Document Reviewed: 06/01/2018 Elsevier Patient Education  2020 Elsevier Inc.  

## 2019-04-25 NOTE — Progress Notes (Signed)
MEDICARE ANNUAL WELLNESS VISIT  04/25/2019  Telephone Visit Disclaimer This Medicare AWV was conducted by telephone due to national recommendations for restrictions regarding the COVID-19 Pandemic (e.g. social distancing).  I verified, using two identifiers, that I am speaking with Patrick Novak or their authorized healthcare agent. I discussed the limitations, risks, security, and privacy concerns of performing an evaluation and management service by telephone and the potential availability of an in-person appointment in the future. The patient expressed understanding and agreed to proceed.   Subjective:  Patrick Novak is a 78 y.o. male patient of Patrick Novak, [place] who had a Medicare Annual Wellness Visit today via telephone. Tahir is Retired and lives with their spouse. he has 4 children. he reports that he is socially active and does interact with friends/family regularly. he is minimally physically active and enjoys playing golf at least twice a week.  Patient Care Team: Patrick Pretty, FNP as PCP - General (Family Medicine) Larey Dresser, MD as Consulting Physician (Cardiology) Lavonna Monarch, MD as Consulting Physician (Dermatology) Sinda Du, MD as Consulting Physician (Pulmonary Disease)  Advanced Directives 04/25/2019 03/30/2018 03/30/2018 03/17/2017 03/10/2016 09/20/2014  Does Patient Have a Medical Advance Directive? No - No No No No  Would patient like information on creating a medical advance directive? No - Patient declined Yes (MAU/Ambulatory/Procedural Areas - Information given) No - Patient declined Yes (ED - Information included in AVS) Yes - Educational materials given Yes - Educational materials given    Hospital Utilization Over the Past 12 Months: # of hospitalizations or ER visits: 0 # of surgeries: 0  Review of Systems    Patient reports that his overall health is unchanged compared to last year.  History obtained from chart review   Patient Reported Readings (BP, Pulse, CBG, Weight, etc) none  Pain Assessment Pain : No/denies pain     Current Medications & Allergies (verified) Allergies as of 04/25/2019      Reactions   Codeine Nausea And Vomiting, Other (See Comments)   disoriented   Ciprofloxacin Nausea Only   Doxycycline Nausea And Vomiting   Sulfa Antibiotics Nausea And Vomiting   Nausea    Zithromax [azithromycin]    abd pain      Medication List       Accurate as of April 25, 2019 10:01 AM. If you have any questions, ask your nurse or doctor.        STOP taking these medications   aspirin 81 MG EC tablet   lidocaine 2 % solution Commonly known as: XYLOCAINE     TAKE these medications   Calcium Carbonate-Vitamin D 600-400 MG-UNIT tablet Take 1 tablet by mouth daily.   fluticasone furoate-vilanterol 200-25 MCG/INH Aepb Commonly known as: Breo Ellipta Inhale 1 puff into the lungs daily.   lisinopril 5 MG tablet Commonly known as: ZESTRIL TAKE 1 TABLET BY MOUTH  DAILY   meclizine 12.5 MG tablet Commonly known as: ANTIVERT Take 1 tablet (12.5 mg total) by mouth 2 (two) times daily as needed.   meloxicam 15 MG tablet Commonly known as: MOBIC Take 1 tablet (15 mg total) by mouth daily. What changed:   when to take this  reasons to take this   multivitamin tablet Take 1 tablet by mouth daily.   omeprazole 20 MG capsule Commonly known as: PRILOSEC Take 1 capsule (20 mg total) by mouth daily.   ondansetron 4 MG tablet Commonly known as: ZOFRAN TAKE 1/2 TO 1 (ONE-HALF TO ONE)  TABLET BY MOUTH TWICE DAILY AS NEEDED   PRESERVISION AREDS PO Take by mouth.   Testosterone 20.25 MG/ACT (1.62%) Gel APPLY 2 PUMPS ONCE DAILY TOPICALLY IN THE MORNING       History (reviewed): Past Medical History:  Diagnosis Date  . Bradycardia    asympotmatic. Holter (2/11) with PACs, 1 run of 5 beats NSVT, HR range 41-125 with no pauses.   . 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 COPD  (chronic obstructive pulmonary disease) ([place])   . ED (erectile dysfunction)   . Gastric ulcer   . GERD (gastroesophageal reflux disease)   . Hiatal hernia   . History of stomach cancer    surgery in 2001  . HTN (hypertension)   . Low testosterone   . MR (mitral regurgitation)    echo 2/11: EF 60%, no regional wall abnormalities, mild MR, mild to moderate RV dialation, mild to moderate RV dysfunction   . Osteopenia   . Vertigo   . Vertigo   . Vitamin D deficiency    Past Surgical History:  Procedure Laterality Date  . CHOLECYSTECTOMY  1995  . radical subtotal gastrectomy  09/1999   Family History  Problem Relation Age of Onset  . Stroke Father   . Heart disease Mother   . Stroke Brother   . Hip fracture Sister   . Cancer Sister        breast  . Heart disease Sister   . Hepatitis Sister   . Valvular heart disease Sister   . Alzheimer's disease Sister   . Lung disease Sister   . Early death Brother    Social History   Socioeconomic History  . Marital status: Married    Spouse name: Patrick Novak  . Number of children: 4  . Years of education: 38  . Highest education level: GED or equivalent  Occupational History  . Occupation: retired  Scientific laboratory technician  . Financial resource strain: Not hard at all  . Food insecurity    Worry: Never true    Inability: Never true  . Transportation needs    Medical: No    Non-medical: No  Tobacco Use  . Smoking status: Former Smoker    Packs/day: 1.50    Years: 47.00    Pack years: 70.50    Types: Cigarettes    Quit date: 08/21/1998    Years since quitting: 20.6  . Smokeless tobacco: Never Used  . Tobacco comment: quit in 2000  Substance and Sexual Activity  . Alcohol use: No  . Drug use: No  . Sexual activity: Yes    Birth control/protection: None  Lifestyle  . Physical activity    Days per week: 0 days    Minutes per session: 0 min  . Stress: Not at all  Relationships  . Social connections    Talks on phone: More than three  times a week    Gets together: Three times a week    Attends religious service: More than 4 times per year    Active member of club or organization: Yes    Attends meetings of clubs or organizations: More than 4 times per year    Relationship status: Married  Other Topics Concern  . Not on file  Social History Narrative   Married, lives in [place].   Retired Psychologist, sport and exercise, does some carpentry.     Activities of Daily Living In your present state of health, do you have any difficulty performing the following activities:  04/25/2019  Hearing? Y  Comment if there are multiple people talking at once he can't follow along with the conversation  Vision? 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 N  Walking or climbing stairs? N  Dressing or bathing? N  Doing errands, shopping? N  Preparing Food and eating ? N  Using the Toilet? N  In the past six months, have you accidently leaked urine? N  Do you have problems with loss of bowel control? N  Managing your Medications? N  Managing your Finances? Y  Comment wife takes care of all the finances  Housekeeping or managing your Housekeeping? Y  Comment wife does all the housekeeping  Some recent data might be hidden    Patient Education/ Literacy How often do you need to have someone help you when you read instructions, pamphlets, or other written materials from your doctor or pharmacy?: 1 - Never What is the last grade level you completed in school?: 10th grade-went back to get GED  Exercise Current Exercise Habits: The patient does not participate in regular exercise at present, Exercise limited by: respiratory conditions(s)  Diet Patient reports consuming 3 meals a day and 2 snack(s) a day Patient reports that his primary diet is: Regular Patient reports that she does have regular access to food.   Depression Screen PHQ 2/9 Scores 04/25/2019 02/05/2019 06/01/2018  05/12/2018 05/05/2018 03/30/2018 03/18/2018  PHQ - 2 Score 0 0 0 0 0 0 0     Fall Risk Fall Risk  04/25/2019 02/05/2019 06/01/2018 05/12/2018 05/05/2018  Falls in the past year? 0 0 0 0 0     Objective:  Patrick Novak seemed alert and oriented and he participated appropriately during our telephone visit.  Blood Pressure Weight BMI  BP Readings from Last 3 Encounters:  06/01/18 (!) 146/69  05/12/18 (!) 116/54  05/05/18 125/64   Wt Readings from Last 3 Encounters:  06/01/18 176 lb (79.8 kg)  05/12/18 172 lb (78 kg)  05/05/18 169 lb (76.7 kg)   BMI Readings from Last 1 Encounters:  06/01/18 25.99 kg/m    *Unable to obtain current vital signs, weight, and BMI due to telephone visit type  Hearing/Vision  . [place] did not seem to have difficulty with hearing/understanding during the telephone conversation . Reports that he has had a formal eye exam by an eye care professional within the past year . Reports that he has not had a formal hearing evaluation within the past year *Unable to fully assess hearing and vision during telephone visit type  Cognitive Function: 6CIT Screen 04/25/2019  What Year? 0 points  What month? 0 points  What time? 0 points  Count back from 20 0 points  Months in reverse 0 points  Repeat phrase 2 points  Total Score 2   (Normal:0-7, Significant for Dysfunction: >8)  Normal Cognitive Function Screening: Yes   Immunization & Health Maintenance Record Immunization History  Administered Date(s) Administered  . DTaP 07/23/2002  . Fluad Quad(high Dose 65+) 03/28/2019  . H1N1 05/14/2008  . Influenza Whole 03/22/2012  . Influenza, High Dose Seasonal PF 03/17/2017, 03/30/2018  . Influenza,inj,Quad PF,6+ Mos 03/27/2015, 03/10/2016  . Influenza,inj,quad, With Preservative 04/22/2018  . Influenza-Unspecified 05/21/2014  . Pneumococcal Conjugate-13 05/07/2015  . Pneumococcal Polysaccharide-23 08/21/2006  . Tdap 10/22/2013  . Zoster 08/20/2009     Health Maintenance  Topic Date Due  . DEXA SCAN  12/14/2018  . TETANUS/TDAP  11/02/2023  .  INFLUENZA VACCINE  Completed  . PNA vac Low Risk Adult  Completed       Assessment  This is a routine wellness examination for MESHILEM SHURN.  Health Maintenance: Due or Overdue Health Maintenance Due  Topic Date Due  . DEXA SCAN  12/14/2018    Patrick Novak does not need a referral for Community Assistance: Care Management:   no Social Work:    no Prescription Assistance:  no Nutrition/Diabetes Education:  no   Plan:  Personalized Goals Goals Addressed            This Visit's Progress   . DIET - INCREASE WATER INTAKE       Try to drink 6-8 glasses of water daily      Personalized Health Maintenance & Screening Recommendations  Bone densitometry screening  Lung Cancer Screening Recommended: no (Low Dose CT Chest recommended if Age 61-80 years, 30 pack-year currently smoking OR have quit w/in past 15 years) Hepatitis C Screening recommended: no HIV Screening recommended: no  Advanced Directives: Written information was not prepared per patient's request.  Referrals & Orders No orders of the defined types were placed in this encounter.   Follow-up Plan . Follow-up with Patrick Pretty, FNP as planned . Schedule your DEXA scan as discussed   I have personally reviewed and noted the following in the patient's chart:   . Medical and social history . Use of alcohol, tobacco or illicit drugs  . Current medications and supplements . Functional ability and status . Nutritional status . Physical activity . Advanced directives . List of other physicians . Hospitalizations, surgeries, and ER visits in previous 12 months . Vitals . Screenings to include cognitive, depression, and falls . Referrals and appointments  In addition, I have reviewed and discussed with Patrick Novak certain preventive protocols, quality metrics, and best practice recommendations. A  written personalized care plan for preventive services as well as general preventive health recommendations is available and can be mailed to the patient at his request.      Milas Hock, LPN  [id number]

## 2019-05-01 DIAGNOSIS — J449 Chronic obstructive pulmonary disease, unspecified: Secondary | ICD-10-CM | POA: Diagnosis not present

## 2019-05-14 ENCOUNTER — Other Ambulatory Visit: Payer: Self-pay | Admitting: Nurse Practitioner

## 2019-05-14 DIAGNOSIS — J438 Other emphysema: Secondary | ICD-10-CM

## 2019-05-25 ENCOUNTER — Encounter (INDEPENDENT_AMBULATORY_CARE_PROVIDER_SITE_OTHER): Payer: Medicare Other | Admitting: Ophthalmology

## 2019-05-25 DIAGNOSIS — H353211 Exudative age-related macular degeneration, right eye, with active choroidal neovascularization: Secondary | ICD-10-CM

## 2019-05-25 DIAGNOSIS — H43813 Vitreous degeneration, bilateral: Secondary | ICD-10-CM | POA: Diagnosis not present

## 2019-05-25 DIAGNOSIS — H353122 Nonexudative age-related macular degeneration, left eye, intermediate dry stage: Secondary | ICD-10-CM

## 2019-05-31 DIAGNOSIS — J449 Chronic obstructive pulmonary disease, unspecified: Secondary | ICD-10-CM | POA: Diagnosis not present

## 2019-06-06 ENCOUNTER — Other Ambulatory Visit: Payer: Self-pay | Admitting: Nurse Practitioner

## 2019-06-06 DIAGNOSIS — R7989 Other specified abnormal findings of blood chemistry: Secondary | ICD-10-CM

## 2019-06-18 ENCOUNTER — Other Ambulatory Visit: Payer: Self-pay | Admitting: Nurse Practitioner

## 2019-06-18 DIAGNOSIS — J438 Other emphysema: Secondary | ICD-10-CM

## 2019-06-29 ENCOUNTER — Encounter (INDEPENDENT_AMBULATORY_CARE_PROVIDER_SITE_OTHER): Payer: Medicare Other | Admitting: Ophthalmology

## 2019-07-01 DIAGNOSIS — J449 Chronic obstructive pulmonary disease, unspecified: Secondary | ICD-10-CM | POA: Diagnosis not present

## 2019-07-06 ENCOUNTER — Encounter (INDEPENDENT_AMBULATORY_CARE_PROVIDER_SITE_OTHER): Payer: Medicare Other | Admitting: Ophthalmology

## 2019-07-06 ENCOUNTER — Other Ambulatory Visit: Payer: Self-pay

## 2019-07-06 DIAGNOSIS — H353122 Nonexudative age-related macular degeneration, left eye, intermediate dry stage: Secondary | ICD-10-CM | POA: Diagnosis not present

## 2019-07-06 DIAGNOSIS — H353211 Exudative age-related macular degeneration, right eye, with active choroidal neovascularization: Secondary | ICD-10-CM | POA: Diagnosis not present

## 2019-07-06 DIAGNOSIS — H43813 Vitreous degeneration, bilateral: Secondary | ICD-10-CM | POA: Diagnosis not present

## 2019-07-11 DIAGNOSIS — Z23 Encounter for immunization: Secondary | ICD-10-CM | POA: Diagnosis not present

## 2019-07-28 ENCOUNTER — Other Ambulatory Visit: Payer: Self-pay | Admitting: Nurse Practitioner

## 2019-07-28 DIAGNOSIS — J438 Other emphysema: Secondary | ICD-10-CM

## 2019-08-01 DIAGNOSIS — J449 Chronic obstructive pulmonary disease, unspecified: Secondary | ICD-10-CM | POA: Diagnosis not present

## 2019-08-03 ENCOUNTER — Other Ambulatory Visit: Payer: Self-pay

## 2019-08-03 ENCOUNTER — Encounter (INDEPENDENT_AMBULATORY_CARE_PROVIDER_SITE_OTHER): Payer: Medicare Other | Admitting: Ophthalmology

## 2019-08-03 DIAGNOSIS — H353122 Nonexudative age-related macular degeneration, left eye, intermediate dry stage: Secondary | ICD-10-CM | POA: Diagnosis not present

## 2019-08-03 DIAGNOSIS — H43813 Vitreous degeneration, bilateral: Secondary | ICD-10-CM | POA: Diagnosis not present

## 2019-08-03 DIAGNOSIS — H353211 Exudative age-related macular degeneration, right eye, with active choroidal neovascularization: Secondary | ICD-10-CM | POA: Diagnosis not present

## 2019-08-08 ENCOUNTER — Other Ambulatory Visit: Payer: Self-pay | Admitting: Nurse Practitioner

## 2019-08-08 DIAGNOSIS — R7989 Other specified abnormal findings of blood chemistry: Secondary | ICD-10-CM

## 2019-08-14 ENCOUNTER — Other Ambulatory Visit: Payer: Self-pay | Admitting: *Deleted

## 2019-08-14 DIAGNOSIS — K219 Gastro-esophageal reflux disease without esophagitis: Secondary | ICD-10-CM

## 2019-08-14 MED ORDER — OMEPRAZOLE 20 MG PO CPDR: 20.0000 mg | DELAYED_RELEASE_CAPSULE | Freq: Every day | ORAL | 0 refills | Status: DC

## 2019-08-15 ENCOUNTER — Other Ambulatory Visit: Payer: Self-pay | Admitting: Nurse Practitioner

## 2019-08-15 DIAGNOSIS — K219 Gastro-esophageal reflux disease without esophagitis: Secondary | ICD-10-CM

## 2019-08-15 MED ORDER — OMEPRAZOLE 20 MG PO CPDR: 20.0000 mg | DELAYED_RELEASE_CAPSULE | Freq: Every day | ORAL | 1 refills | Status: DC

## 2019-08-29 DIAGNOSIS — J449 Chronic obstructive pulmonary disease, unspecified: Secondary | ICD-10-CM | POA: Diagnosis not present

## 2019-09-07 ENCOUNTER — Encounter (INDEPENDENT_AMBULATORY_CARE_PROVIDER_SITE_OTHER): Payer: Medicare Other | Admitting: Ophthalmology

## 2019-09-07 DIAGNOSIS — H353122 Nonexudative age-related macular degeneration, left eye, intermediate dry stage: Secondary | ICD-10-CM

## 2019-09-07 DIAGNOSIS — H353211 Exudative age-related macular degeneration, right eye, with active choroidal neovascularization: Secondary | ICD-10-CM

## 2019-09-07 DIAGNOSIS — H43813 Vitreous degeneration, bilateral: Secondary | ICD-10-CM | POA: Diagnosis not present

## 2019-09-10 DIAGNOSIS — H40051 Ocular hypertension, right eye: Secondary | ICD-10-CM | POA: Diagnosis not present

## 2019-09-10 DIAGNOSIS — H40023 Open angle with borderline findings, high risk, bilateral: Secondary | ICD-10-CM | POA: Diagnosis not present

## 2019-09-13 ENCOUNTER — Other Ambulatory Visit: Payer: Self-pay

## 2019-09-13 ENCOUNTER — Ambulatory Visit (INDEPENDENT_AMBULATORY_CARE_PROVIDER_SITE_OTHER): Payer: Medicare Other | Admitting: Nurse Practitioner

## 2019-09-13 ENCOUNTER — Encounter: Payer: Self-pay | Admitting: Nurse Practitioner

## 2019-09-13 VITALS — BP 130/64 | HR 55 | Temp 98.6°F | Resp 20 | Ht 69.0 in | Wt 168.0 lb

## 2019-09-13 DIAGNOSIS — R7989 Other specified abnormal findings of blood chemistry: Secondary | ICD-10-CM

## 2019-09-13 DIAGNOSIS — K219 Gastro-esophageal reflux disease without esophagitis: Secondary | ICD-10-CM | POA: Diagnosis not present

## 2019-09-13 DIAGNOSIS — I10 Essential (primary) hypertension: Secondary | ICD-10-CM | POA: Diagnosis not present

## 2019-09-13 DIAGNOSIS — J438 Other emphysema: Secondary | ICD-10-CM | POA: Diagnosis not present

## 2019-09-13 DIAGNOSIS — R001 Bradycardia, unspecified: Secondary | ICD-10-CM

## 2019-09-13 MED ORDER — LISINOPRIL 5 MG PO TABS: 5.0000 mg | ORAL_TABLET | Freq: Every day | ORAL | 1 refills | Status: DC

## 2019-09-13 MED ORDER — TESTOSTERONE 20.25 MG/ACT (1.62%) TD GEL: TRANSDERMAL | 5 refills | Status: DC

## 2019-09-13 MED ORDER — OMEPRAZOLE 20 MG PO CPDR: 20.0000 mg | DELAYED_RELEASE_CAPSULE | Freq: Every day | ORAL | 1 refills | Status: DC

## 2019-09-13 NOTE — Patient Instructions (Signed)
Chronic Obstructive Pulmonary Disease Chronic obstructive pulmonary disease (COPD) is a long-term (chronic) lung problem. When you have COPD, it is hard for air to get in and out of your lungs. Usually the condition gets worse over time, and your lungs will never return to normal. There are things you can do to keep yourself as healthy as possible.  Your doctor may treat your condition with: ? Medicines. ? Oxygen. ? Lung surgery.  Your doctor may also recommend: ? Rehabilitation. This includes steps to make your body work better. It may involve a team of specialists. ? Quitting smoking, if you smoke. ? Exercise and changes to your diet. ? Comfort measures (palliative care). Follow these instructions at home: Medicines  Take over-the-counter and prescription medicines only as told by your doctor.  Talk to your doctor before taking any cough or allergy medicines. You may need to avoid medicines that cause your lungs to be dry. Lifestyle  If you smoke, stop. Smoking makes the problem worse. If you need help quitting, ask your doctor.  Avoid being around things that make your breathing worse. This may include smoke, chemicals, and fumes.  Stay active, but remember to rest as well.  Learn and use tips on how to relax.  Make sure you get enough sleep. Most adults need at least 7 hours of sleep every night.  Eat healthy foods. Eat smaller meals more often. Rest before meals. Controlled breathing Learn and use tips on how to control your breathing as told by your doctor. Try:  Breathing in (inhaling) through your nose for 1 second. Then, pucker your lips and breath out (exhale) through your lips for 2 seconds.  Putting one hand on your belly (abdomen). Breathe in slowly through your nose for 1 second. Your hand on your belly should move out. Pucker your lips and breathe out slowly through your lips. Your hand on your belly should move in as you breathe out.  Controlled coughing Learn  and use controlled coughing to clear mucus from your lungs. Follow these steps: 1. Lean your head a little forward. 2. Breathe in deeply. 3. Try to hold your breath for 3 seconds. 4. Keep your mouth slightly open while coughing 2 times. 5. Spit any mucus out into a tissue. 6. Rest and do the steps again 1 or 2 times as needed. General instructions  Make sure you get all the shots (vaccines) that your doctor recommends. Ask your doctor about a flu shot and a pneumonia shot.  Use oxygen therapy and pulmonary rehabilitation if told by your doctor. If you need home oxygen therapy, ask your doctor if you should buy a tool to measure your oxygen level (oximeter).  Make a COPD action plan with your doctor. This helps you to know what to do if you feel worse than usual.  Manage any other conditions you have as told by your doctor.  Avoid going outside when it is very hot, cold, or humid.  Avoid people who have a sickness you can catch (contagious).  Keep all follow-up visits as told by your doctor. This is important. Contact a doctor if:  You cough up more mucus than usual.  There is a change in the color or thickness of the mucus.  It is harder to breathe than usual.  Your breathing is faster than usual.  You have trouble sleeping.  You need to use your medicines more often than usual.  You have trouble doing your normal activities such as getting dressed   or walking around the house. Get help right away if:  You have shortness of breath while resting.  You have shortness of breath that stops you from: ? Being able to talk. ? Doing normal activities.  Your chest hurts for longer than 5 minutes.  Your skin color is more blue than usual.  Your pulse oximeter shows that you have low oxygen for longer than 5 minutes.  You have a fever.  You feel too tired to breathe normally. Summary  Chronic obstructive pulmonary disease (COPD) is a long-term lung problem.  The way your  lungs work will never return to normal. Usually the condition gets worse over time. There are things you can do to keep yourself as healthy as possible.  Take over-the-counter and prescription medicines only as told by your doctor.  If you smoke, stop. Smoking makes the problem worse. This information is not intended to replace advice given to you by your health care provider. Make sure you discuss any questions you have with your health care provider. Document Revised: 05/20/2017 Document Reviewed: 07/12/2016 Elsevier Patient Education  2020 Elsevier Inc.  

## 2019-09-13 NOTE — Addendum Note (Signed)
Addended by: Rolena Infante on: 09/13/2019 04:01 PM   Modules accepted: Orders

## 2019-09-13 NOTE — Progress Notes (Signed)
Subjective:    Patient ID: Patrick Novak, male    DOB: 02-12-1941, 79 y.o.   MRN: [id number]   Chief Complaint: Medical Management of Chronic Issues    HPI:  1. Essential hypertension No c/o chest pain, sob or headache. Does not check blood pressure at home. BP Readings from Last 3 Encounters:  06/01/18 (!) 146/69  05/12/18 (!) 116/54  05/05/18 125/64     2. Gastroesophageal reflux disease without esophagitis Is on omeprazole daily and is doing well.  3. Other emphysema ([place]) He is on BREO daily and is doing well. Says that he does not feel SOB  4. Bradycardia No syncpoal or near syncopal episodes.    Outpatient Encounter Medications as of 09/13/2019  Medication Sig  . BREO ELLIPTA 200-25 MCG/INH AEPB Inhale 1 puff by mouth once daily  . Calcium Carbonate-Vitamin D 600-400 MG-UNIT per tablet Take 1 tablet by mouth daily.   [name] lisinopril (ZESTRIL) 5 MG tablet TAKE 1 TABLET BY MOUTH  DAILY  . meclizine (ANTIVERT) 12.5 MG tablet Take 1 tablet (12.5 mg total) by mouth 2 (two) times daily as needed.  . meloxicam (MOBIC) 15 MG tablet Take 1 tablet (15 mg total) by mouth daily. (Patient taking differently: Take 15 mg by mouth daily as needed. )  . Multiple Vitamin (MULTIVITAMIN) tablet Take 1 tablet by mouth daily.    . Multiple Vitamins-Minerals (PRESERVISION AREDS PO) Take by mouth.  [name] omeprazole (PRILOSEC) 20 MG capsule Take 1 capsule (20 mg total) by mouth daily. (Needs to be seen before next refill)  . ondansetron (ZOFRAN) 4 MG tablet TAKE 1/2 TO 1 (ONE-HALF TO ONE) TABLET BY MOUTH TWICE DAILY AS NEEDED  . Testosterone 20.25 MG/ACT (1.62%) GEL APPLY 2 PUMPS ONCE DAILY TOPICALLY IN THE MORNING     Past Surgical History:  Procedure Laterality Date  . CHOLECYSTECTOMY  1995  . 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 Stroke Father   . Heart disease Mother   . Stroke Brother   . Hip fracture Sister   . Cancer Sister       breast  . Heart disease Sister   . Hepatitis Sister   . Valvular heart disease Sister   . Alzheimer's disease Sister   . Lung disease Sister   . Early death Brother     New complaints: None today  Social history: Lives with wife. Loves to play golf  Controlled substance contract: n/a    Review of Systems     Objective:   Physical Exam Vitals and nursing note reviewed.  Constitutional:      Appearance: Normal appearance. He is well-developed.  HENT:     Head: Normocephalic.     Nose: Nose normal.  Eyes:     Pupils: Pupils are equal, round, and reactive to light.  Neck:     Thyroid: No thyroid mass or thyromegaly.     Vascular: No carotid bruit or JVD.     Trachea: Phonation normal.  Cardiovascular:     Rate and Rhythm: Normal rate and regular rhythm.  Pulmonary:     Effort: Pulmonary effort is normal. No respiratory distress.     Breath sounds: Wheezing (exp throughout ) present.  Abdominal:     General: Bowel sounds are normal.     Palpations: Abdomen is soft.     Tenderness: There is no abdominal tenderness.  Musculoskeletal:  General: Normal range of motion.     Cervical back: Normal range of motion and neck supple.  Lymphadenopathy:     Cervical: No cervical adenopathy.  Skin:    General: Skin is warm and dry.  Neurological:     Mental Status: He is alert and oriented to person, place, and time.  Psychiatric:        Behavior: Behavior normal.        Thought Content: Thought content normal.        Judgment: Judgment normal.    BP 130/64   Pulse (!) 55   Temp 98.6 F (37 C) (Temporal)   Resp 20   Ht 5\' 9"  (1.753 m)   Wt 168 lb (76.2 kg)   SpO2 94%   BMI 24.81 kg/m         Assessment & Plan:  [name] comes in today with chief complaint of Medical Management of Chronic Issues   Diagnosis and orders addressed:  1. Essential hypertension Low sodium diet - lisinopril (ZESTRIL) 5 MG tablet; Take 1 tablet (5 mg total) by  mouth daily.  Dispense: 90 tablet; Refill: 1  2. Gastroesophageal reflux disease without esophagitis Avoid spicy foods Do not eat 2 hours prior to bedtime - omeprazole (PRILOSEC) 20 MG capsule; Take 1 capsule (20 mg total) by mouth daily. (Needs to be seen before next refill)  Dispense: 90 capsule; Refill: 1  3. Other emphysema ([place]) Continue BREO as rx  4. Bradycardia  5. Low testosterone - Testosterone 20.25 MG/ACT (1.62%) GEL; Apply to shoulder area Daily  Dispense: 75 g; Refill: 5   Labs pending Health Maintenance reviewed Diet and exercise encouraged  Follow up plan: 6 months   [place], FNP

## 2019-09-14 ENCOUNTER — Other Ambulatory Visit: Payer: Self-pay | Admitting: Nurse Practitioner

## 2019-09-14 ENCOUNTER — Telehealth: Payer: Self-pay | Admitting: *Deleted

## 2019-09-14 DIAGNOSIS — J438 Other emphysema: Secondary | ICD-10-CM

## 2019-09-14 DIAGNOSIS — R7989 Other specified abnormal findings of blood chemistry: Secondary | ICD-10-CM

## 2019-09-14 LAB — CMP14+EGFR
ALT: 14 IU/L (ref 0–44)
AST: 17 IU/L (ref 0–40)
Albumin/Globulin Ratio: 1.8 (ref 1.2–2.2)
Albumin: 3.9 g/dL (ref 3.7–4.7)
Alkaline Phosphatase: 141 IU/L — ABNORMAL HIGH (ref 39–117)
BUN/Creatinine Ratio: 14 (ref 10–24)
BUN: 19 mg/dL (ref 8–27)
Bilirubin Total: 0.8 mg/dL (ref 0.0–1.2)
CO2: 29 mmol/L (ref 20–29)
Calcium: 9.2 mg/dL (ref 8.6–10.2)
Chloride: 103 mmol/L (ref 96–106)
Creatinine, Ser: 1.37 mg/dL — ABNORMAL HIGH (ref 0.76–1.27)
GFR calc Af Amer: 56 mL/min/{1.73_m2} — ABNORMAL LOW (ref >59)
GFR calc non Af Amer: 49 mL/min/{1.73_m2} — ABNORMAL LOW (ref >59)
Globulin, Total: 2.2 g/dL (ref 1.5–4.5)
Glucose: 85 mg/dL (ref 65–99)
Potassium: 4.7 mmol/L (ref 3.5–5.2)
Sodium: 142 mmol/L (ref 134–144)
Total Protein: 6.1 g/dL (ref 6.0–8.5)

## 2019-09-14 LAB — LIPID PANEL
Chol/HDL Ratio: 3.5 ratio (ref 0.0–5.0)
Cholesterol, Total: 132 mg/dL (ref 100–199)
HDL: 38 mg/dL — ABNORMAL LOW (ref >39)
LDL Chol Calc (NIH): 67 mg/dL (ref 0–99)
Triglycerides: 154 mg/dL — ABNORMAL HIGH (ref 0–149)
VLDL Cholesterol Cal: 27 mg/dL (ref 5–40)

## 2019-09-14 MED ORDER — TESTOSTERONE 20.25 MG/ACT (1.62%) TD GEL: 4.0000 | Freq: Every day | TRANSDERMAL | 5 refills | Status: DC

## 2019-09-14 NOTE — Addendum Note (Signed)
Addended by: Chevis Pretty on: 09/14/2019 04:15 PM   Modules accepted: Orders

## 2019-09-14 NOTE — Telephone Encounter (Signed)
Fax from [facility] Testosterone 20.25 mg/act (1.62%) gel Please clarify how many pumps a day patient is doing

## 2019-09-15 DIAGNOSIS — J449 Chronic obstructive pulmonary disease, unspecified: Secondary | ICD-10-CM | POA: Diagnosis not present

## 2019-09-15 DIAGNOSIS — Z881 Allergy status to other antibiotic agents status: Secondary | ICD-10-CM | POA: Diagnosis not present

## 2019-09-15 DIAGNOSIS — Z85828 Personal history of other malignant neoplasm of skin: Secondary | ICD-10-CM | POA: Diagnosis not present

## 2019-09-15 DIAGNOSIS — Z79899 Other long term (current) drug therapy: Secondary | ICD-10-CM | POA: Diagnosis not present

## 2019-09-15 DIAGNOSIS — Z85028 Personal history of other malignant neoplasm of stomach: Secondary | ICD-10-CM | POA: Diagnosis not present

## 2019-09-15 DIAGNOSIS — Z885 Allergy status to narcotic agent status: Secondary | ICD-10-CM | POA: Diagnosis not present

## 2019-09-15 DIAGNOSIS — I10 Essential (primary) hypertension: Secondary | ICD-10-CM | POA: Diagnosis not present

## 2019-09-15 DIAGNOSIS — Z9049 Acquired absence of other specified parts of digestive tract: Secondary | ICD-10-CM | POA: Diagnosis not present

## 2019-09-15 DIAGNOSIS — Z8572 Personal history of non-Hodgkin lymphomas: Secondary | ICD-10-CM | POA: Diagnosis not present

## 2019-09-15 DIAGNOSIS — R0602 Shortness of breath: Secondary | ICD-10-CM | POA: Diagnosis not present

## 2019-09-15 DIAGNOSIS — Z20822 Contact with and (suspected) exposure to covid-19: Secondary | ICD-10-CM | POA: Diagnosis not present

## 2019-09-15 DIAGNOSIS — R531 Weakness: Secondary | ICD-10-CM | POA: Diagnosis not present

## 2019-09-29 DIAGNOSIS — J449 Chronic obstructive pulmonary disease, unspecified: Secondary | ICD-10-CM | POA: Diagnosis not present

## 2019-10-12 ENCOUNTER — Encounter (INDEPENDENT_AMBULATORY_CARE_PROVIDER_SITE_OTHER): Payer: Medicare Other | Admitting: Ophthalmology

## 2019-10-12 ENCOUNTER — Other Ambulatory Visit: Payer: Self-pay

## 2019-10-12 DIAGNOSIS — H353211 Exudative age-related macular degeneration, right eye, with active choroidal neovascularization: Secondary | ICD-10-CM | POA: Diagnosis not present

## 2019-10-12 DIAGNOSIS — H43813 Vitreous degeneration, bilateral: Secondary | ICD-10-CM

## 2019-10-12 DIAGNOSIS — H353122 Nonexudative age-related macular degeneration, left eye, intermediate dry stage: Secondary | ICD-10-CM | POA: Diagnosis not present

## 2019-10-23 ENCOUNTER — Other Ambulatory Visit: Payer: Self-pay | Admitting: Nurse Practitioner

## 2019-10-23 DIAGNOSIS — J438 Other emphysema: Secondary | ICD-10-CM

## 2019-10-29 DIAGNOSIS — H40051 Ocular hypertension, right eye: Secondary | ICD-10-CM | POA: Diagnosis not present

## 2019-10-29 DIAGNOSIS — J449 Chronic obstructive pulmonary disease, unspecified: Secondary | ICD-10-CM | POA: Diagnosis not present

## 2019-10-29 DIAGNOSIS — H40021 Open angle with borderline findings, high risk, right eye: Secondary | ICD-10-CM | POA: Diagnosis not present

## 2019-11-09 ENCOUNTER — Other Ambulatory Visit: Payer: Self-pay

## 2019-11-09 ENCOUNTER — Encounter (INDEPENDENT_AMBULATORY_CARE_PROVIDER_SITE_OTHER): Payer: Medicare Other | Admitting: Ophthalmology

## 2019-11-09 DIAGNOSIS — H353122 Nonexudative age-related macular degeneration, left eye, intermediate dry stage: Secondary | ICD-10-CM | POA: Diagnosis not present

## 2019-11-09 DIAGNOSIS — H43813 Vitreous degeneration, bilateral: Secondary | ICD-10-CM | POA: Diagnosis not present

## 2019-11-09 DIAGNOSIS — H353211 Exudative age-related macular degeneration, right eye, with active choroidal neovascularization: Secondary | ICD-10-CM

## 2019-11-23 ENCOUNTER — Other Ambulatory Visit: Payer: Self-pay | Admitting: Nurse Practitioner

## 2019-11-23 DIAGNOSIS — J438 Other emphysema: Secondary | ICD-10-CM

## 2019-11-23 DIAGNOSIS — R42 Dizziness and giddiness: Secondary | ICD-10-CM

## 2019-11-29 DIAGNOSIS — J449 Chronic obstructive pulmonary disease, unspecified: Secondary | ICD-10-CM | POA: Diagnosis not present

## 2019-12-14 ENCOUNTER — Other Ambulatory Visit: Payer: Self-pay

## 2019-12-14 ENCOUNTER — Encounter (INDEPENDENT_AMBULATORY_CARE_PROVIDER_SITE_OTHER): Payer: Medicare Other | Admitting: Ophthalmology

## 2019-12-14 DIAGNOSIS — H353211 Exudative age-related macular degeneration, right eye, with active choroidal neovascularization: Secondary | ICD-10-CM | POA: Diagnosis not present

## 2019-12-14 DIAGNOSIS — H353122 Nonexudative age-related macular degeneration, left eye, intermediate dry stage: Secondary | ICD-10-CM

## 2019-12-14 DIAGNOSIS — H43813 Vitreous degeneration, bilateral: Secondary | ICD-10-CM | POA: Diagnosis not present

## 2019-12-26 DIAGNOSIS — H40023 Open angle with borderline findings, high risk, bilateral: Secondary | ICD-10-CM | POA: Diagnosis not present

## 2019-12-26 DIAGNOSIS — H40051 Ocular hypertension, right eye: Secondary | ICD-10-CM | POA: Diagnosis not present

## 2019-12-27 ENCOUNTER — Other Ambulatory Visit: Payer: Self-pay | Admitting: Nurse Practitioner

## 2019-12-27 DIAGNOSIS — J438 Other emphysema: Secondary | ICD-10-CM

## 2019-12-29 DIAGNOSIS — J449 Chronic obstructive pulmonary disease, unspecified: Secondary | ICD-10-CM | POA: Diagnosis not present

## 2020-01-09 ENCOUNTER — Other Ambulatory Visit: Payer: Self-pay

## 2020-01-09 ENCOUNTER — Encounter: Payer: Self-pay | Admitting: Internal Medicine

## 2020-01-09 ENCOUNTER — Ambulatory Visit: Payer: Medicare Other | Admitting: Internal Medicine

## 2020-01-09 DIAGNOSIS — R05 Cough: Secondary | ICD-10-CM

## 2020-01-09 DIAGNOSIS — J449 Chronic obstructive pulmonary disease, unspecified: Secondary | ICD-10-CM | POA: Diagnosis not present

## 2020-01-09 DIAGNOSIS — R058 Other specified cough: Secondary | ICD-10-CM

## 2020-01-09 DIAGNOSIS — I10 Essential (primary) hypertension: Secondary | ICD-10-CM

## 2020-01-09 MED ORDER — TELMISARTAN 40 MG PO TABS: 40.0000 mg | ORAL_TABLET | Freq: Every day | ORAL | 11 refills | Status: DC

## 2020-01-09 NOTE — Patient Instructions (Addendum)
Change prilosec to Take 30-60 min before first meal of the day   Stop breo - ok to restart if you are losing ground with breathing   Only use your albuterol as a rescue medication to be used if you can't catch your breath by resting or doing a relaxed purse lip breathing pattern.  - The less you use it, the better it will work when you need it. - Ok to use up to 2 puffs  every 4 hours if you must but call for immediate appointment if use goes up over your usual need - Don't leave home without it !!  (think of it like the spare tire for your car)   Make sure you check your oxygen saturations at highest level of activity to be sure it stays over 90%  And if it does you do not need oxygen  Stop lisinopril and start micardis (telmarsartan) 40 mg  One daily-  ok to break in half if too strong  Please schedule a follow up office visit in 6 weeks with PFTs, call sooner if needed

## 2020-01-09 NOTE — Progress Notes (Signed)
Patrick Novak, male    DOB: [birth year],    MRN: [id number]   Brief patient profile:  82 yowm quit smoking 2001 due to gastric ca s/p chemo then bad spell of breathing > [name] dx emphysema and ever since then prn saba then [facility] added by Dr Luan Pulling but never back to baseline  main c/o has been variable cough > sob and self referred back to [place] pulmonary clinic    History of Present Illness  01/09/2020  Pulmonary/ 1st office eval/[place]/ Patrick Novak on ACEi and Breo  Chief Complaint  Patient presents with  . Consult    Patient has COPD and has shortness of breath with exertion. Denies cough. Uses [facility] is going to ask about switching  Dyspnea:  MMRC1 = can walk nl pace, flat grade, can't hurry or go uphills or steps s sob   Cough: variable cough when something stuck  Sleep: ok flat  SABA use: rarely  02 prn   No obvious day to day or daytime variability or assoc excess/ purulent sputum or mucus plugs or hemoptysis or cp or chest tightness, subjective wheeze or overt sinus or hb symptoms.   Sleeping as above  without nocturnal  or early am exacerbation  of respiratory  c/o's or need for noct saba. Also denies any obvious fluctuation of symptoms with weather or environmental changes or other aggravating or alleviating factors except as outlined above   No unusual exposure hx or h/o childhood pna/ asthma or knowledge of premature birth.  Current Allergies, Complete Past Medical History, Past Surgical History, Family History, and Social History were reviewed in [employer] record.  ROS  The following are not active complaints unless bolded Hoarseness, sore throat, dysphagia, dental problems, itching, sneezing,  nasal congestion or discharge of excess mucus or purulent secretions, ear ache,   fever, chills, sweats, unintended wt loss or wt gain, classically pleuritic or exertional cp,  orthopnea pnd or arm/hand swelling  or leg swelling, presyncope, palpitations,  abdominal pain, anorexia, nausea, vomiting, diarrhea  or change in bowel habits or change in bladder habits, change in stools or change in urine, dysuria, hematuria,  rash, arthralgias, visual complaints, headache, numbness, weakness or ataxia or problems with walking or coordination,  change in mood or  memory.           Past Medical History:  Diagnosis Date  . Bradycardia    asympotmatic. Holter (2/11) with PACs, 1 run of 5 beats NSVT, HR range 41-125 with no pauses.   . 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 COPD (chronic obstructive pulmonary disease) ([place])   . ED (erectile dysfunction)   . Gastric ulcer   . GERD (gastroesophageal reflux disease)   . Hiatal hernia   . History of stomach cancer    surgery in 2001  . HTN (hypertension)   . Low testosterone   . MR (mitral regurgitation)    echo 2/11: EF 60%, no regional wall abnormalities, mild MR, mild to moderate RV dialation, mild to moderate RV dysfunction   . Osteopenia   . Vertigo   . Vertigo   . Vitamin D deficiency     Outpatient Medications Prior to Visit  Medication Sig Dispense Refill  . BREO ELLIPTA 200-25 MCG/INH AEPB Inhale 1 puff by mouth once daily 60 each 0  . Calcium Carbonate-Vitamin D 600-400 MG-UNIT per tablet Take 1 tablet by mouth daily.     [name] lisinopril (ZESTRIL) 5 MG tablet Take 1  tablet (5 mg total) by mouth daily. 90 tablet 1  . meclizine (ANTIVERT) 12.5 MG tablet Take 1 tablet (12.5 mg total) by mouth 2 (two) times daily as needed. 180 tablet 0  . meloxicam (MOBIC) 15 MG tablet Take 1 tablet (15 mg total) by mouth daily. (Patient taking differently: Take 15 mg by mouth daily as needed. ) 30 tablet 2  . Multiple Vitamin (MULTIVITAMIN) tablet Take 1 tablet by mouth daily.      . Multiple Vitamins-Minerals (PRESERVISION AREDS PO) Take by mouth.    [name] omeprazole (PRILOSEC) 20 MG capsule Take 1 capsule (20 mg total) by mouth daily. (Needs to be seen before next refill) 90 capsule 1  . ondansetron (ZOFRAN) 4 MG  tablet TAKE 1/2 TO 1 (ONE-HALF TO ONE) TABLET BY MOUTH TWICE DAILY AS NEEDED 12 tablet 0  . Testosterone 20.25 MG/ACT (1.62%) GEL Apply 4 Pump topically daily. Apply to shoulder area Daily 75 g 5   No facility-administered medications prior to visit.     Objective:     BP (!) 160/80 (BP Location: Left Arm, Patient Position: Sitting, Cuff Size: Normal)   Pulse 63   Temp (!) 96.7 F (35.9 C) (Oral)   Ht 5\' 10"  (1.778 m)   Wt 167 lb (75.8 kg)   SpO2 95% Comment: on room air  BMI 23.96 kg/m   SpO2: 95 % (on room air)    HEENT : pt wearing mask not removed for exam due to covid -19 concerns.    NECK :  without JVD/Nodes/TM/ nl carotid upstrokes bilaterally   LUNGS: no acc muscle use,  Nl contour chest which is clear to A and P bilaterally without cough on insp or exp maneuvers   CV:  RRR  no s3 or murmur or increase in P2, and no edema   ABD:  soft and nontender with nl inspiratory excursion in the supine position. No bruits or organomegaly appreciated, bowel sounds nl  MS:  Nl gait/ ext warm without deformities, calf tenderness, cyanosis or clubbing No obvious joint restrictions   SKIN: warm and dry without lesions    NEURO:  alert, approp, nl sensorium with  no motor or cerebellar deficits apparent.          Spring 2021 cxr ok per pt not available in epic     Assessment   COPD GOLD ?  Stopped smoking 2001 s resp symptoms, later told had emphysema - try off breo and ACEi as of 01/09/2020 > just use saba short term    When respiratory symptoms begin or become refractory well after a patient reports complete smoking cessation,  Especially when this wasn't the case while they were smoking, a red flag is raised based on the work of Dr Kris Mouton which states:  if you quit smoking when your best day FEV1 is still well preserved it is highly unlikely you will progress to severe disease. That is to say, once the smoking stops,  the symptoms should not suddenly erupt or  markedly worsen.  If so, the differential diagnosis should include  obesity/deconditioning,  LPR/Reflux/Aspiration syndromes,  occult CHF, or  especially side effect of medications commonly used in this population, especially acei and DPI's which can synergistically irritate the upper airway and mimic copd/ab  >>> only way to know is stop both and add back breo if ends up needing more saba and f/u with full pfts off breo that day in about 6 weeks.     Essential hypertension  Try off acei 01/09/2020 due to cough   In the best review of chronic cough to date ( NEJM 2016 375 0539-7673) ,  ACEi are now felt to cause cough in up to  20% of pts which is a 4 fold increase from previous reports and does not include the variety of non-specific complaints we see in pulmonary clinic in pts on ACEi but previously attributed to another dx like  Copd/asthma (as may be the case here)  and  include PNDS, throat fullness/globus ("like something stuck in throat" as is the case here)  and chest congestion, "bronchitis", unexplained dyspnea and noct "strangling" sensations, and hoarseness, but also  atypical /refractory GERD symptoms like dysphagia and "bad heartburn"   The only way I know  to prove this is not an "ACEi Case" is a trial off ACEi x a minimum of 6 weeks then regroup.    >>> try micardis 40 mg daily       Upper airway cough syndrome Try off acei and on ppi 01/09/2020   Upper airway cough syndrome (previously labeled PNDS),  is so named because it's frequently impossible to sort out how much is  CR/sinusitis with freq throat clearing (which can be related to primary GERD)   vs  causing  secondary (" extra esophageal")  GERD from wide swings in gastric pressure that occur with throat clearing, often  promoting self use of mint and menthol lozenges that reduce the lower esophageal sphincter tone and exacerbate the problem further in a cyclical fashion.   These are the same pts (now being labeled as  having "irritable larynx syndrome" by some cough centers) who not infrequently have a history of having failed to tolerate ace inhibitors,  dry powder inhalers(both in use here) or biphosphonates or report having atypical/extraesophageal reflux symptoms that don't respond to standard doses of PPI  and are easily confused as having aecopd or asthma flares by even experienced allergists/ pulmonologists (myself included).     >>> f/u 6 weeks p trial on ppi otc and off ACEi   Discussed in detail all the  indications, usual  risks and alternatives  relative to the benefits with patient who agrees to proceed with Rx as outlined.      Medical decision making was a moderate level of complexity in this case because of  two chronic conditions /diagnoses requiring extra time for  H and P, chart review, counseling,    and generating customized AVS unique to this office visit and charting.   Each maintenance medication was reviewed in detail including emphasizing most importantly the difference between maintenance and prns and under what circumstances the prns are to be triggered using an action plan format where appropriate. Please see avs for details which were reviewed in writing by both me and my nurse and patient given a written copy highlighted where appropriate with yellow highlighter for the patient's continued care at home along with an updated version of their medications.  Patient was asked to maintain medication reconciliation by comparing this list to the actual medications being used at home and to contact this office right away if there is a conflict or discrepancy.          Christinia Gully, MD 01/09/2020

## 2020-01-10 ENCOUNTER — Encounter: Payer: Self-pay | Admitting: Internal Medicine

## 2020-01-10 ENCOUNTER — Telehealth: Payer: Self-pay | Admitting: Internal Medicine

## 2020-01-10 DIAGNOSIS — R058 Other specified cough: Secondary | ICD-10-CM | POA: Insufficient documentation

## 2020-01-10 NOTE — Telephone Encounter (Signed)
Patient aware micardis was sent in yesterday nothing else needed.

## 2020-01-10 NOTE — Assessment & Plan Note (Signed)
Try off acei 01/09/2020 due to cough   In the best review of chronic cough to date ( NEJM 2016 375 7342-8768) ,  ACEi are now felt to cause cough in up to  20% of pts which is a 4 fold increase from previous reports and does not include the variety of non-specific complaints we see in pulmonary clinic in pts on ACEi but previously attributed to another dx like  Copd/asthma (as may be the case here)  and  include PNDS, throat fullness/globus ("like something stuck in throat" as is the case here)  and chest congestion, "bronchitis", unexplained dyspnea and noct "strangling" sensations, and hoarseness, but also  atypical /refractory GERD symptoms like dysphagia and "bad heartburn"   The only way I know  to prove this is not an "ACEi Case" is a trial off ACEi x a minimum of 6 weeks then regroup.    >>> try micardis 40 mg daily

## 2020-01-10 NOTE — Assessment & Plan Note (Addendum)
Stopped smoking 2001 s resp symptoms, later told had emphysema - try off breo and ACEi as of 01/09/2020 > just use saba short term    When respiratory symptoms begin or become refractory well after a patient reports complete smoking cessation,  Especially when this wasn't the case while they were smoking, a red flag is raised based on the work of Dr Kris Mouton which states:  if you quit smoking when your best day FEV1 is still well preserved it is highly unlikely you will progress to severe disease. That is to say, once the smoking stops,  the symptoms should not suddenly erupt or markedly worsen.  If so, the differential diagnosis should include  obesity/deconditioning,  LPR/Reflux/Aspiration syndromes,  occult CHF, or  especially side effect of medications commonly used in this population, especially acei and DPI's which can synergistically irritate the upper airway and mimic copd/ab  >>> only way to know is stop both and add back breo if ends up needing more saba and f/u with full pfts off breo that day in about 6 weeks.

## 2020-01-10 NOTE — Assessment & Plan Note (Signed)
Try off acei and on ppi 01/09/2020   Upper airway cough syndrome (previously labeled PNDS),  is so named because it's frequently impossible to sort out how much is  CR/sinusitis with freq throat clearing (which can be related to primary GERD)   vs  causing  secondary (" extra esophageal")  GERD from wide swings in gastric pressure that occur with throat clearing, often  promoting self use of mint and menthol lozenges that reduce the lower esophageal sphincter tone and exacerbate the problem further in a cyclical fashion.   These are the same pts (now being labeled as having "irritable larynx syndrome" by some cough centers) who not infrequently have a history of having failed to tolerate ace inhibitors,  dry powder inhalers(both in use here) or biphosphonates or report having atypical/extraesophageal reflux symptoms that don't respond to standard doses of PPI  and are easily confused as having aecopd or asthma flares by even experienced allergists/ pulmonologists (myself included).     >>> f/u 6 weeks p trial on ppi otc and off ACEi   Discussed in detail all the  indications, usual  risks and alternatives  relative to the benefits with patient who agrees to proceed with Rx as outlined.      Medical decision making was a moderate level of complexity in this case because of  two chronic conditions /diagnoses requiring extra time for  H and P, chart review, counseling,    and generating customized AVS unique to this office visit and charting.   Each maintenance medication was reviewed in detail including emphasizing most importantly the difference between maintenance and prns and under what circumstances the prns are to be triggered using an action plan format where appropriate. Please see avs for details which were reviewed in writing by both me and my nurse and patient given a written copy highlighted where appropriate with yellow highlighter for the patient's continued care at home along with an updated  version of their medications.  Patient was asked to maintain medication reconciliation by comparing this list to the actual medications being used at home and to contact this office right away if there is a conflict or discrepancy.

## 2020-01-18 ENCOUNTER — Other Ambulatory Visit: Payer: Self-pay

## 2020-01-18 ENCOUNTER — Encounter (INDEPENDENT_AMBULATORY_CARE_PROVIDER_SITE_OTHER): Payer: Medicare Other | Admitting: Ophthalmology

## 2020-01-18 DIAGNOSIS — H353211 Exudative age-related macular degeneration, right eye, with active choroidal neovascularization: Secondary | ICD-10-CM

## 2020-01-18 DIAGNOSIS — H353122 Nonexudative age-related macular degeneration, left eye, intermediate dry stage: Secondary | ICD-10-CM

## 2020-01-18 DIAGNOSIS — H43813 Vitreous degeneration, bilateral: Secondary | ICD-10-CM | POA: Diagnosis not present

## 2020-01-24 ENCOUNTER — Telehealth: Payer: Self-pay | Admitting: Internal Medicine

## 2020-01-24 MED ORDER — TELMISARTAN 40 MG PO TABS: 40.0000 mg | ORAL_TABLET | Freq: Every day | ORAL | 11 refills | Status: DC

## 2020-01-24 NOTE — Telephone Encounter (Signed)
Spoke with pt who stated he needed Micardis called in. Pt also asked about PFT and told he would be receiving a call to schedule this test. Pt states understanding. Med was called in to [facility] in [place]. Nothing further needed at this time.

## 2020-01-28 ENCOUNTER — Other Ambulatory Visit: Payer: Self-pay | Admitting: Nurse Practitioner

## 2020-01-28 DIAGNOSIS — R42 Dizziness and giddiness: Secondary | ICD-10-CM

## 2020-02-01 ENCOUNTER — Other Ambulatory Visit: Payer: Self-pay | Admitting: Nurse Practitioner

## 2020-02-15 ENCOUNTER — Encounter (INDEPENDENT_AMBULATORY_CARE_PROVIDER_SITE_OTHER): Payer: Medicare Other | Admitting: Ophthalmology

## 2020-02-15 ENCOUNTER — Other Ambulatory Visit: Payer: Self-pay

## 2020-02-15 DIAGNOSIS — H353211 Exudative age-related macular degeneration, right eye, with active choroidal neovascularization: Secondary | ICD-10-CM

## 2020-02-15 DIAGNOSIS — H353122 Nonexudative age-related macular degeneration, left eye, intermediate dry stage: Secondary | ICD-10-CM | POA: Diagnosis not present

## 2020-02-15 DIAGNOSIS — H43813 Vitreous degeneration, bilateral: Secondary | ICD-10-CM | POA: Diagnosis not present

## 2020-02-17 ENCOUNTER — Other Ambulatory Visit: Payer: Self-pay | Admitting: Nurse Practitioner

## 2020-02-27 ENCOUNTER — Other Ambulatory Visit: Payer: Self-pay

## 2020-02-27 ENCOUNTER — Ambulatory Visit: Payer: Medicare Other | Admitting: Internal Medicine

## 2020-02-27 ENCOUNTER — Encounter: Payer: Self-pay | Admitting: Internal Medicine

## 2020-02-27 ENCOUNTER — Other Ambulatory Visit (HOSPITAL_COMMUNITY)
Admission: RE | Admit: 2020-02-27 | Discharge: 2020-02-27 | Disposition: A | Discharge status: Home / Self Care | Payer: Medicare Other | Source: Ambulatory Visit | Attending: Internal Medicine | Admitting: Internal Medicine

## 2020-02-27 ENCOUNTER — Ambulatory Visit (HOSPITAL_COMMUNITY)
Admission: RE | Admit: 2020-02-27 | Discharge: 2020-02-27 | Disposition: A | Discharge status: Home / Self Care | Payer: Medicare Other | Source: Ambulatory Visit | Attending: Internal Medicine | Admitting: Internal Medicine

## 2020-02-27 DIAGNOSIS — J449 Chronic obstructive pulmonary disease, unspecified: Secondary | ICD-10-CM | POA: Insufficient documentation

## 2020-02-27 DIAGNOSIS — I10 Essential (primary) hypertension: Secondary | ICD-10-CM | POA: Diagnosis not present

## 2020-02-27 DIAGNOSIS — K219 Gastro-esophageal reflux disease without esophagitis: Secondary | ICD-10-CM | POA: Diagnosis not present

## 2020-02-27 DIAGNOSIS — R05 Cough: Secondary | ICD-10-CM | POA: Diagnosis not present

## 2020-02-27 DIAGNOSIS — J9611 Chronic respiratory failure with hypoxia: Secondary | ICD-10-CM | POA: Insufficient documentation

## 2020-02-27 DIAGNOSIS — R058 Other specified cough: Secondary | ICD-10-CM

## 2020-02-27 LAB — CBC WITH DIFFERENTIAL/PLATELET
Abs Immature Granulocytes: 0.02 10*3/uL (ref 0.00–0.07)
Basophils Absolute: 0.1 10*3/uL (ref 0.0–0.1)
Basophils Relative: 1 %
Eosinophils Absolute: 0.1 10*3/uL (ref 0.0–0.5)
Eosinophils Relative: 1 %
HCT: 50.3 % (ref 39.0–52.0)
Hemoglobin: 16.6 g/dL (ref 13.0–17.0)
Immature Granulocytes: 0 %
Lymphocytes Relative: 29 %
Lymphs Abs: 2.3 10*3/uL (ref 0.7–4.0)
MCH: 32.2 pg (ref 26.0–34.0)
MCHC: 33 g/dL (ref 30.0–36.0)
MCV: 97.5 fL (ref 80.0–100.0)
Monocytes Absolute: 0.6 10*3/uL (ref 0.1–1.0)
Monocytes Relative: 7 %
Neutro Abs: 4.8 10*3/uL (ref 1.7–7.7)
Neutrophils Relative %: 62 %
Platelets: 291 10*3/uL (ref 150–400)
RBC: 5.16 MIL/uL (ref 4.22–5.81)
RDW: 12.3 % (ref 11.5–15.5)
WBC: 7.8 10*3/uL (ref 4.0–10.5)
nRBC: 0 % (ref 0.0–0.2)

## 2020-02-27 LAB — BASIC METABOLIC PANEL
Anion gap: 8 (ref 5–15)
BUN: 19 mg/dL (ref 8–23)
CO2: 29 mmol/L (ref 22–32)
Calcium: 9.1 mg/dL (ref 8.9–10.3)
Chloride: 102 mmol/L (ref 98–111)
Creatinine, Ser: 1.09 mg/dL (ref 0.61–1.24)
GFR calc Af Amer: >60 mL/min (ref >60)
GFR calc non Af Amer: >60 mL/min (ref >60)
Glucose, Bld: 91 mg/dL (ref 70–99)
Potassium: 4.1 mmol/L (ref 3.5–5.1)
Sodium: 139 mmol/L (ref 135–145)

## 2020-02-27 MED ORDER — OMEPRAZOLE 20 MG PO CPDR: DELAYED_RELEASE_CAPSULE | ORAL | 1 refills | Status: DC

## 2020-02-27 NOTE — Assessment & Plan Note (Addendum)
02/27/2020   Walked RA  approx   200 ft  @ avg pace  stopped due to desats to 88% at end of study though denies doe   rec no need for 02 at rest, needs to monitor sats with ex to assure > 90% and either walk at slower pace or wear 02 at this point and return for full pfts as planned           Each maintenance medication was reviewed in detail including emphasizing most importantly the difference between maintenance and prns and under what circumstances the prns are to be triggered using an action plan format where appropriate.  Total time for H and P, chart review, counseling,  directly observing portions of ambulatory 02 saturation study/ and generating customized AVS unique to this office visit / charting = 33 min

## 2020-02-27 NOTE — Patient Instructions (Addendum)
Try albuterol 15 min before an activity that you know would make you short of breath and see if it makes any difference and if makes none then don't take it after activity unless you can't catch your breath.  Also use your albuterol as a rescue medication to be used if you can't catch your breath by resting or doing a relaxed purse lip breathing pattern.  - The less you use it, the better it will work when you need it. - Ok to use up to 2 puffs  every 4 hours if you must but call for immediate appointment if use goes up over your usual need - Don't leave home without it !!  (think of it like the spare tire for your car)       Prilosec (omeprazole)  20 mg Take 30- 60 min before your first and last meals of the day   GERD (REFLUX)  is an extremely common cause of respiratory symptoms just like yours , many times with no obvious heartburn at all.    It can be treated with medication, but also with lifestyle changes including elevation of the head of your bed (ideally with 6 -8inch blocks under the headboard of your bed),  Smoking cessation, avoidance of late meals, excessive alcohol, and avoid fatty foods, chocolate, peppermint, colas, red wine, and acidic juices such as orange juice.  NO MINT OR MENTHOL PRODUCTS SO NO COUGH DROPS  USE SUGARLESS CANDY INSTEAD (Jolley ranchers or Stover's or Life Savers) or even ice chips will also do - the key is to swallow to prevent all throat clearing. NO OIL BASED VITAMINS - use powdered substitutes.  Avoid fish oil when coughing.  Please remember to go to the lab and x-ray department at [facility]   for your tests - we will call you with the results when they are available.       Please schedule a follow up office visit in 6 weeks - PFTs on return

## 2020-02-27 NOTE — Assessment & Plan Note (Signed)
Try off acei 01/09/2020 due to cough   Although even in retrospect it may not be clear the ACEi contributed to the pt's symptoms,  Pt improved off them and adding them back at this point or in the future would risk confusion in interpretation of non-specific respiratory symptoms to which this patient is prone  ie  Better not to muddy the waters here.    >>> continue micardis 40 mg daily

## 2020-02-27 NOTE — Assessment & Plan Note (Signed)
Stopped smoking 2001 s resp symptoms, later told had emphysema - try off breo and ACEi as of 01/09/2020 > just use saba short term   No worse off breo in terms of doe or saba need  Reminded: I spent extra time with pt today reviewing appropriate use of albuterol for prn use on exertion with the following points: 1) saba is for relief of sob that does not improve by walking a slower pace or resting but rather if the pt does not improve after trying this first. 2) If the pt is convinced, as many are, that saba helps recover from activity faster then it's easy to tell if this is the case by re-challenging : ie stop, take the inhaler, then p 5 minutes try the exact same activity (intensity of workload) that just caused the symptoms and see if they are substantially diminished or not after saba 3) if there is an activity that reproducibly causes the symptoms, try the saba 15 min before the activity on alternate days   If in fact the saba really does help, then fine to continue to use it prn but advised may need to look closer at the maintenance regimen being used to achieve better control of airways disease with exertion.

## 2020-02-27 NOTE — Assessment & Plan Note (Signed)
Try off acei and on ppi 01/09/2020 > only stopped acei as of 02/27/2020 > rec bid ppi x 6 week trial plus diet   Upper airway cough syndrome (previously labeled PNDS),  is so named because it's frequently impossible to sort out how much is  CR/sinusitis with freq throat clearing (which can be related to primary GERD)   vs  causing  secondary (" extra esophageal")  GERD from wide swings in gastric pressure that occur with throat clearing, often  promoting self use of mint and menthol lozenges that reduce the lower esophageal sphincter tone and exacerbate the problem further in a cyclical fashion.   These are the same pts (now being labeled as having "irritable larynx syndrome" by some cough centers) who not infrequently have a history of having failed to tolerate ace inhibitors,  dry powder inhalers or biphosphonates or report having atypical/extraesophageal reflux symptoms that don't respond to standard doses of PPI  and are easily confused as having aecopd or asthma flares by even experienced allergists/ pulmonologists (myself included).   Now that off acei and dpi and not much better rec trial of ppibid and NO JUNIOR MINTS!

## 2020-02-27 NOTE — Progress Notes (Signed)
Patrick Novak, male    DOB: 09-02-1940,    MRN: [id number]   Brief patient profile:  19 yowm quit smoking 2001 due to gastric ca s/p chemo then bad spell of breathing > [name] admitted with pna dx emphysema and ever since then prn saba then [facility] added by Dr Luan Pulling but never back to baseline  main c/o has been variable cough > sob and self referred back to [place] pulmonary clinic    History of Present Illness  01/09/2020  Pulmonary/ 1st office eval/[place]/ Brycin Kille on ACEi and Breo  Chief Complaint  Patient presents with  . Consult    Patient has COPD and has shortness of breath with exertion. Denies cough. Uses [employer] is going to ask about switching  Dyspnea:  MMRC1 = can walk nl pace, flat grade, can't hurry or go uphills or steps s sob  Cough: variable cough when something stuck  Sleep: ok flat  SABA use: rarely  02 prn  rec Change prilosec to Take 30-60 min before first meal of the day > did not do  Stop breo - ok to restart if you are losing ground with breathing >  Did do Only use your albuterol as a rescue medication > about the same use  Make sure you check your oxygen saturations at highest level of activity to be sure it stays over 90%  And if it does you do not need oxygen> did not do  Stop lisinopril and start micardis (telmarsartan) 40 mg  One daily-  ok to break in half if too strong Please schedule a follow up office visit in 6 weeks with PFTs, call sooner if needed    02/27/2020  f/u ov/Fujiko Picazo re: ? Copd/ no worse off breo / cough no change off acei  Chief Complaint  Patient presents with  . Follow-up    shortness of breath with exertion, productive cough with white phlegm  Dyspnea:  No change = MMRC1 = can walk nl pace, flat grade, can't hurry or go uphills or steps s sob   Cough: more cough in am congested sounding but mucoid  Sleeping: flat on bed / one pillow  SABA use: rarely now  02: not using   No obvious day to day or daytime variability or assoc  purulent  sputum or mucus plugs or hemoptysis or cp or chest tightness, subjective wheeze or overt sinus or hb symptoms.   Sleeping  without nocturnal   exacerbation  of respiratory  c/o's or need for noct saba. Also denies any obvious fluctuation of symptoms with weather or environmental changes or other aggravating or alleviating factors except as outlined above   No unusual exposure hx or h/o childhood pna/ asthma or knowledge of premature birth.  Current Allergies, Complete Past Medical History, Past Surgical History, Family History, and Social History were reviewed in [employer] record.  ROS  The following are not active complaints unless bolded Hoarseness, sore throat, dysphagia, dental problems, itching, sneezing,  nasal congestion or discharge of excess mucus or purulent secretions, ear ache,   fever, chills, sweats, unintended wt loss or wt gain, classically pleuritic or exertional cp,  orthopnea pnd or arm/hand swelling  or leg swelling, presyncope, palpitations, abdominal pain, anorexia, nausea, vomiting, diarrhea  or change in bowel habits or change in bladder habits, change in stools or change in urine, dysuria, hematuria,  rash, arthralgias, visual complaints, headache, numbness, weakness or ataxia or problems with walking or coordination,  change in  mood or  memory.        Current Meds  Medication Sig  . Calcium Carbonate-Vitamin D 600-400 MG-UNIT per tablet Take 1 tablet by mouth daily.   . meclizine (ANTIVERT) 12.5 MG tablet Take 1 tablet (12.5 mg total) by mouth 2 (two) times daily as needed.  . meloxicam (MOBIC) 15 MG tablet Take 1 tablet (15 mg total) by mouth daily. (Patient taking differently: Take 15 mg by mouth daily as needed. )  . Multiple Vitamin (MULTIVITAMIN) tablet Take 1 tablet by mouth daily.    . Multiple Vitamins-Minerals (PRESERVISION AREDS PO) Take by mouth.  [name] omeprazole (PRILOSEC) 20 MG capsule Not taking   . ondansetron (ZOFRAN) 4 MG tablet  TAKE 1/2 TO 1 (ONE-HALF TO ONE) TABLET BY MOUTH TWICE DAILY AS NEEDED  . telmisartan (MICARDIS) 40 MG tablet Take 1 tablet (40 mg total) by mouth daily.  . Testosterone 20.25 MG/ACT (1.62%) GEL Apply 4 Pump topically daily. Apply to shoulder area Daily               Past Medical History:  Diagnosis Date  . Bradycardia    asympotmatic. Holter (2/11) with PACs, 1 run of 5 beats NSVT, HR range 41-125 with no pauses.   . 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 COPD (chronic obstructive pulmonary disease) ([place])   . ED (erectile dysfunction)   . Gastric ulcer   . GERD (gastroesophageal reflux disease)   . Hiatal hernia   . History of stomach cancer    surgery in 2001  . HTN (hypertension)   . Low testosterone   . MR (mitral regurgitation)    echo 2/11: EF 60%, no regional wall abnormalities, mild MR, mild to moderate RV dialation, mild to moderate RV dysfunction   . Osteopenia   . Vertigo   . Vertigo   . Vitamin D deficiency       Objective:     Wt Readings from Last 3 Encounters:  02/27/20 167 lb 12.8 oz (76.1 kg)  01/09/20 167 lb (75.8 kg)  09/13/19 168 lb (76.2 kg)     Vital signs reviewed - Note on arrival 02/27/2020  02 sats  95% on RA and BP  142/78     HEENT : pt wearing mask not removed for exam due to covid - 19 concerns.   NECK :  without JVD/Nodes/TM/ nl carotid upstrokes bilaterally   LUNGS: no acc muscle use,  Min barrel  contour chest wall with bilateral decreased bs s audible wheeze and  without cough on insp or exp maneuvers and min  Hyperresonant  to  percussion bilaterally     CV:  RRR  no s3 or murmur or increase in P2, and no edema   ABD:  soft and nontender with pos end  insp Hoover's  in the supine position. No bruits or organomegaly appreciated, bowel sounds nl  MS:   Nl gait/  ext warm without deformities, calf tenderness, cyanosis or clubbing No obvious joint restrictions   SKIN: warm and dry without lesions    NEURO:  alert, approp, nl  sensorium with  no motor or cerebellar deficits apparent.           CXR PA and Lateral:   02/27/2020 :    I personally reviewed images and agree with radiology impression as follows:    1. Extensive background emphysema, with no superimposed airspace disease.   Labs ordered 02/27/2020  :  allergy profile   alpha  one AT phenotype      Assessment

## 2020-02-28 LAB — ALPHA-1 ANTITRYPSIN PHENOTYPE: A-1 Antitrypsin, Ser: 134 mg/dL (ref 101–187)

## 2020-02-29 LAB — IGE: IgE (Immunoglobulin E), Serum: 72 IU/mL (ref 6–495)

## 2020-02-29 NOTE — Progress Notes (Signed)
Called and left message on voicemail to please return call. Contact number left.

## 2020-02-29 NOTE — Progress Notes (Signed)
Patient returned phone call. Writer went over xray result per Dr Melvyn Novas. All questions answered and patient expressed full understanding. Nothing further needed at this time.

## 2020-03-17 ENCOUNTER — Other Ambulatory Visit: Payer: Self-pay

## 2020-03-17 ENCOUNTER — Other Ambulatory Visit (HOSPITAL_COMMUNITY)
Admission: RE | Admit: 2020-03-17 | Discharge: 2020-03-17 | Disposition: A | Discharge status: Home / Self Care | Payer: Medicare Other | Source: Ambulatory Visit | Attending: Internal Medicine | Admitting: Internal Medicine

## 2020-03-17 DIAGNOSIS — Z01812 Encounter for preprocedural laboratory examination: Secondary | ICD-10-CM | POA: Diagnosis present

## 2020-03-17 DIAGNOSIS — Z20822 Contact with and (suspected) exposure to covid-19: Secondary | ICD-10-CM | POA: Diagnosis not present

## 2020-03-17 LAB — SARS CORONAVIRUS 2 (TAT 6-24 HRS): SARS Coronavirus 2: NEGATIVE

## 2020-03-18 NOTE — Progress Notes (Signed)
Called and spoke with patient's wife Donnivan Villena) per [facility] about lab result per Dr Melvyn Novas. [name] expressed full understanding. Nothing further needed at this time.

## 2020-03-20 ENCOUNTER — Other Ambulatory Visit: Payer: Self-pay

## 2020-03-20 ENCOUNTER — Ambulatory Visit (HOSPITAL_COMMUNITY)
Admission: RE | Admit: 2020-03-20 | Discharge: 2020-03-20 | Disposition: A | Discharge status: Home / Self Care | Payer: Medicare Other | Source: Ambulatory Visit | Attending: Internal Medicine | Admitting: Internal Medicine

## 2020-03-20 DIAGNOSIS — J449 Chronic obstructive pulmonary disease, unspecified: Secondary | ICD-10-CM | POA: Diagnosis not present

## 2020-03-20 LAB — PULMONARY FUNCTION TEST
DL/VA % pred: 43 %
DL/VA: 1.68 ml/min/mmHg/L
DLCO unc % pred: 34 %
DLCO unc: 8.72 ml/min/mmHg
FEF 25-75 Post: 0.71 L/sec
FEF 25-75 Pre: 0.53 L/sec
FEF2575-%Change-Post: 34 %
FEF2575-%Pred-Post: 34 %
FEF2575-%Pred-Pre: 25 %
FEV1-%Change-Post: 11 %
FEV1-%Pred-Post: 48 %
FEV1-%Pred-Pre: 43 %
FEV1-Post: 1.44 L
FEV1-Pre: 1.29 L
FEV1FVC-%Change-Post: -7 %
FEV1FVC-%Pred-Pre: 66 %
FEV6-%Change-Post: 8 %
FEV6-%Pred-Post: 73 %
FEV6-%Pred-Pre: 67 %
FEV6-Post: 2.89 L
FEV6-Pre: 2.65 L
FEV6FVC-%Change-Post: -9 %
FEV6FVC-%Pred-Post: 94 %
FEV6FVC-%Pred-Pre: 104 %
FVC-%Change-Post: 20 %
FVC-%Pred-Post: 78 %
FVC-%Pred-Pre: 64 %
FVC-Post: 3.28 L
FVC-Pre: 2.72 L
Post FEV1/FVC ratio: 44 %
Post FEV6/FVC ratio: 88 %
Pre FEV1/FVC ratio: 48 %
Pre FEV6/FVC Ratio: 97 %
RV % pred: 158 %
RV: 4.3 L
TLC % pred: 107 %
TLC: 7.77 L

## 2020-03-20 MED ORDER — ALBUTEROL SULFATE (2.5 MG/3ML) 0.083% IN NEBU
2.5000 mg | INHALATION_SOLUTION | Freq: Once | RESPIRATORY_TRACT | Status: AC
  Administered 2020-03-20: 2.5 mg via RESPIRATORY_TRACT

## 2020-03-21 ENCOUNTER — Encounter (INDEPENDENT_AMBULATORY_CARE_PROVIDER_SITE_OTHER): Payer: Medicare Other | Admitting: Ophthalmology

## 2020-03-21 ENCOUNTER — Other Ambulatory Visit: Payer: Self-pay

## 2020-03-21 DIAGNOSIS — H353211 Exudative age-related macular degeneration, right eye, with active choroidal neovascularization: Secondary | ICD-10-CM | POA: Diagnosis not present

## 2020-03-21 DIAGNOSIS — H43813 Vitreous degeneration, bilateral: Secondary | ICD-10-CM | POA: Diagnosis not present

## 2020-03-21 DIAGNOSIS — H353122 Nonexudative age-related macular degeneration, left eye, intermediate dry stage: Secondary | ICD-10-CM

## 2020-03-24 ENCOUNTER — Telehealth: Payer: Self-pay | Admitting: Internal Medicine

## 2020-03-24 MED ORDER — ANORO ELLIPTA 62.5-25 MCG/INH IN AEPB: 1.0000 | INHALATION_SPRAY | Freq: Every day | RESPIRATORY_TRACT | 11 refills | Status: DC

## 2020-03-24 NOTE — Telephone Encounter (Signed)
Tanda Rockers, MD sent to Rosana Berger, [place] Call patient : Patrick Novak is c/w modately severe copd, best choice would be anoro trial and can call this in or discuss this at f/u = one click each am    Spoke with the pt and notified of results/recs per Dr Melvyn Novas  Pt verbalized understanding  He wants to go ahead and try the Anoro- rx sent

## 2020-03-24 NOTE — Progress Notes (Signed)
LMTCB

## 2020-03-26 ENCOUNTER — Other Ambulatory Visit: Payer: Self-pay | Admitting: Nurse Practitioner

## 2020-03-26 DIAGNOSIS — R7989 Other specified abnormal findings of blood chemistry: Secondary | ICD-10-CM

## 2020-03-27 NOTE — Progress Notes (Signed)
See phone note from 03/24/20.Marland Kitchenpt notified of results

## 2020-04-11 ENCOUNTER — Other Ambulatory Visit: Payer: Self-pay

## 2020-04-11 ENCOUNTER — Ambulatory Visit (INDEPENDENT_AMBULATORY_CARE_PROVIDER_SITE_OTHER): Payer: Medicare Other | Admitting: Internal Medicine

## 2020-04-11 ENCOUNTER — Encounter: Payer: Self-pay | Admitting: Internal Medicine

## 2020-04-11 DIAGNOSIS — J449 Chronic obstructive pulmonary disease, unspecified: Secondary | ICD-10-CM | POA: Diagnosis not present

## 2020-04-11 DIAGNOSIS — J9611 Chronic respiratory failure with hypoxia: Secondary | ICD-10-CM | POA: Diagnosis not present

## 2020-04-11 MED ORDER — ANORO ELLIPTA 62.5-25 MCG/INH IN AEPB: 1.0000 | INHALATION_SPRAY | Freq: Every day | RESPIRATORY_TRACT | 0 refills | Status: DC

## 2020-04-11 NOTE — Patient Instructions (Addendum)
Only use your albuterol as a rescue medication to be used if you can't catch your breath by resting or doing a relaxed purse lip breathing pattern.  - The less you use it, the better it will work when you need it. - Ok to use up to 2 puffs  every 4 hours if you must but call for immediate appointment if use goes up over your usual need - Don't leave home without it !!  (think of it like the spare tire for your car)   Try albuterol 15 min before an activity that you know would make you short of breath and see if it makes any difference and if makes none then don't take it after activity unless you can't catch your breath.    To get the most out of exercise, you need to be continuously aware that you are short of breath, but never out of breath, for 30 minutes daily but be sure you maintain over 90% or walk slower. As you improve, it will actually be easier for you to do the same amount of exercise  in  30 minutes so always push to the level where you are short of breath.      Make sure you check your oxygen saturations at highest level of activity to be sure it stays over 90% and adjust  02 flow upward to maintain this level if needed but remember to turn it back to previous settings when you stop (to conserve your supply).    Please schedule a follow up visit in 6 months but call sooner if needed

## 2020-04-11 NOTE — Progress Notes (Signed)
Patrick Novak, male    DOB: 12/09/1940,    MRN: [id number]   Brief patient profile:  76 yowm quit smoking 2001 due to gastric ca s/p chemo then bad spell of breathing > [name] admitted with pna dx emphysema and ever since then prn saba then [facility] added by Dr Luan Pulling but never back to baseline  main c/o has been variable cough > sob and self referred back to [place] pulmonary clinic    History of Present Illness  01/09/2020  Pulmonary/ 1st office eval/[place]/ Izreal Kock on ACEi and Breo  Chief Complaint  Patient presents with  . Consult    Patient has COPD and has shortness of breath with exertion. Denies cough. Uses [employer] is going to ask about switching  Dyspnea:  MMRC1 = can walk nl pace, flat grade, can't hurry or go uphills or steps s sob  Cough: variable cough when something stuck  Sleep: ok flat  SABA use: rarely  02 prn  rec Change prilosec to Take 30-60 min before first meal of the day > did not do  Stop breo - ok to restart if you are losing ground with breathing >  Did do Only use your albuterol as a rescue medication > about the same use  Make sure you check your oxygen saturations at highest level of activity to be sure it stays over 90%  And if it does you do not need oxygen> did not do  Stop lisinopril and start micardis (telmarsartan) 40 mg  One daily-  ok to break in half if too strong Please schedule a follow up office visit in 6 weeks with PFTs, call sooner if needed    02/27/2020  f/u ov/Miyana Mordecai re: ? Copd/ no worse off breo / cough no change off acei  Chief Complaint  Patient presents with  . Follow-up    shortness of breath with exertion, productive cough with white phlegm  Dyspnea:  No change = MMRC1 = can walk nl pace, flat grade, can't hurry or go uphills or steps s sob   Cough: more cough in am congested sounding but mucoid  Sleeping: flat on bed / one pillow  SABA use: rarely now  02: not using rec Try albuterol 15 min before an activity  Also use your  albuterol as a rescue medication   Prilosec (omeprazole)  20 mg Take 30- 60 min before your first and last meals of the day  GERD    04/11/2020  f/u ov/[place] office/Keairra Bardon re:  GOLD III, ex desats supposed to be on anoro not taking daily  Chief Complaint  Patient presents with  . Follow-up    shortness of breath with activity  Dyspnea:  Some better, playing 18 holes golf on Anoro/ but did not take on day of ov Cough: better to his satisfaction Sleeping: flat bed/ one pillow sleeps fine SABA use:no longer   02: has it not using but also not checking sats    No obvious day to day or daytime variability or assoc excess/ purulent sputum or mucus plugs or hemoptysis or cp or chest tightness, subjective wheeze or overt sinus or hb symptoms.   Sleeping as above without nocturnal  or early am exacerbation  of respiratory  c/o's or need for noct saba. Also denies any obvious fluctuation of symptoms with weather or environmental changes or other aggravating or alleviating factors except as outlined above   No unusual exposure hx or h/o childhood pna/ asthma or knowledge of premature  birth.  Current Allergies, Complete Past Medical History, Past Surgical History, Family History, and Social History were reviewed in [employer] record.  ROS  The following are not active complaints unless bolded Hoarseness, sore throat, dysphagia, dental problems, itching, sneezing,  nasal congestion or discharge of excess mucus or purulent secretions, ear ache,   fever, chills, sweats, unintended wt loss or wt gain, classically pleuritic or exertional cp,  orthopnea pnd or arm/hand swelling  or leg swelling, presyncope, palpitations, abdominal pain, anorexia, nausea, vomiting, diarrhea  or change in bowel habits or change in bladder habits, change in stools or change in urine, dysuria, hematuria,  rash, arthralgias, visual complaints, headache, numbness, weakness or ataxia or problems with  walking or coordination,  change in mood or  memory.        Current Meds  Medication Sig  . Calcium Carbonate-Vitamin D 600-400 MG-UNIT per tablet Take 1 tablet by mouth daily.   . meclizine (ANTIVERT) 12.5 MG tablet Take 1 tablet (12.5 mg total) by mouth 2 (two) times daily as needed.  . meloxicam (MOBIC) 15 MG tablet Take 1 tablet (15 mg total) by mouth daily. (Patient taking differently: Take 15 mg by mouth daily as needed. )  . Multiple Vitamin (MULTIVITAMIN) tablet Take 1 tablet by mouth daily.    . Multiple Vitamins-Minerals (PRESERVISION AREDS PO) Take by mouth.  [name] omeprazole (PRILOSEC) 20 MG capsule Take 30- 60 min before your first and last meals of the day  . ondansetron (ZOFRAN) 4 MG tablet TAKE 1/2 TO 1 (ONE-HALF TO ONE) TABLET BY MOUTH TWICE DAILY AS NEEDED  . telmisartan (MICARDIS) 40 MG tablet Take 1 tablet (40 mg total) by mouth daily.  . Testosterone 1.62 % GEL APPLY 4 PUMPS TOPICALLY ONCE DAILY TO SHOULDER AREA  . umeclidinium-vilanterol (ANORO ELLIPTA) 62.5-25 MCG/INH AEPB Inhale 1 puff into the lungs daily.                              Past Medical History:  Diagnosis Date  . Bradycardia    asympotmatic. Holter (2/11) with PACs, 1 run of 5 beats NSVT, HR range 41-125 with no pauses.   . 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 COPD (chronic obstructive pulmonary disease) ([place])   . ED (erectile dysfunction)   . Gastric ulcer   . GERD (gastroesophageal reflux disease)   . Hiatal hernia   . History of stomach cancer    surgery in 2001  . HTN (hypertension)   . Low testosterone   . MR (mitral regurgitation)    echo 2/11: EF 60%, no regional wall abnormalities, mild MR, mild to moderate RV dialation, mild to moderate RV dysfunction   . Osteopenia   . Vertigo   . Vertigo   . Vitamin D deficiency       Objective:      04/11/2020     169   02/27/20 167 lb 12.8 oz (76.1 kg)  01/09/20 167 lb (75.8 kg)  09/13/19 168 lb (76.2 kg)    Pleasant elderly   amb wm nad  Vital signs reviewed  04/11/2020  - Note at rest 02 sats  94% on RA        HEENT : pt wearing mask not removed for exam due to covid - 19 concerns.    NECK :  without JVD/Nodes/TM/ nl carotid upstrokes bilaterally   LUNGS: no acc muscle use,  Mild barrel  contour chest wall with bilateral  Distant bs s audible wheeze and  without cough on insp or exp maneuvers  and mild  Hyperresonant  to  percussion bilaterally     CV:  RRR  no s3 or murmur or increase in P2, and no edema   ABD:  soft and nontender with pos end  insp Hoover's  in the supine position. No bruits or organomegaly appreciated, bowel sounds nl  MS:   Nl gait/  ext warm without deformities, calf tenderness, cyanosis or clubbing No obvious joint restrictions   SKIN: warm and dry without lesions    NEURO:  alert, approp, nl sensorium with  no motor or cerebellar deficits apparent.             Assessment

## 2020-04-11 NOTE — Assessment & Plan Note (Signed)
Stopped smoking 2001 s resp symptoms, later told had emphysema - try off breo and ACEi as of 01/09/2020 > just use saba short term  - alpha one phenotype  02/27/20    MM   Level 134 - PFT's  03/18/20  FEV1 1.44 (48 % ) ratio 0.44  p 11 % improvement from saba p 0 prior to study with DLCO  8.72 (34%) corrects to 1.68 (43%)  for alv volume and FV curve classic concavity/ air trapping present   Pt is Group B in terms of symptom/risk and laba/lama therefore appropriate rx at this point >>>  Continue anoro but use daily first thing in am    Re saba: I spent extra time with pt today reviewing appropriate use of albuterol for prn use on exertion with the following points: 1) saba is for relief of sob that does not improve by walking a slower pace or resting but rather if the pt does not improve after trying this first. 2) If the pt is convinced, as many are, that saba helps recover from activity faster then it's easy to tell if this is the case by re-challenging : ie stop, take the inhaler, then p 5 minutes try the exact same activity (intensity of workload) that just caused the symptoms and see if they are substantially diminished or not after saba 3) if there is an activity that reproducibly causes the symptoms, try the saba 15 min before the activity on alternate days   If in fact the saba really does help, then fine to continue to use it prn but advised may need to look closer at the maintenance regimen being used to achieve better control of airways disease with exertion.

## 2020-04-11 NOTE — Assessment & Plan Note (Signed)
02/27/2020   Walked RA  approx   200 ft  @ avg pace  stopped due to desats to 88% at end of study though denies doe     His goal is walking s needing 02 so needs to remember to take the anoro daily and monitor sats at highest level of ex and avoid desats by pacing better or supplementing with goal of keeping > 90% , reviewed         Each maintenance medication was reviewed in detail including emphasizing most importantly the difference between maintenance and prns and under what circumstances the prns are to be triggered using an action plan format where appropriate.  Total time for H and P, chart review, counseling, teaching devices and generating customized AVS unique to this office visit / charting = 27 min

## 2020-04-25 ENCOUNTER — Other Ambulatory Visit: Payer: Self-pay

## 2020-04-25 ENCOUNTER — Encounter (INDEPENDENT_AMBULATORY_CARE_PROVIDER_SITE_OTHER): Payer: Medicare Other | Admitting: Ophthalmology

## 2020-04-25 DIAGNOSIS — H353122 Nonexudative age-related macular degeneration, left eye, intermediate dry stage: Secondary | ICD-10-CM

## 2020-04-25 DIAGNOSIS — H353211 Exudative age-related macular degeneration, right eye, with active choroidal neovascularization: Secondary | ICD-10-CM

## 2020-04-25 DIAGNOSIS — H43813 Vitreous degeneration, bilateral: Secondary | ICD-10-CM | POA: Diagnosis not present

## 2020-05-08 ENCOUNTER — Other Ambulatory Visit: Payer: Self-pay | Admitting: Nurse Practitioner

## 2020-05-08 DIAGNOSIS — R7989 Other specified abnormal findings of blood chemistry: Secondary | ICD-10-CM

## 2020-05-23 ENCOUNTER — Encounter (INDEPENDENT_AMBULATORY_CARE_PROVIDER_SITE_OTHER): Payer: Medicare Other | Admitting: Ophthalmology

## 2020-05-23 ENCOUNTER — Other Ambulatory Visit: Payer: Self-pay

## 2020-05-23 DIAGNOSIS — H43813 Vitreous degeneration, bilateral: Secondary | ICD-10-CM

## 2020-05-23 DIAGNOSIS — H353122 Nonexudative age-related macular degeneration, left eye, intermediate dry stage: Secondary | ICD-10-CM

## 2020-05-23 DIAGNOSIS — H353211 Exudative age-related macular degeneration, right eye, with active choroidal neovascularization: Secondary | ICD-10-CM | POA: Diagnosis not present

## 2020-05-30 ENCOUNTER — Other Ambulatory Visit: Payer: Self-pay | Admitting: Nurse Practitioner

## 2020-05-30 DIAGNOSIS — R7989 Other specified abnormal findings of blood chemistry: Secondary | ICD-10-CM

## 2020-06-10 ENCOUNTER — Telehealth: Payer: Self-pay | Admitting: Internal Medicine

## 2020-06-10 ENCOUNTER — Other Ambulatory Visit: Payer: Self-pay | Admitting: Internal Medicine

## 2020-06-10 MED ORDER — TELMISARTAN 40 MG PO TABS
40.0000 mg | ORAL_TABLET | Freq: Every day | ORAL | 0 refills | Status: DC
Start: 2020-06-10 — End: 2021-04-27

## 2020-06-10 MED ORDER — ANORO ELLIPTA 62.5-25 MCG/INH IN AEPB: 1.0000 | INHALATION_SPRAY | Freq: Every day | RESPIRATORY_TRACT | 3 refills | Status: DC

## 2020-06-10 NOTE — Telephone Encounter (Signed)
Rx for pt's Anoro Ellipta has been sent to [facility] for pt at their request. Attempted to call pt's wife [name] to let her know that this had been done but unable to reach. Per DPR, it is okay to leave a detailed message on machine so this info was left. Nothing further needed.

## 2020-06-27 ENCOUNTER — Other Ambulatory Visit: Payer: Self-pay

## 2020-06-27 ENCOUNTER — Encounter (INDEPENDENT_AMBULATORY_CARE_PROVIDER_SITE_OTHER): Payer: Medicare Other | Admitting: Ophthalmology

## 2020-06-27 DIAGNOSIS — H353122 Nonexudative age-related macular degeneration, left eye, intermediate dry stage: Secondary | ICD-10-CM | POA: Diagnosis not present

## 2020-06-27 DIAGNOSIS — H353211 Exudative age-related macular degeneration, right eye, with active choroidal neovascularization: Secondary | ICD-10-CM

## 2020-06-27 DIAGNOSIS — H43813 Vitreous degeneration, bilateral: Secondary | ICD-10-CM | POA: Diagnosis not present

## 2020-07-25 ENCOUNTER — Encounter (INDEPENDENT_AMBULATORY_CARE_PROVIDER_SITE_OTHER): Payer: Medicare Other | Admitting: Ophthalmology

## 2020-07-25 ENCOUNTER — Other Ambulatory Visit: Payer: Self-pay

## 2020-07-25 DIAGNOSIS — H353211 Exudative age-related macular degeneration, right eye, with active choroidal neovascularization: Secondary | ICD-10-CM | POA: Diagnosis not present

## 2020-07-25 DIAGNOSIS — H353122 Nonexudative age-related macular degeneration, left eye, intermediate dry stage: Secondary | ICD-10-CM | POA: Diagnosis not present

## 2020-07-25 DIAGNOSIS — H43813 Vitreous degeneration, bilateral: Secondary | ICD-10-CM

## 2020-08-06 ENCOUNTER — Other Ambulatory Visit: Payer: Self-pay | Admitting: Nurse Practitioner

## 2020-08-06 DIAGNOSIS — R7989 Other specified abnormal findings of blood chemistry: Secondary | ICD-10-CM

## 2020-08-27 ENCOUNTER — Encounter (INDEPENDENT_AMBULATORY_CARE_PROVIDER_SITE_OTHER): Payer: Medicare Other | Admitting: Ophthalmology

## 2020-08-27 ENCOUNTER — Other Ambulatory Visit: Payer: Self-pay

## 2020-08-27 DIAGNOSIS — H43813 Vitreous degeneration, bilateral: Secondary | ICD-10-CM

## 2020-08-27 DIAGNOSIS — H353122 Nonexudative age-related macular degeneration, left eye, intermediate dry stage: Secondary | ICD-10-CM

## 2020-08-27 DIAGNOSIS — H353211 Exudative age-related macular degeneration, right eye, with active choroidal neovascularization: Secondary | ICD-10-CM

## 2020-09-26 ENCOUNTER — Other Ambulatory Visit: Payer: Self-pay

## 2020-09-26 ENCOUNTER — Encounter (INDEPENDENT_AMBULATORY_CARE_PROVIDER_SITE_OTHER): Payer: Medicare Other | Admitting: Ophthalmology

## 2020-09-26 DIAGNOSIS — H353211 Exudative age-related macular degeneration, right eye, with active choroidal neovascularization: Secondary | ICD-10-CM

## 2020-09-26 DIAGNOSIS — H43813 Vitreous degeneration, bilateral: Secondary | ICD-10-CM | POA: Diagnosis not present

## 2020-09-26 DIAGNOSIS — H353122 Nonexudative age-related macular degeneration, left eye, intermediate dry stage: Secondary | ICD-10-CM

## 2020-10-13 ENCOUNTER — Other Ambulatory Visit: Payer: Self-pay | Admitting: Internal Medicine

## 2020-10-13 DIAGNOSIS — K219 Gastro-esophageal reflux disease without esophagitis: Secondary | ICD-10-CM

## 2020-10-27 ENCOUNTER — Encounter (INDEPENDENT_AMBULATORY_CARE_PROVIDER_SITE_OTHER): Payer: Medicare Other | Admitting: Ophthalmology

## 2020-10-27 ENCOUNTER — Other Ambulatory Visit: Payer: Self-pay

## 2020-10-27 DIAGNOSIS — H353211 Exudative age-related macular degeneration, right eye, with active choroidal neovascularization: Secondary | ICD-10-CM

## 2020-10-27 DIAGNOSIS — H43813 Vitreous degeneration, bilateral: Secondary | ICD-10-CM

## 2020-10-27 DIAGNOSIS — H353122 Nonexudative age-related macular degeneration, left eye, intermediate dry stage: Secondary | ICD-10-CM

## 2020-10-28 ENCOUNTER — Encounter (INDEPENDENT_AMBULATORY_CARE_PROVIDER_SITE_OTHER): Payer: Medicare Other | Admitting: Ophthalmology

## 2020-11-28 ENCOUNTER — Encounter (INDEPENDENT_AMBULATORY_CARE_PROVIDER_SITE_OTHER): Payer: Medicare Other | Admitting: Ophthalmology

## 2020-11-28 ENCOUNTER — Other Ambulatory Visit: Payer: Self-pay

## 2020-11-28 DIAGNOSIS — H43813 Vitreous degeneration, bilateral: Secondary | ICD-10-CM | POA: Diagnosis not present

## 2020-11-28 DIAGNOSIS — H353122 Nonexudative age-related macular degeneration, left eye, intermediate dry stage: Secondary | ICD-10-CM

## 2020-11-28 DIAGNOSIS — H353211 Exudative age-related macular degeneration, right eye, with active choroidal neovascularization: Secondary | ICD-10-CM

## 2020-12-26 ENCOUNTER — Encounter (INDEPENDENT_AMBULATORY_CARE_PROVIDER_SITE_OTHER): Payer: Medicare Other | Admitting: Ophthalmology

## 2020-12-26 ENCOUNTER — Other Ambulatory Visit: Payer: Self-pay

## 2020-12-26 DIAGNOSIS — H353122 Nonexudative age-related macular degeneration, left eye, intermediate dry stage: Secondary | ICD-10-CM | POA: Diagnosis not present

## 2020-12-26 DIAGNOSIS — H43813 Vitreous degeneration, bilateral: Secondary | ICD-10-CM

## 2020-12-26 DIAGNOSIS — H353211 Exudative age-related macular degeneration, right eye, with active choroidal neovascularization: Secondary | ICD-10-CM

## 2021-01-26 ENCOUNTER — Other Ambulatory Visit: Payer: Self-pay

## 2021-01-26 ENCOUNTER — Encounter (INDEPENDENT_AMBULATORY_CARE_PROVIDER_SITE_OTHER): Payer: Medicare Other | Admitting: Ophthalmology

## 2021-01-26 DIAGNOSIS — H35033 Hypertensive retinopathy, bilateral: Secondary | ICD-10-CM

## 2021-01-26 DIAGNOSIS — I10 Essential (primary) hypertension: Secondary | ICD-10-CM

## 2021-01-26 DIAGNOSIS — H353211 Exudative age-related macular degeneration, right eye, with active choroidal neovascularization: Secondary | ICD-10-CM | POA: Diagnosis not present

## 2021-01-26 DIAGNOSIS — H353122 Nonexudative age-related macular degeneration, left eye, intermediate dry stage: Secondary | ICD-10-CM | POA: Diagnosis not present

## 2021-01-26 DIAGNOSIS — H43813 Vitreous degeneration, bilateral: Secondary | ICD-10-CM

## 2021-01-29 ENCOUNTER — Other Ambulatory Visit: Payer: Self-pay

## 2021-01-29 ENCOUNTER — Encounter: Payer: Self-pay | Admitting: Internal Medicine

## 2021-01-29 ENCOUNTER — Telehealth: Payer: Self-pay | Admitting: Internal Medicine

## 2021-01-29 ENCOUNTER — Ambulatory Visit: Payer: Medicare Other | Admitting: Internal Medicine

## 2021-01-29 VITALS — BP 120/80 | HR 61 | Ht 71.0 in | Wt 164.2 lb

## 2021-01-29 DIAGNOSIS — R609 Edema, unspecified: Secondary | ICD-10-CM

## 2021-01-29 DIAGNOSIS — I10 Essential (primary) hypertension: Secondary | ICD-10-CM

## 2021-01-29 DIAGNOSIS — Z79899 Other long term (current) drug therapy: Secondary | ICD-10-CM

## 2021-01-29 NOTE — Telephone Encounter (Signed)
Please have pt check TSH when returns   ON synthroid   Check when comes in for echo

## 2021-01-29 NOTE — Progress Notes (Signed)
Cardiology Office Note   Date:  01/29/2021   ID:  Patrick Novak, DOB [birth year], MRN [id number]  PCP:  Pcp, No  Cardiologist:   Dorris Carnes, MD   Patient presents for evaluation of edema     History of Present Illness: Patrick Novak is a 80 y.o. male with a history of COPD (on O2), GERD, HTN   He was recently seen by PCP   COmplained of some LE edema  Worried about CHF The pt wears O2 with activity   Breathing has been stable   He denies CP  No palpitations  No dizziness/syncope    Note the pt has been on amlodipine for HTN   This was increased to 10 mg   That is when edema seemed to start   Pulled back   Swelling has improved       Current Meds  Medication Sig   alendronate (FOSAMAX) 70 MG tablet Take 70 mg by mouth once a week.   amLODipine (NORVASC) 5 MG tablet Take 5 mg by mouth daily.   BESIVANCE 0.6 % SUSP Apply to eye.   Calcium Carbonate-Vitamin D 600-400 MG-UNIT per tablet Take 1 tablet by mouth daily.    levothyroxine (SYNTHROID) 25 MCG tablet    meclizine (ANTIVERT) 12.5 MG tablet Take 1 tablet (12.5 mg total) by mouth 2 (two) times daily as needed.   meclizine (ANTIVERT) 12.5 MG tablet Take by mouth.   meloxicam (MOBIC) 15 MG tablet Take 1 tablet (15 mg total) by mouth daily. (Patient taking differently: Take 15 mg by mouth daily as needed.)   Multiple Vitamin (MULTIVITAMIN) tablet Take 1 tablet by mouth daily.     Multiple Vitamins-Minerals (PRESERVISION AREDS PO) Take by mouth.   omeprazole (PRILOSEC) 20 MG capsule TAKE 1 CAPSULE BY MOUTH 30-60 MINUTES BEFORE FIRST AND LAST MEALS OF THE DAY   ondansetron (ZOFRAN) 4 MG tablet TAKE 1/2 TO 1 (ONE-HALF TO ONE) TABLET BY MOUTH TWICE DAILY AS NEEDED   ondansetron (ZOFRAN) 4 MG tablet Take by mouth.   telmisartan (MICARDIS) 40 MG tablet Take 1 tablet (40 mg total) by mouth daily.   Testosterone 1.62 % GEL APPLY 4 PUMPS TOPICALLY ONCE DAILY TO SHOULDER AREA   umeclidinium-vilanterol (ANORO ELLIPTA) 62.5-25 MCG/INH AEPB  Inhale 1 puff into the lungs daily.     Allergies:   Codeine, Hydrocodone-acetaminophen, Ciprofloxacin, Doxycycline, Sulfa antibiotics, and Zithromax [azithromycin]   Past Medical History:  Diagnosis Date   Bradycardia    asympotmatic. Holter (2/11) with PACs, 1 run of 5 beats NSVT, HR range 41-125 with no pauses.    Cancer ([place])    gastric lymphoma   COPD (chronic obstructive pulmonary disease) (HCC)    ED (erectile dysfunction)    Gastric ulcer    GERD (gastroesophageal reflux disease)    Hiatal hernia    History of stomach cancer    surgery in 2001   HTN (hypertension)    Low testosterone    MR (mitral regurgitation)    echo 2/11: EF 60%, no regional wall abnormalities, mild MR, mild to moderate RV dialation, mild to moderate RV dysfunction    Osteopenia    Vertigo    Vertigo    Vitamin D deficiency     Past Surgical History:  Procedure Laterality Date   CHOLECYSTECTOMY  1995   radical subtotal gastrectomy  09/1999     Social History:  The patient  reports that he quit smoking about 21 years ago. His smoking  use included cigarettes. He has a 70.50 pack-year smoking history. He has never used smokeless tobacco. He reports that he does not drink alcohol and does not use drugs.   Family History:  The patient's family history includes Alzheimer's disease in his sister; Cancer in his sister; Early death in his brother; Heart disease in his mother and sister; Hepatitis in his sister; Hip fracture in his sister; Lung disease in his sister; Stroke in his brother and father; Valvular heart disease in his sister.    ROS:  Please see the history of present illness. All other systems are reviewed and  Negative to the above problem except as noted.    PHYSICAL EXAM: VS:  BP 120/80   Pulse 61   Ht '5\' 11"'$  (1.803 m)   Wt 164 lb 3.2 oz (74.5 kg)   SpO2 94%   BMI 22.90 kg/m   GEN: Well nourished, well developed, in no acute distress  HEENT: normal  Neck: no JVD, carotid  bruits Cardiac: RRR; no murmurs, rubs, or gallops,no edema  Respiratory:  Decreased airflow   GI: soft, nontender, nondistended, + BS  No hepatomegaly  MS: no deformity Moving all extremities   Skin: warm and dry, no rash Neuro:  Strength and sensation are intact Psych: euthymic mood, full affect   EKG:  EKG is ordered today.   NSR   62 bpm   LAFB     Lipid Panel    Component Value Date/Time   CHOL 132 09/13/2019 1240   TRIG 154 (H) 09/13/2019 1240   HDL 38 (L) 09/13/2019 1240   CHOLHDL 3.5 09/13/2019 1240   LDLCALC 67 09/13/2019 1240      Wt Readings from Last 3 Encounters:  01/29/21 164 lb 3.2 oz (74.5 kg)  04/11/20 169 lb 12.8 oz (77 kg)  02/27/20 167 lb 12.8 oz (76.1 kg)      ASSESSMENT AND PLAN:  1  Edema  Pt had transient LE swelling   Gone now    May have been related to amlodipine   Still wold get echo to evaluate LVEF / RVEF     2  COPD   On O2 with activity   COntinue  3  HTN  BP is OK     4  Thyroid  Continue meds   Need to confirm taking   Will get TSH when comes in for echo  I dont see that it is drawn  5  HL   LDL 85  HDL 35  Trig 214   Total 156  Watch carbs     Current medicines are reviewed at length with the patient today.  The patient does not have concerns regarding medicines.  Signed, Dorris Carnes, MD  01/29/2021 11:19 AM    [address], [place], [place]  [address] Phone: [phone]; Fax: [phone]

## 2021-01-29 NOTE — Patient Instructions (Signed)
Medication Instructions:  Your physician recommends that you continue on your current medications as directed. Please refer to the Current Medication list given to you today.  *If you need a refill on your cardiac medications before your next appointment, please call your pharmacy*   Lab Work: None If you have labs (blood work) drawn today and your tests are completely normal, you will receive your results only by: [place] (if you have MyChart) OR A paper copy in the mail If you have any lab test that is abnormal or we need to change your treatment, we will call you to review the results.   Testing/Procedures: Your physician has requested that you have an echocardiogram. Echocardiography is a painless test that uses sound waves to create images of your heart. It provides your doctor with information about the size and shape of your heart and how well your heart's chambers and valves are working. This procedure takes approximately one hour. There are no restrictions for this procedure.    Follow-Up: At [facility], you and your health needs are our priority.  As part of our continuing mission to provide you with exceptional heart care, we have created designated Provider Care Teams.  These Care Teams include your primary Cardiologist (physician) and Advanced Practice Providers (APPs -  Physician Assistants and Nurse Practitioners) who all work together to provide you with the care you need, when you need it.  We recommend signing up for the patient portal called "MyChart".  Sign up information is provided on this After Visit Summary.  MyChart is used to connect with patients for Virtual Visits (Telemedicine).  Patients are able to view lab/test results, encounter notes, upcoming appointments, etc.  Non-urgent messages can be sent to your provider as well.   To learn more about what you can do with MyChart, go to [email or web address].    Your next appointment:   Follow Up: TO BE  DETERMINED   Other Instructions

## 2021-01-29 NOTE — Telephone Encounter (Signed)
Pt.notified

## 2021-01-29 NOTE — Telephone Encounter (Signed)
New message    Patient returning call to Kisha  

## 2021-01-29 NOTE — Telephone Encounter (Signed)
Called to notify pt. No answer. Left msg to call back.  

## 2021-01-29 NOTE — Addendum Note (Signed)
Addended by: Levonne Hubert on: 01/29/2021 12:33 PM   Modules accepted: Orders

## 2021-01-30 NOTE — Telephone Encounter (Signed)
Alda Berthold spoke with patient, TSH added to labs

## 2021-02-16 ENCOUNTER — Ambulatory Visit: Payer: Medicare Other | Admitting: Dermatology

## 2021-02-19 ENCOUNTER — Ambulatory Visit: Payer: Medicare Other | Admitting: Internal Medicine

## 2021-02-20 ENCOUNTER — Other Ambulatory Visit: Payer: Self-pay

## 2021-02-20 ENCOUNTER — Ambulatory Visit (HOSPITAL_COMMUNITY)
Admission: RE | Admit: 2021-02-20 | Discharge: 2021-02-20 | Disposition: A | Discharge status: Home / Self Care | Payer: Medicare Other | Source: Ambulatory Visit | Attending: Internal Medicine | Admitting: Internal Medicine

## 2021-02-20 ENCOUNTER — Other Ambulatory Visit (HOSPITAL_COMMUNITY)
Admission: RE | Admit: 2021-02-20 | Discharge: 2021-02-20 | Disposition: A | Discharge status: Home / Self Care | Payer: Medicare Other | Source: Ambulatory Visit | Attending: Internal Medicine | Admitting: Internal Medicine

## 2021-02-20 DIAGNOSIS — R609 Edema, unspecified: Secondary | ICD-10-CM | POA: Diagnosis present

## 2021-02-20 DIAGNOSIS — I10 Essential (primary) hypertension: Secondary | ICD-10-CM | POA: Insufficient documentation

## 2021-02-20 DIAGNOSIS — K219 Gastro-esophageal reflux disease without esophagitis: Secondary | ICD-10-CM | POA: Insufficient documentation

## 2021-02-20 DIAGNOSIS — R600 Localized edema: Secondary | ICD-10-CM | POA: Diagnosis not present

## 2021-02-20 DIAGNOSIS — J449 Chronic obstructive pulmonary disease, unspecified: Secondary | ICD-10-CM | POA: Insufficient documentation

## 2021-02-20 DIAGNOSIS — Z79899 Other long term (current) drug therapy: Secondary | ICD-10-CM

## 2021-02-20 DIAGNOSIS — I340 Nonrheumatic mitral (valve) insufficiency: Secondary | ICD-10-CM | POA: Insufficient documentation

## 2021-02-20 LAB — TSH: TSH: 2.856 u[IU]/mL (ref 0.350–4.500)

## 2021-02-20 LAB — ECHOCARDIOGRAM COMPLETE
Area-P 1/2: 3.53 cm2
S' Lateral: 2.2 cm

## 2021-02-20 NOTE — Progress Notes (Signed)
*  PRELIMINARY RESULTS* Echocardiogram 2D Echocardiogram has been performed.  Patrick Novak 02/20/2021, 3:52 PM

## 2021-02-26 ENCOUNTER — Encounter (INDEPENDENT_AMBULATORY_CARE_PROVIDER_SITE_OTHER): Payer: Medicare Other | Admitting: Ophthalmology

## 2021-02-26 ENCOUNTER — Other Ambulatory Visit: Payer: Self-pay

## 2021-02-26 DIAGNOSIS — H353122 Nonexudative age-related macular degeneration, left eye, intermediate dry stage: Secondary | ICD-10-CM | POA: Diagnosis not present

## 2021-02-26 DIAGNOSIS — H43813 Vitreous degeneration, bilateral: Secondary | ICD-10-CM

## 2021-02-26 DIAGNOSIS — H353211 Exudative age-related macular degeneration, right eye, with active choroidal neovascularization: Secondary | ICD-10-CM

## 2021-03-03 ENCOUNTER — Encounter: Payer: Self-pay | Admitting: Registered Nurse

## 2021-03-03 ENCOUNTER — Other Ambulatory Visit: Payer: Self-pay

## 2021-03-03 ENCOUNTER — Ambulatory Visit (INDEPENDENT_AMBULATORY_CARE_PROVIDER_SITE_OTHER): Payer: Medicare Other | Admitting: Registered Nurse

## 2021-03-03 VITALS — BP 120/51 | HR 60 | Temp 98.2°F | Resp 16 | Ht 71.0 in | Wt 166.4 lb

## 2021-03-03 DIAGNOSIS — G8929 Other chronic pain: Secondary | ICD-10-CM | POA: Diagnosis not present

## 2021-03-03 DIAGNOSIS — R5382 Chronic fatigue, unspecified: Secondary | ICD-10-CM | POA: Diagnosis not present

## 2021-03-03 DIAGNOSIS — Z8639 Personal history of other endocrine, nutritional and metabolic disease: Secondary | ICD-10-CM | POA: Diagnosis not present

## 2021-03-03 DIAGNOSIS — R42 Dizziness and giddiness: Secondary | ICD-10-CM

## 2021-03-03 DIAGNOSIS — I10 Essential (primary) hypertension: Secondary | ICD-10-CM

## 2021-03-03 DIAGNOSIS — Z23 Encounter for immunization: Secondary | ICD-10-CM

## 2021-03-03 DIAGNOSIS — M5440 Lumbago with sciatica, unspecified side: Secondary | ICD-10-CM

## 2021-03-03 DIAGNOSIS — R7989 Other specified abnormal findings of blood chemistry: Secondary | ICD-10-CM

## 2021-03-03 DIAGNOSIS — M5441 Lumbago with sciatica, right side: Secondary | ICD-10-CM

## 2021-03-03 LAB — CBC WITH DIFFERENTIAL/PLATELET
Basophils Absolute: 0 10*3/uL (ref 0.0–0.1)
Basophils Relative: 0.7 % (ref 0.0–3.0)
Eosinophils Absolute: 0.2 10*3/uL (ref 0.0–0.7)
Eosinophils Relative: 2.4 % (ref 0.0–5.0)
HCT: 42.5 % (ref 39.0–52.0)
Hemoglobin: 14 g/dL (ref 13.0–17.0)
Lymphocytes Relative: 22.6 % (ref 12.0–46.0)
Lymphs Abs: 1.7 10*3/uL (ref 0.7–4.0)
MCHC: 33 g/dL (ref 30.0–36.0)
MCV: 95.3 fl (ref 78.0–100.0)
Monocytes Absolute: 0.6 10*3/uL (ref 0.1–1.0)
Monocytes Relative: 7.6 % (ref 3.0–12.0)
Neutro Abs: 4.9 10*3/uL (ref 1.4–7.7)
Neutrophils Relative %: 66.7 % (ref 43.0–77.0)
Platelets: 290 10*3/uL (ref 150.0–400.0)
RBC: 4.46 Mil/uL (ref 4.22–5.81)
RDW: 12.9 % (ref 11.5–15.5)
WBC: 7.4 10*3/uL (ref 4.0–10.5)

## 2021-03-03 LAB — COMPREHENSIVE METABOLIC PANEL
ALT: 23 U/L (ref 0–53)
AST: 19 U/L (ref 0–37)
Albumin: 3.8 g/dL (ref 3.5–5.2)
Alkaline Phosphatase: 124 U/L — ABNORMAL HIGH (ref 39–117)
BUN: 18 mg/dL (ref 6–23)
CO2: 31 mEq/L (ref 19–32)
Calcium: 9.4 mg/dL (ref 8.4–10.5)
Chloride: 101 mEq/L (ref 96–112)
Creatinine, Ser: 1.12 mg/dL (ref 0.40–1.50)
GFR: 61.71 mL/min (ref >60.00)
Glucose, Bld: 91 mg/dL (ref 70–99)
Potassium: 4.3 mEq/L (ref 3.5–5.1)
Sodium: 139 mEq/L (ref 135–145)
Total Bilirubin: 0.8 mg/dL (ref 0.2–1.2)
Total Protein: 6.1 g/dL (ref 6.0–8.3)

## 2021-03-03 LAB — HEMOGLOBIN A1C: Hgb A1c MFr Bld: 5.6 % (ref 4.6–6.5)

## 2021-03-03 LAB — LIPID PANEL
Cholesterol: 134 mg/dL (ref 0–200)
HDL: 38 mg/dL — ABNORMAL LOW (ref >39.00)
LDL Cholesterol: 69 mg/dL (ref 0–99)
NonHDL: 96.12
Total CHOL/HDL Ratio: 4
Triglycerides: 138 mg/dL (ref 0.0–149.0)
VLDL: 27.6 mg/dL (ref 0.0–40.0)

## 2021-03-03 LAB — TSH: TSH: 3.6 u[IU]/mL (ref 0.35–5.50)

## 2021-03-03 LAB — TESTOSTERONE: Testosterone: 212.99 ng/dL — ABNORMAL LOW (ref 300.00–890.00)

## 2021-03-03 MED ORDER — MELOXICAM 15 MG PO TABS: 15.0000 mg | ORAL_TABLET | Freq: Every day | ORAL | 2 refills | Status: DC

## 2021-03-03 MED ORDER — METHYLPREDNISOLONE ACETATE 80 MG/ML IJ SUSP
80.0000 mg | Freq: Once | INTRAMUSCULAR | Status: AC
  Administered 2021-03-03: 80 mg via INTRAMUSCULAR

## 2021-03-03 MED ORDER — MECLIZINE HCL 12.5 MG PO TABS: 12.5000 mg | ORAL_TABLET | Freq: Two times a day (BID) | ORAL | 0 refills | Status: AC | PRN

## 2021-03-03 NOTE — Progress Notes (Signed)
New Patient Office Visit  Subjective:  Patient ID: Patrick Novak, male    DOB: August 13, 1940  Age: 80 y.o. MRN: [id number]  CC:  Chief Complaint  Patient presents with   New Patient (Initial Visit)    Patient is here to establish care and also having some right side pain for about 6 month. He has not been able to take any medication for pain. And medication refill.    HPI Patrick Novak presents for visit to est care.  Formerly pt of [name] NP at [facility].  Histories reviewed and updated with patient.   COPD Managed by Dr. Luan Pulling in Pulmonology. Stable. No concerns. No need for refills at this time. Using: umeclidinium-vilanterol 62.5-25mcg 1 puff daily  HTN, Bradycardia Asymptomatic. Bp today at Taking amlodipine '5mg'$  po qd, telmisartan '40mg'$  po qd Good effect, no AE. Managed by Cardiology Dr. Aundra Dubin, Dr. Harrington Challenger  Hx of stomach ca Remote hx. Gastrectomy and tx 20 years ago, no recurrence. Had been on surveillance for some time but has been released.   Vertigo On meclizine prn with good effect.  HLD Last lipids with fam med through [facility] - showing  Total cholesterol to 156 Triglycerides to 214 (H) HDL to 35 (L) LDL at 85.  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 Looks like last bone density screening in 2018 at [facility]. Fairly stable from previosu study on 09/20/2014  Multiple Nevi Follows with Dr. [place] Crane in derm for routine monitoring No new concerns  Past Medical History:  Diagnosis Date   Bradycardia    asympotmatic. Holter (2/11) with PACs, 1 run of 5 beats NSVT, HR range 41-125 with no pauses.    Cancer ([place])    gastric lymphoma   COPD (chronic obstructive pulmonary disease) (HCC)    ED (erectile dysfunction)    Gastric ulcer    GERD (gastroesophageal reflux disease)    Hiatal hernia    History of stomach cancer    surgery in 2001   HTN (hypertension)    Low testosterone    MR (mitral regurgitation)    echo 2/11: EF  60%, no regional wall abnormalities, mild MR, mild to moderate RV dialation, mild to moderate RV dysfunction    Osteopenia    Vertigo    Vertigo    Vitamin D deficiency     Past Surgical History:  Procedure Laterality Date   CHOLECYSTECTOMY  1995   radical subtotal gastrectomy  09/1999    Family History  Problem Relation Age of Onset   Stroke Father    Heart disease Mother    Stroke Brother    Hip fracture Sister    Cancer Sister        breast   Heart disease Sister    Hepatitis Sister    Valvular heart disease Sister    Alzheimer's disease Sister    Lung disease Sister    Early death Brother     Social History   Socioeconomic History   Marital status: Married    Spouse name: [name]   Number of children: 4   Years of education: 11   Highest education level: GED or equivalent  Occupational History   Occupation: retired  Tobacco Use   Smoking status: Former    Packs/day: 1.50    Years: 47.00    Pack years: 70.50    Types: Cigarettes    Quit date: 08/21/1999    Years since quitting: 21.5   Smokeless  tobacco: Never   Tobacco comments:    quit in 2000  Vaping Use   Vaping Use: Never used  Substance and Sexual Activity   Alcohol use: No   Drug use: No   Sexual activity: Yes    Birth control/protection: None  Other Topics Concern   Not on file  Social History Narrative   Married, lives in [place].   Retired Psychologist, sport and exercise, does some carpentry.    Social Determinants of Health   Financial Resource Strain: Not on file  Food Insecurity: Not on file  Transportation Needs: Not on file  Physical Activity: Not on file  Stress: Not on file  Social Connections: Not on file  Intimate Partner Violence: Not on file    ROS Review of Systems  Constitutional: Negative.   HENT: Negative.    Eyes: Negative.   Respiratory: Negative.    Cardiovascular: Negative.   Gastrointestinal: Negative.   Genitourinary: Negative.   Musculoskeletal: Negative.   Skin: Negative.    Neurological: Negative.   Psychiatric/Behavioral: Negative.    All other systems reviewed and are negative.  Objective:   Today's Vitals: BP (!) 120/51   Pulse 60   Temp 98.2 F (36.8 C) (Temporal)   Resp 16   Ht '5\' 11"'$  (1.803 m)   Wt 166 lb 6.4 oz (75.5 kg)   BMI 23.21 kg/m   Physical Exam Vitals and nursing note reviewed.  Constitutional:      Appearance: Normal appearance. He is normal weight.  Cardiovascular:     Rate and Rhythm: Normal rate and regular rhythm.     Pulses: Normal pulses.     Heart sounds: Normal heart sounds. No murmur heard.   No friction rub. No gallop.  Pulmonary:     Effort: Pulmonary effort is normal. No respiratory distress.     Breath sounds: Normal breath sounds. No stridor. No wheezing, rhonchi or rales.  Neurological:     General: No focal deficit present.     Mental Status: He is alert. Mental status is at baseline.  Psychiatric:        Mood and Affect: Mood normal.        Behavior: Behavior normal.        Thought Content: Thought content normal.        Judgment: Judgment normal.    Assessment & Plan:   Problem List Items Addressed This Visit       Other   Low testosterone   Relevant Orders   Testosterone   Other Visit Diagnoses     Chronic bilateral low back pain with sciatica, sciatica laterality unspecified    -  Primary   Relevant Medications   tizanidine (ZANAFLEX) 2 MG capsule   meloxicam (MOBIC) 15 MG tablet   Dizziness       Relevant Medications   meclizine (ANTIVERT) 12.5 MG tablet   Other Relevant Orders   Lipid panel   Flu vaccine need       Relevant Orders   Flu Vaccine QUAD High Dose(Fluad)   Chronic fatigue       Relevant Orders   Comprehensive metabolic panel   CBC with Differential/Platelet   Lipid panel   TSH   History of elevated glucose       Relevant Orders   Hemoglobin A1c       Outpatient Encounter Medications as of 03/03/2021  Medication Sig   alendronate (FOSAMAX) 70 MG tablet Take 70  mg by mouth once a week.  amLODipine (NORVASC) 5 MG tablet Take 5 mg by mouth daily.   BESIVANCE 0.6 % SUSP Apply to eye.   Calcium Carbonate-Vitamin D 600-400 MG-UNIT per tablet Take 1 tablet by mouth daily.    levothyroxine (SYNTHROID) 25 MCG tablet    meclizine (ANTIVERT) 12.5 MG tablet Take by mouth.   Multiple Vitamin (MULTIVITAMIN) tablet Take 1 tablet by mouth daily.     Multiple Vitamins-Minerals (PRESERVISION AREDS PO) Take by mouth.   omeprazole (PRILOSEC) 20 MG capsule TAKE 1 CAPSULE BY MOUTH 30-60 MINUTES BEFORE FIRST AND LAST MEALS OF THE DAY   ondansetron (ZOFRAN) 4 MG tablet TAKE 1/2 TO 1 (ONE-HALF TO ONE) TABLET BY MOUTH TWICE DAILY AS NEEDED   ondansetron (ZOFRAN) 4 MG tablet Take by mouth.   telmisartan (MICARDIS) 40 MG tablet Take 1 tablet (40 mg total) by mouth daily.   Testosterone 1.62 % GEL APPLY 4 PUMPS TOPICALLY ONCE DAILY TO SHOULDER AREA   tizanidine (ZANAFLEX) 2 MG capsule Take 2 mg by mouth 3 (three) times daily.   umeclidinium-vilanterol (ANORO ELLIPTA) 62.5-25 MCG/INH AEPB Inhale 1 puff into the lungs daily.   [DISCONTINUED] meclizine (ANTIVERT) 12.5 MG tablet Take 1 tablet (12.5 mg total) by mouth 2 (two) times daily as needed.   [DISCONTINUED] meloxicam (MOBIC) 15 MG tablet Take 1 tablet (15 mg total) by mouth daily. (Patient taking differently: Take 15 mg by mouth daily as needed.)   meclizine (ANTIVERT) 12.5 MG tablet Take 1 tablet (12.5 mg total) by mouth 2 (two) times daily as needed.   meloxicam (MOBIC) 15 MG tablet Take 1 tablet (15 mg total) by mouth daily.   No facility-administered encounter medications on file as of 03/03/2021.    Follow-up: Return in about 3 months (around 06/02/2021) for Chronic conditions.   PLAN Refill meclizine and meloxicam Giving IM depo medrol for back and hip pain Labs collected. Will follow up with the patient as warranted. Include testosterone in labs. Consider fatigue in context of testosterone deficiency Return in  3 mo Patient encouraged to call clinic with any questions, comments, or concerns.  Maximiano Coss, NP

## 2021-03-03 NOTE — Patient Instructions (Addendum)
Mr. Gonyeau -   Doristine Devoid to meet you  Checking on labs today to investigate fatigue - this will include testosterone. I will find out why it was stopped and the thoughts on it's danger. If we need to supplement this, we can arrange for endocrinology to monitor if we have true worries.   Meclizine and Meloxicam refilled  I will call with urgent concerns  See you both in 3 months, sooner if you need anything  Thanks,  Rich     If you have lab work done today you will be contacted with your lab results within the next 2 weeks.  If you have not heard from [place] then please contact us. The fastest way to get your results is to register for My Chart.   IF you received an x-ray today, you will receive an invoice from [facility]. Please contact [facility] at [phone] with questions or concerns regarding your invoice.   IF you received labwork today, you will receive an invoice from [facility]. Please contact [facility] at [phone] with questions or concerns regarding your invoice.   Our billing staff will not be able to assist you with questions regarding bills from these companies.  You will be contacted with the lab results as soon as they are available. The fastest way to get your results is to activate your My Chart account. Instructions are located on the last page of this paperwork. If you have not heard from [place] regarding the results in 2 weeks, please contact this office.

## 2021-03-16 ENCOUNTER — Telehealth: Payer: Self-pay

## 2021-03-16 ENCOUNTER — Other Ambulatory Visit: Payer: Self-pay

## 2021-03-16 DIAGNOSIS — R42 Dizziness and giddiness: Secondary | ICD-10-CM

## 2021-03-16 MED ORDER — ONDANSETRON HCL 4 MG PO TABS: ORAL_TABLET | ORAL | 0 refills | Status: DC

## 2021-03-16 NOTE — Telephone Encounter (Signed)
.   Encourage patient to contact the pharmacy for refills or they can request refills through [place]:  Please schedule appointment if longer than 1 year  NEXT APPOINTMENT DATE:  MEDICATION:ondansetron (ZOFRAN) 4 MG tablet meclizine (ANTIVERT) 12.5 MG tablet  Is the patient out of medication?   [place], [place] [address]  Let patient know to contact pharmacy at the end of the day to make sure medication is ready.  Please notify patient to allow 48-72 hours to process  CLINICAL FILLS OUT ALL BELOW:   LAST REFILL:  QTY:  REFILL DATE:    OTHER COMMENTS:    Okay for refill?  Please advise

## 2021-03-16 NOTE — Telephone Encounter (Signed)
Rx has been filled and sent to patient pharmacy.

## 2021-03-24 DIAGNOSIS — X32XXXA Exposure to sunlight, initial encounter: Secondary | ICD-10-CM | POA: Diagnosis not present

## 2021-03-24 DIAGNOSIS — L820 Inflamed seborrheic keratosis: Secondary | ICD-10-CM | POA: Diagnosis not present

## 2021-03-24 DIAGNOSIS — D225 Melanocytic nevi of trunk: Secondary | ICD-10-CM | POA: Diagnosis not present

## 2021-03-24 DIAGNOSIS — Z08 Encounter for follow-up examination after completed treatment for malignant neoplasm: Secondary | ICD-10-CM | POA: Diagnosis not present

## 2021-03-24 DIAGNOSIS — L570 Actinic keratosis: Secondary | ICD-10-CM | POA: Diagnosis not present

## 2021-03-24 DIAGNOSIS — Z85828 Personal history of other malignant neoplasm of skin: Secondary | ICD-10-CM | POA: Diagnosis not present

## 2021-03-27 ENCOUNTER — Encounter (INDEPENDENT_AMBULATORY_CARE_PROVIDER_SITE_OTHER): Payer: Medicare Other | Admitting: Ophthalmology

## 2021-03-27 ENCOUNTER — Other Ambulatory Visit: Payer: Self-pay

## 2021-03-27 DIAGNOSIS — H43813 Vitreous degeneration, bilateral: Secondary | ICD-10-CM | POA: Diagnosis not present

## 2021-03-27 DIAGNOSIS — H353122 Nonexudative age-related macular degeneration, left eye, intermediate dry stage: Secondary | ICD-10-CM

## 2021-03-27 DIAGNOSIS — H353211 Exudative age-related macular degeneration, right eye, with active choroidal neovascularization: Secondary | ICD-10-CM | POA: Diagnosis not present

## 2021-03-30 DIAGNOSIS — J449 Chronic obstructive pulmonary disease, unspecified: Secondary | ICD-10-CM | POA: Diagnosis not present

## 2021-04-06 ENCOUNTER — Other Ambulatory Visit: Payer: Self-pay | Admitting: Registered Nurse

## 2021-04-06 DIAGNOSIS — R42 Dizziness and giddiness: Secondary | ICD-10-CM

## 2021-04-06 NOTE — Telephone Encounter (Signed)
Patient is requesting a refill of the following medications: Requested Prescriptions   Pending Prescriptions Disp Refills   ondansetron (ZOFRAN) 4 MG tablet [Pharmacy Med Name: Ondansetron HCl 4 MG Oral Tablet] 12 tablet 0    Sig: TAKE 1/2 TO 1 (ONE-HALF TO ONE) TABLET BY MOUTH TWICE DAILY AS NEEDED    Date of patient request:04/06/2021 Last office visit: 03/03/2021 Date of last refill: 12 tablets Last refill amount: 03/16/2021 Follow up time period per chart: 06/02/2021

## 2021-04-20 ENCOUNTER — Encounter: Payer: Self-pay | Admitting: Internal Medicine

## 2021-04-20 ENCOUNTER — Other Ambulatory Visit: Payer: Self-pay

## 2021-04-20 ENCOUNTER — Ambulatory Visit: Payer: Medicare Other | Admitting: Internal Medicine

## 2021-04-20 DIAGNOSIS — J9611 Chronic respiratory failure with hypoxia: Secondary | ICD-10-CM | POA: Diagnosis not present

## 2021-04-20 DIAGNOSIS — J449 Chronic obstructive pulmonary disease, unspecified: Secondary | ICD-10-CM

## 2021-04-20 DIAGNOSIS — R058 Other specified cough: Secondary | ICD-10-CM | POA: Diagnosis not present

## 2021-04-20 NOTE — Patient Instructions (Signed)
If the cough gets worse you may need a trial off Fosamax (Reclast can be given yearly and does not cause cough)   Be sure to rinse and gargle after anoro   GERD (REFLUX)  is an extremely common cause of respiratory symptoms just like yours , many times with no obvious heartburn at all.    It can be treated with medication, but also with lifestyle changes including elevation of the head of your bed (ideally with 6 -8inch blocks under the headboard of your bed),  Smoking cessation, avoidance of late meals, excessive alcohol, and avoid fatty foods, chocolate, peppermint, colas, red wine, and acidic juices such as orange juice.  NO MINT OR MENTHOL PRODUCTS SO NO COUGH DROPS  USE SUGARLESS CANDY INSTEAD (Jolley ranchers or Stover's or Life Savers) or even ice chips will also do - the key is to swallow to prevent all throat clearing. NO OIL BASED VITAMINS - use powdered substitutes.  Avoid fish oil when coughing.    Make sure you check your oxygen saturation at your highest level of activity to be sure it stays over 90% and keep track of it at least once a week, more often if breathing getting worse, and let me know if losing ground and supplement with  your portable 02 if falling less than 89% (call for trial of portable concentrator if you need it)    Please schedule a follow up visit in 12 months but call sooner if needed

## 2021-04-20 NOTE — Progress Notes (Signed)
Patrick Novak, male    DOB: [birth year],    MRN: [id number]   Brief patient profile:  47 yowm quit smoking 2001 due to gastric ca s/p chemo then bad spell of breathing > [name] admitted with pna dx emphysema and ever since then prn saba then [facility] added by Dr Luan Pulling but never back to baseline  main c/o has been variable cough > sob and self referred back to [place] [facility]    History of Present Illness  01/09/2020  Pulmonary/ 1st office eval/[place]/ Patrick Novak on ACEi and Breo  Chief Complaint  Patient presents with   Consult    Patient has COPD and has shortness of breath with exertion. Denies cough. Uses [facility] is going to ask about switching  Dyspnea:  MMRC1 = can walk nl pace, flat grade, can't hurry or go uphills or steps s sob  Cough: variable cough when something stuck  Sleep: ok flat  SABA use: rarely  02 prn  rec Change prilosec to Take 30-60 min before first meal of the day > did not do  Stop breo - ok to restart if you are losing ground with breathing >  Did do Only use your albuterol as a rescue medication > about the same use  Make sure you check your oxygen saturations at highest level of activity to be sure it stays over 90%  And if it does you do not need oxygen> did not do  Stop lisinopril and start micardis (telmarsartan) 40 mg  One daily-  ok to break in half if too strong Please schedule a follow up office visit in 6 weeks with PFTs, call sooner if needed    02/27/2020  f/u ov/Patrick Novak re: ? Copd/ no worse off breo / cough no change off acei  Chief Complaint  Patient presents with   Follow-up    shortness of breath with exertion, productive cough with white phlegm  Dyspnea:  No change = MMRC1 = can walk nl pace, flat grade, can't hurry or go uphills or steps s sob   Cough: more cough in am congested sounding but mucoid  Sleeping: flat on bed / one pillow  SABA use: rarely now  02: not using rec Try albuterol 15 min before an activity  Also use your albuterol  as a rescue medication   Prilosec (omeprazole)  20 mg Take 30- 60 min before your first and last meals of the day  GERD rx   04/11/2020  f/u ov/[place] office/Patrick Novak re:  GOLD III, ex desats supposed to be on anoro not taking daily  Chief Complaint  Patient presents with   Follow-up    shortness of breath with activity  Dyspnea:  Some better, playing 18 holes golf on Anoro/ but did not take on day of ov Cough: better to his satisfaction Sleeping: flat bed/ one pillow sleeps fine SABA use:no longer   02: has it not using but also not checking sats  Rec Only use your albuterol as a rescue medication  Try albuterol 15 min before an activity that you know would make you short of breath  To get the most out of exercise, you need to be continuously aware that you are short of breath, but never out of breath, for 30 minutes daily  Make sure you check your oxygen saturations at highest level of activity    04/20/2021  f/u ov/[place] office/Patrick Novak re: GOLD 3 copd maint on anoro   Chief Complaint  Patient presents with  Follow-up    No SOB. Cough is same as last visit. Mainly a dry cough.  2L O2 at night time.   Dyspnea:  still able to play golf Cough: cough is dry / daytime with throat clearing on ppi and fosamax Sleeping: flat bed/ one pillow sleep fine / no am flare  SABA use: only anoro 02: 2 lpm  hs and prn Covid status: vax x 3      No obvious day to day or daytime variability or assoc excess/ purulent sputum or mucus plugs or hemoptysis or cp or chest tightness, subjective wheeze or overt sinus or hb symptoms.   Sleeping as above without nocturnal  or early am exacerbation  of respiratory  c/o's or need for noct saba. Also denies any obvious fluctuation of symptoms with weather or environmental changes or other aggravating or alleviating factors except as outlined above   No unusual exposure hx or h/o childhood pna/ asthma or knowledge of premature birth.  Current  Allergies, Complete Past Medical History, Past Surgical History, Family History, and Social History were reviewed in [employer] record.  ROS  The following are not active complaints unless bolded Hoarseness, sore throat, dysphagia, dental problems, itching, sneezing,  nasal congestion or discharge of excess mucus or purulent secretions, ear ache,   fever, chills, sweats, unintended wt loss or wt gain, classically pleuritic or exertional cp,  orthopnea pnd or arm/hand swelling  or leg swelling, presyncope, palpitations, abdominal pain, anorexia, nausea, vomiting, diarrhea  or change in bowel habits or change in bladder habits, change in stools or change in urine, dysuria, hematuria,  rash, arthralgias, visual complaints, headache, numbness, weakness or ataxia or problems with walking or coordination,  change in mood or  memory.        Current Meds  Medication Sig   alendronate (FOSAMAX) 70 MG tablet Take 70 mg by mouth once a week.   amLODipine (NORVASC) 5 MG tablet Take 5 mg by mouth daily.   BESIVANCE 0.6 % SUSP Apply to eye.   Calcium Carbonate-Vitamin D 600-400 MG-UNIT per tablet Take 1 tablet by mouth daily.    levothyroxine (SYNTHROID) 25 MCG tablet    meclizine (ANTIVERT) 12.5 MG tablet Take by mouth.   meclizine (ANTIVERT) 12.5 MG tablet Take 1 tablet (12.5 mg total) by mouth 2 (two) times daily as needed.   meloxicam (MOBIC) 15 MG tablet Take 1 tablet (15 mg total) by mouth daily.   Multiple Vitamin (MULTIVITAMIN) tablet Take 1 tablet by mouth daily.     Multiple Vitamins-Minerals (PRESERVISION AREDS PO) Take by mouth.   omeprazole (PRILOSEC) 20 MG capsule TAKE 1 CAPSULE BY MOUTH 30-60 MINUTES BEFORE FIRST AND LAST MEALS OF THE DAY   ondansetron (ZOFRAN) 4 MG tablet Take by mouth.   ondansetron (ZOFRAN) 4 MG tablet TAKE 1/2 TO 1 (ONE-HALF TO ONE) TABLET BY MOUTH TWICE DAILY AS NEEDED   telmisartan (MICARDIS) 40 MG tablet Take 1 tablet (40 mg total) by mouth daily.    Testosterone 1.62 % GEL APPLY 4 PUMPS TOPICALLY ONCE DAILY TO SHOULDER AREA   tizanidine (ZANAFLEX) 2 MG capsule Take 2 mg by mouth 3 (three) times daily.   umeclidinium-vilanterol (ANORO ELLIPTA) 62.5-25 MCG/INH AEPB Inhale 1 puff into the lungs daily.                            Past Medical History:  Diagnosis Date   Bradycardia  asympotmatic. Holter (2/11) with PACs, 1 run of 5 beats NSVT, HR range 41-125 with no pauses.    Cancer ([place])    gastric lymphoma   COPD (chronic obstructive pulmonary disease) (HCC)    ED (erectile dysfunction)    Gastric ulcer    GERD (gastroesophageal reflux disease)    Hiatal hernia    History of stomach cancer    surgery in 2001   HTN (hypertension)    Low testosterone    MR (mitral regurgitation)    echo 2/11: EF 60%, no regional wall abnormalities, mild MR, mild to moderate RV dialation, mild to moderate RV dysfunction    Osteopenia    Vertigo    Vertigo    Vitamin D deficiency       Objective:    04/20/2021      167   04/11/2020    169   02/27/20 167 lb 12.8 oz (76.1 kg)  01/09/20 167 lb (75.8 kg)  09/13/19 168 lb (76.2 kg)     Vital signs reviewed  04/20/2021  - Note at rest 02 sats  97% on RA   General appearance:    slt hoarse amb wm nad      HEENT : pt wearing mask not removed for exam due to covid - 19 concerns.    NECK :  without JVD/Nodes/TM/ nl carotid upstrokes bilaterally   LUNGS: no acc muscle use,  Mild barrel  contour chest wall with bilateral  Distant bs s audible wheeze and  without cough on insp or exp maneuvers  and mild  Hyperresonant  to  percussion bilaterally     CV:  RRR  no s3 or murmur or increase in P2, and no edema   ABD:  soft and nontender with pos end  insp Hoover's  in the supine position. No bruits or organomegaly appreciated, bowel sounds nl  MS:   Nl gait/  ext warm without deformities, calf tenderness, cyanosis or clubbing No obvious joint restrictions   SKIN: warm and dry  without lesions    NEURO:  alert, approp, nl sensorium with  no motor or cerebellar deficits apparent.                     Assessment

## 2021-04-21 ENCOUNTER — Encounter: Payer: Self-pay | Admitting: Internal Medicine

## 2021-04-21 NOTE — Assessment & Plan Note (Signed)
Rx since around  2018 [name] p pna   - 02/27/2020   Walked RA  approx   200 ft  @ avg pace  stopped due to desats to 88% at end of study though denies doe    - 04/20/2021   Walked on RA x  2  lap(s) =  approx 500 @ mod pace, stopped due to sob with sats down to 87%  with improvement to 94% on 2lpm  And completed another 250 ft s desats  Advised: Make sure you check your oxygen saturation  at your highest level of activity  to be sure it stays over 90% and adjust  02 flow upward to maintain this level if needed but remember to turn it back to previous settings when you stop (to conserve your supply).   Each maintenance medication was reviewed in detail including emphasizing most importantly the difference between maintenance and prns and under what circumstances the prns are to be triggered using an action plan format where appropriate.  Total time for H and P, chart review, counseling, reviewing dpi/02 device(s) , directly observing portions of ambulatory 02 saturation study/ and generating customized AVS unique to this office visit / same day charting =  25 min

## 2021-04-21 NOTE — Assessment & Plan Note (Signed)
Stopped smoking 2001 s resp symptoms, later told had emphysema - try off breo and ACEi as of 01/09/2020 > just use saba short term  - alpha one phenotype  02/27/20    MM   Level 134 - PFT's  03/18/20  FEV1 1.44 (48 % ) ratio 0.44  p 11 % improvement from saba p 0 prior to study with DLCO  8.72 (34%) corrects to 1.68 (43%)  for alv volume and FV curve classic concavity/ air trapping present   Pt is Group B in terms of symptom/risk and laba/lama therefore appropriate rx at this point >>>  Continue anoro

## 2021-04-21 NOTE — Assessment & Plan Note (Signed)
Try off acei and on ppi 01/09/2020 > only stopped acei as of 02/27/2020 > rec bid ppi x 6 week trial plus diet  - 04/20/2021 rec consider trial off fosamax and on reclast   Upper airway cough syndrome (previously labeled PNDS),  is so named because it's frequently impossible to sort out how much is  CR/sinusitis with freq throat clearing (which can be related to primary GERD)   vs  causing  secondary (" extra esophageal")  GERD from wide swings in gastric pressure that occur with throat clearing, often  promoting self use of mint and menthol lozenges that reduce the lower esophageal sphincter tone and exacerbate the problem further in a cyclical fashion.   These are the same pts (now being labeled as having "irritable larynx syndrome" by some cough centers) who not infrequently have a history of having failed to tolerate ace inhibitors,  dry powder inhalers or biphosphonates or report having atypical/extraesophageal reflux symptoms that don't respond to standard doses of PPI  and are easily confused as having aecopd or asthma flares by even experienced allergists/ pulmonologists (myself included).  He has 3 potential causes for cough and might consider trial off dpi but other option is try off fosamax since reclast available and already taking ppi to offset GI effects.     Advised to discuss with PCP and if not feasible to change to reclast and still coughing would try change fosamax to stiolto which I've found causes the least cough of all the inhalers in this category.  >>> f/u yearly, call sooner prn

## 2021-04-24 ENCOUNTER — Telehealth: Payer: Self-pay

## 2021-04-24 ENCOUNTER — Encounter (INDEPENDENT_AMBULATORY_CARE_PROVIDER_SITE_OTHER): Payer: Medicare Other | Admitting: Ophthalmology

## 2021-04-24 ENCOUNTER — Other Ambulatory Visit: Payer: Self-pay

## 2021-04-24 DIAGNOSIS — H43813 Vitreous degeneration, bilateral: Secondary | ICD-10-CM | POA: Diagnosis not present

## 2021-04-24 DIAGNOSIS — H353211 Exudative age-related macular degeneration, right eye, with active choroidal neovascularization: Secondary | ICD-10-CM | POA: Diagnosis not present

## 2021-04-24 DIAGNOSIS — H353122 Nonexudative age-related macular degeneration, left eye, intermediate dry stage: Secondary | ICD-10-CM

## 2021-04-24 NOTE — Telephone Encounter (Signed)
Caller name:[place], [employer] farm manager callback #:[phone]  Encourage patient to contact the pharmacy for refills or they can request refills through [facility]  (Please schedule appointment if patient has not been seen in over a year)  MEDICATION NAME & DOSE: Pt only wants telmisartan (MICARDIS) 40 MG tablet  through   Notes/Comments from patient:Pt does not want no other medication through [facility] please note to send all other medication to [facility] pt does not needs any medications at this time   [place]: [facility] ([place], [place] [facility]  [address], [place] [address]   Please notify patient: It takes 48-72 hours to process rx refill requests Ask patient to call pharmacy to ensure rx is ready before heading there.   (CLINICAL TO FILL OR ROUTE PER PROTOCOLS)

## 2021-04-27 ENCOUNTER — Other Ambulatory Visit: Payer: Self-pay

## 2021-04-27 DIAGNOSIS — I10 Essential (primary) hypertension: Secondary | ICD-10-CM

## 2021-04-27 MED ORDER — TELMISARTAN 40 MG PO TABS: 40.0000 mg | ORAL_TABLET | Freq: Every day | ORAL | 0 refills | Status: DC

## 2021-04-27 NOTE — Telephone Encounter (Signed)
Rx of Telmisartan 40 mg has been filled and sent to patient pharmacy.

## 2021-04-30 DIAGNOSIS — J449 Chronic obstructive pulmonary disease, unspecified: Secondary | ICD-10-CM | POA: Diagnosis not present

## 2021-05-15 ENCOUNTER — Other Ambulatory Visit: Payer: Self-pay | Admitting: Registered Nurse

## 2021-05-15 DIAGNOSIS — R42 Dizziness and giddiness: Secondary | ICD-10-CM

## 2021-05-15 NOTE — Telephone Encounter (Signed)
Patient is requesting a refill of the following medications: Requested Prescriptions   Pending Prescriptions Disp Refills   ondansetron (ZOFRAN) 4 MG tablet [Pharmacy Med Name: Ondansetron HCl 4 MG Oral Tablet] 12 tablet 0    Sig: TAKE 1/2 TO 1 (ONE-HALF TO ONE) TABLET BY MOUTH TWICE DAILY AS NEEDED    Date of patient request: 05/15/21 Last office visit: 03/03/21 Date of last refill: 04/06/21 Last refill amount: 12 tablets

## 2021-05-22 ENCOUNTER — Other Ambulatory Visit: Payer: Self-pay

## 2021-05-22 ENCOUNTER — Encounter (INDEPENDENT_AMBULATORY_CARE_PROVIDER_SITE_OTHER): Payer: Medicare Other | Admitting: Ophthalmology

## 2021-05-22 DIAGNOSIS — H43813 Vitreous degeneration, bilateral: Secondary | ICD-10-CM | POA: Diagnosis not present

## 2021-05-22 DIAGNOSIS — H353122 Nonexudative age-related macular degeneration, left eye, intermediate dry stage: Secondary | ICD-10-CM

## 2021-05-22 DIAGNOSIS — H353211 Exudative age-related macular degeneration, right eye, with active choroidal neovascularization: Secondary | ICD-10-CM | POA: Diagnosis not present

## 2021-05-23 ENCOUNTER — Other Ambulatory Visit: Payer: Self-pay | Admitting: Internal Medicine

## 2021-05-26 ENCOUNTER — Telehealth: Payer: Self-pay | Admitting: Internal Medicine

## 2021-05-26 MED ORDER — ANORO ELLIPTA 62.5-25 MCG/ACT IN AEPB: 1.0000 | INHALATION_SPRAY | Freq: Every day | RESPIRATORY_TRACT | 3 refills | Status: DC

## 2021-05-26 NOTE — Telephone Encounter (Signed)
Called and spoke with pt's spouse [name] letting her know that the Rx for pt's Anoro had been sent to preferred pharmacy. Stated to her that I was going to send message to Miller who works at the [employer] as that is where pt usually sees Dr. Melvyn Novas to see if they have any Anoro samples and she verbalized understanding.  Meagan, please advise if there are any Anoro samples and if so, please contact [name] for pt to let know. Thanks!

## 2021-05-26 NOTE — Telephone Encounter (Signed)
[place] office is currently out of Anoro samples. Rep was contacted last week to bring some more in. Called and talked to [name] and let her know that I will call as soon as we get them in and offer a sample to her when they are available. Will leave encounter open until patient is notified of sample restock.

## 2021-05-30 DIAGNOSIS — J449 Chronic obstructive pulmonary disease, unspecified: Secondary | ICD-10-CM | POA: Diagnosis not present

## 2021-06-02 ENCOUNTER — Other Ambulatory Visit: Payer: Self-pay

## 2021-06-02 ENCOUNTER — Encounter: Payer: Self-pay | Admitting: Registered Nurse

## 2021-06-02 ENCOUNTER — Ambulatory Visit (INDEPENDENT_AMBULATORY_CARE_PROVIDER_SITE_OTHER): Payer: Medicare Other | Admitting: Registered Nurse

## 2021-06-02 VITALS — BP 138/76 | HR 70 | Temp 98.2°F | Resp 18 | Ht 71.0 in | Wt 168.2 lb

## 2021-06-02 DIAGNOSIS — G8929 Other chronic pain: Secondary | ICD-10-CM | POA: Diagnosis not present

## 2021-06-02 DIAGNOSIS — M5440 Lumbago with sciatica, unspecified side: Secondary | ICD-10-CM

## 2021-06-02 DIAGNOSIS — I10 Essential (primary) hypertension: Secondary | ICD-10-CM

## 2021-06-02 MED ORDER — TIZANIDINE HCL 2 MG PO TABS: 2.0000 mg | ORAL_TABLET | Freq: Two times a day (BID) | ORAL | 0 refills | Status: AC | PRN

## 2021-06-02 NOTE — Patient Instructions (Addendum)
Mr. Patrick Novak -  Doristine Devoid to see you!  Blood pressure is good.  I have referred to PT and Pain management for an exploration of options. These groups will call you.  Use tizanidine once or twice daily as needed for pain. This may make you sleepy.  See you in 3 months  Thank you  Rich     If you have lab work done today you will be contacted with your lab results within the next 2 weeks.  If you have not heard from [place] then please contact us. The fastest way to get your results is to register for My Chart.   IF you received an x-ray today, you will receive an invoice from [facility]. Please contact [facility] at [phone] with questions or concerns regarding your invoice.   IF you received labwork today, you will receive an invoice from [facility]. Please contact [facility] at [phone] with questions or concerns regarding your invoice.   Our billing staff will not be able to assist you with questions regarding bills from these companies.  You will be contacted with the lab results as soon as they are available. The fastest way to get your results is to activate your My Chart account. Instructions are located on the last page of this paperwork. If you have not heard from [place] regarding the results in 2 weeks, please contact this office.

## 2021-06-02 NOTE — Progress Notes (Signed)
Established Patient Office Visit  Subjective:  Patient ID: Patrick Novak, male    DOB: 1940/08/21  Age: 80 y.o. MRN: [id number]  CC:  Chief Complaint  Patient presents with   Follow-up    Patient states he is here for a 3 month follow up and discuss sciatica.    HPI Patrick Novak presents for 3 mo follow up   Hypertension: Patient Currently taking: amlodipine 5mg  po qd, telmisartan 40mg  po qd Good effect. No AEs. Denies CV symptoms including: chest pain, shob, doe, headache, visual changes, fatigue, claudication, and dependent edema.   Previous readings and labs: BP Readings from Last 3 Encounters:  06/02/21 138/76  04/20/21 132/70  03/03/21 (!) 120/51   Lab Results  Component Value Date   CREATININE 1.12 03/03/2021    HLD Managed with lifestyle modification Lab Results  Component Value Date   CHOL 134 03/03/2021   HDL 38.00 (L) 03/03/2021   LDLCALC 69 03/03/2021   TRIG 138.0 03/03/2021   CHOLHDL 4 03/03/2021   Sciatica Ongoing. Had been on meloxicam in the past with limited relief.  At last visit we had given IM depo medrol for pain. Some relief, but limited. Interested in options.   Past Medical History:  Diagnosis Date   Bradycardia    asympotmatic. Holter (2/11) with PACs, 1 run of 5 beats NSVT, HR range 41-125 with no pauses.    Cancer ([place])    gastric lymphoma   COPD (chronic obstructive pulmonary disease) (HCC)    ED (erectile dysfunction)    Gastric ulcer    GERD (gastroesophageal reflux disease)    Hiatal hernia    History of stomach cancer    surgery in 2001   HTN (hypertension)    Low testosterone    MR (mitral regurgitation)    echo 2/11: EF 60%, no regional wall abnormalities, mild MR, mild to moderate RV dialation, mild to moderate RV dysfunction    Osteopenia    Vertigo    Vertigo    Vitamin D deficiency     Past Surgical History:  Procedure Laterality Date   CHOLECYSTECTOMY  1995   radical subtotal gastrectomy  09/1999     Family History  Problem Relation Age of Onset   Stroke Father    Heart disease Mother    Stroke Brother    Hip fracture Sister    Cancer Sister        breast   Heart disease Sister    Hepatitis Sister    Valvular heart disease Sister    Alzheimer's disease Sister    Lung disease Sister    Early death Brother     Social History   Socioeconomic History   Marital status: Married    Spouse name: [name]   Number of children: 4   Years of education: 11   Highest education level: GED or equivalent  Occupational History   Occupation: retired  Tobacco Use   Smoking status: Former    Packs/day: 1.50    Years: 47.00    Pack years: 70.50    Types: Cigarettes    Quit date: 08/21/1999    Years since quitting: 21.7   Smokeless tobacco: Never   Tobacco comments:    quit in 2000  Vaping Use   Vaping Use: Never used  Substance and Sexual Activity   Alcohol use: No   Drug use: No   Sexual activity: Yes    Birth control/protection: None  Other Topics Concern  Not on file  Social History Narrative   Married, lives in [place].   Retired Psychologist, sport and exercise, does some carpentry.    Social Determinants of Health   Financial Resource Strain: Not on file  Food Insecurity: Not on file  Transportation Needs: Not on file  Physical Activity: Not on file  Stress: Not on file  Social Connections: Not on file  Intimate Partner Violence: Not on file    Outpatient Medications Prior to Visit  Medication Sig Dispense Refill   alendronate (FOSAMAX) 70 MG tablet Take 70 mg by mouth once a week.     amLODipine (NORVASC) 5 MG tablet Take 5 mg by mouth daily.     BESIVANCE 0.6 % SUSP Apply to eye.     Calcium Carbonate-Vitamin D 600-400 MG-UNIT per tablet Take 1 tablet by mouth daily.      levothyroxine (SYNTHROID) 25 MCG tablet      meclizine (ANTIVERT) 12.5 MG tablet Take by mouth.     meclizine (ANTIVERT) 12.5 MG tablet Take 1 tablet (12.5 mg total) by mouth 2 (two) times daily as needed.  180 tablet 0   meloxicam (MOBIC) 15 MG tablet Take 1 tablet (15 mg total) by mouth daily. 30 tablet 2   Multiple Vitamin (MULTIVITAMIN) tablet Take 1 tablet by mouth daily.       Multiple Vitamins-Minerals (PRESERVISION AREDS PO) Take by mouth.     omeprazole (PRILOSEC) 20 MG capsule TAKE 1 CAPSULE BY MOUTH 30-60 MINUTES BEFORE FIRST AND LAST MEALS OF THE DAY 180 capsule 6   ondansetron (ZOFRAN) 4 MG tablet Take by mouth.     ondansetron (ZOFRAN) 4 MG tablet TAKE 1/2 TO 1 (ONE-HALF TO ONE) TABLET BY MOUTH TWICE DAILY AS NEEDED 12 tablet 0   telmisartan (MICARDIS) 40 MG tablet Take 1 tablet (40 mg total) by mouth daily. 90 tablet 0   Testosterone 1.62 % GEL APPLY 4 PUMPS TOPICALLY ONCE DAILY TO SHOULDER AREA 75 g 0   umeclidinium-vilanterol (ANORO ELLIPTA) 62.5-25 MCG/ACT AEPB Inhale 1 puff into the lungs daily. 180 each 3   tizanidine (ZANAFLEX) 2 MG capsule Take 2 mg by mouth 3 (three) times daily.     No facility-administered medications prior to visit.    Allergies  Allergen Reactions   Codeine Nausea And Vomiting and Other (See Comments)    disoriented   Hydrocodone-Acetaminophen Nausea And Vomiting   Ciprofloxacin Nausea Only   Doxycycline Nausea And Vomiting   Sulfa Antibiotics Nausea And Vomiting    Nausea    Zithromax [Azithromycin]     abd pain     ROS Review of Systems  Constitutional: Negative.   HENT: Negative.    Eyes: Negative.   Respiratory: Negative.    Cardiovascular: Negative.   Gastrointestinal: Negative.   Genitourinary: Negative.   Musculoskeletal:  Positive for back pain. Negative for arthralgias, gait problem, joint swelling, myalgias, neck pain and neck stiffness.  Skin: Negative.   Neurological: Negative.   Psychiatric/Behavioral: Negative.    All other systems reviewed and are negative.    Objective:    Physical Exam Constitutional:      General: He is not in acute distress.    Appearance: Normal appearance. He is normal weight. He is not  ill-appearing, toxic-appearing or diaphoretic.  Cardiovascular:     Rate and Rhythm: Normal rate and regular rhythm.     Heart sounds: Normal heart sounds. No murmur heard.   No friction rub. No gallop.  Pulmonary:  Effort: Pulmonary effort is normal. No respiratory distress.     Breath sounds: Normal breath sounds. No stridor. No wheezing, rhonchi or rales.  Chest:     Chest wall: No tenderness.  Musculoskeletal:        General: Tenderness (lower right back) present. No swelling, deformity or signs of injury. Normal range of motion.     Right lower leg: No edema.     Left lower leg: No edema.     Comments: Positive SLR right  Neurological:     General: No focal deficit present.     Mental Status: He is alert and oriented to person, place, and time. Mental status is at baseline.  Psychiatric:        Mood and Affect: Mood normal.        Behavior: Behavior normal.        Thought Content: Thought content normal.        Judgment: Judgment normal.    BP 138/76    Pulse 70    Temp 98.2 F (36.8 C) (Temporal)    Resp 18    Ht 5\' 11"  (1.803 m)    Wt 168 lb 3.2 oz (76.3 kg)    SpO2 99%    BMI 23.46 kg/m  Wt Readings from Last 3 Encounters:  06/02/21 168 lb 3.2 oz (76.3 kg)  04/20/21 167 lb (75.8 kg)  03/03/21 166 lb 6.4 oz (75.5 kg)     Health Maintenance Due  Topic Date Due   COVID-19 Vaccine (4 - Booster for Moderna series) 09/11/2020    There are no preventive care reminders to display for this patient.  Lab Results  Component Value Date   TSH 3.60 03/03/2021   Lab Results  Component Value Date   WBC 7.4 03/03/2021   HGB 14.0 03/03/2021   HCT 42.5 03/03/2021   MCV 95.3 03/03/2021   PLT 290.0 03/03/2021   Lab Results  Component Value Date   NA 139 03/03/2021   K 4.3 03/03/2021   CO2 31 03/03/2021   GLUCOSE 91 03/03/2021   BUN 18 03/03/2021   CREATININE 1.12 03/03/2021   BILITOT 0.8 03/03/2021   ALKPHOS 124 (H) 03/03/2021   AST 19 03/03/2021   ALT 23  03/03/2021   PROT 6.1 03/03/2021   ALBUMIN 3.8 03/03/2021   CALCIUM 9.4 03/03/2021   ANIONGAP 8 02/27/2020   GFR 61.71 03/03/2021   Lab Results  Component Value Date   CHOL 134 03/03/2021   Lab Results  Component Value Date   HDL 38.00 (L) 03/03/2021   Lab Results  Component Value Date   LDLCALC 69 03/03/2021   Lab Results  Component Value Date   TRIG 138.0 03/03/2021   Lab Results  Component Value Date   CHOLHDL 4 03/03/2021   Lab Results  Component Value Date   HGBA1C 5.6 03/03/2021      Assessment & Plan:   Problem List Items Addressed This Visit       Cardiovascular and Mediastinum   Essential hypertension   Other Visit Diagnoses     Chronic bilateral low back pain with sciatica, sciatica laterality unspecified    -  Primary   Relevant Medications   tiZANidine (ZANAFLEX) 2 MG tablet   Other Relevant Orders   Ambulatory referral to Physical Therapy   Ambulatory referral to Pain Clinic       Meds ordered this encounter  Medications   tiZANidine (ZANAFLEX) 2 MG tablet    Sig: Take 1  tablet (2 mg total) by mouth 2 (two) times daily as needed for muscle spasms.    Dispense:  30 tablet    Refill:  0    Order Specific Question:   Supervising Provider    Answer:   Carlota Raspberry, JEFFREY R [[id number]]    Follow-up: Return in about 3 months (around 08/31/2021) for htn, pain.   PLAN Refer to pain management and PT. Pt interested in PT, accupuncture, massage, but would be amenable to injections if warranted. BP well controlled. Continue current regimen Return in 3 mo for labs and recheck Patient encouraged to call clinic with any questions, comments, or concerns.  Maximiano Coss, NP

## 2021-06-02 NOTE — Telephone Encounter (Signed)
ATC pt. To let them know we have received anoro samples. LMTCB if interested in getting one.

## 2021-06-03 ENCOUNTER — Other Ambulatory Visit: Payer: Self-pay

## 2021-06-03 NOTE — Telephone Encounter (Signed)
Patient returned call and is going to come by to pick up Anoro samples today.   LOT [id number] NDIC [phone] [id number]

## 2021-06-17 ENCOUNTER — Other Ambulatory Visit: Payer: Self-pay

## 2021-06-17 ENCOUNTER — Encounter: Payer: Self-pay | Admitting: Physical Therapy

## 2021-06-17 ENCOUNTER — Ambulatory Visit: Payer: Medicare Other | Attending: Registered Nurse | Admitting: Physical Therapy

## 2021-06-17 DIAGNOSIS — M5440 Lumbago with sciatica, unspecified side: Secondary | ICD-10-CM | POA: Insufficient documentation

## 2021-06-17 DIAGNOSIS — G8929 Other chronic pain: Secondary | ICD-10-CM | POA: Insufficient documentation

## 2021-06-17 DIAGNOSIS — R293 Abnormal posture: Secondary | ICD-10-CM | POA: Insufficient documentation

## 2021-06-17 DIAGNOSIS — M545 Low back pain, unspecified: Secondary | ICD-10-CM | POA: Diagnosis not present

## 2021-06-17 NOTE — Therapy (Addendum)
[facility] [place], [place], [address] Phone: [phone]   Fax:  [phone]  Physical Therapy Evaluation  Patient Details  Name: Patrick Novak MRN: [id number] Date of Birth: 12-21-1940 Referring Provider (PT): Maximiano Coss   Encounter Date: 06/17/2021   PT End of Session - 06/17/21 1123     Visit Number 1    Number of Visits 12    Date for PT Re-Evaluation 09/15/21    Authorization Type FOTO AT LEAST EVERY 5TH VISIT.  PROGRESS NOTE AT 10TH VISIT.  KX MODIFIER AFTER 15 VISITS.    PT Start Time 1030    PT Stop Time 1115    PT Time Calculation (min) 45 min    Activity Tolerance Patient tolerated treatment well    Behavior During Therapy WFL for tasks assessed/performed             Past Medical History:  Diagnosis Date   Bradycardia    asympotmatic. Holter (2/11) with PACs, 1 run of 5 beats NSVT, HR range 41-125 with no pauses.    Cancer ([place])    gastric lymphoma   COPD (chronic obstructive pulmonary disease) (HCC)    ED (erectile dysfunction)    Gastric ulcer    GERD (gastroesophageal reflux disease)    Hiatal hernia    History of stomach cancer    surgery in 2001   HTN (hypertension)    Low testosterone    MR (mitral regurgitation)    echo 2/11: EF 60%, no regional wall abnormalities, mild MR, mild to moderate RV dialation, mild to moderate RV dysfunction    Osteopenia    Vertigo    Vertigo    Vitamin D deficiency     Past Surgical History:  Procedure Laterality Date   CHOLECYSTECTOMY  1995   radical subtotal gastrectomy  09/1999    There were no vitals filed for this visit.    Subjective Assessment - 06/17/21 1127     Subjective COVID-19 screen performed prior to patient entering clinic.  The patient presents to the clinic today with c/o right-sided low back with radiation into his right buttock.  She states this came on about 11 months ago when he turned in bed.  The pain was excuriating at  the time.  His pain-level is a 4/10 at this time but can rise to higher levels with increased activity.  He enjoys Golf but it hurts his back.  Sitting decreases his pain.    Pertinent History HTN, COPD, hiatal hernia, osteopenia.    How long can you sit comfortably? Unlimited.    How long can you stand comfortably? Varies.    How long can you walk comfortably? Pain tends to decrease with walking.    Patient Stated Goals Perform ADL's and Golf without pain.    Currently in Pain? Yes    Pain Score 4     Pain Location Back    Pain Orientation Right    Pain Descriptors / Indicators Aching;Sore    Pain Type Chronic pain    Pain Onset More than a month ago    Pain Frequency Constant    Aggravating Factors  See above.    Pain Relieving Factors See above.                [facility] PT Assessment - 06/17/21 0001       Assessment   Medical Diagnosis Chronic bilateral low back pain with Sciatica.    Referring Provider (PT) Delfino Lovett  [place]    Onset Date/Surgical Date --   Per patient ~ 11 months.     Precautions   Precautions None      Restrictions   Weight Bearing Restrictions No      Balance Screen   Has the patient fallen in the past 6 months No    Has the patient had a decrease in activity level because of a fear of falling?  No    Is the patient reluctant to leave their home because of a fear of falling?  No      Home Environment   Living Environment Private residence      Prior Function   Level of Independence Independent      Observation/Other Assessments   Focus on Therapeutic Outcomes (FOTO)  Complete.      Posture/Postural Control   Posture/Postural Control Postural limitations    Postural Limitations Rounded Shoulders;Forward head;Decreased lumbar lordosis;Posterior pelvic tilt      Deep Tendon Reflexes   DTR Assessment Site Patella;Achilles    Patella DTR 2+    Achilles DTR 2+      ROM / Strength   AROM / PROM / Strength AROM;Strength      AROM   Overall  AROM Comments Active lumbar extension limited to 15 degrees and painful.  Active lumbar flexion limited by 25%.      Strength   Overall Strength Comments Normal LE strength.      Palpation   Palpation comment Tender to palpation over patient's right SIJ but especially over the right upper gluteal musculature and Piriformis region.      Special Tests   Other special tests Equal leg lengths.  (+) right FABER test.  "Stretching" with bilateral SLR testing.  No pain reproduction with a left hip Scour test.      Ambulation/Gait   Gait Comments WNL.                        Objective measurements completed on examination: See above findings.       [facility] Adult PT Treatment/Exercise - 06/17/21 0001       Modalities   Modalities Electrical Stimulation;Moist Heat      Moist Heat Therapy   Number Minutes Moist Heat 20 Minutes    Moist Heat Location Lumbar Spine      Electrical Stimulation   Electrical Stimulation Location Right low back/upper gluteal musculature.    Electrical Stimulation Action IFC at 80-150 Hz.    Electrical Stimulation Parameters 40% scan x 20 minutes.    Electrical Stimulation Goals Tone;Pain                          PT Long Term Goals - 06/17/21 1151       PT LONG TERM GOAL #1   Title Independent with a HEP.    Time 6    Period Weeks    Status New      PT LONG TERM GOAL #2   Title Perform ADL's with pain not > 3/10.    Time 6    Period Weeks    Status New      PT LONG TERM GOAL #3   Title Sleep 6 hours undisturbed.    Time 6    Period Weeks    Status New                    Plan - 06/17/21  1145     Clinical Impression Statement The patient presents to OPPT with c/o ongoing right-sided low back pain with radition into his right buttock.  He was found to be tender to palpation over his right SIJ and especially over the right upper gluteal and Piriformis musculature.  He has pain reproduction with lumbar  extension and demonstrates a positive right FABER test.  His postural is remarkable for a posterior pelvic tlit and loss of lumbar lordosis.  He enjoys Golf but his pain prohibits him from engaging this activity like he would want to. He has pain while sleeping.  We discussed sleeping with pillows betwen his knees as he tends to be a side sleeper.  Patient will benefit from skilled physical therapy intervention to address pain and deficits.    Personal Factors and Comorbidities Comorbidity 1;Other    Comorbidities HTN, COPD, hiatal hernia, osteopenia.    Examination-Activity Limitations Other;Sleep    Examination-Participation Restrictions Other    Stability/Clinical Decision Making Evolving/Moderate complexity    Clinical Decision Making Low    Rehab Potential Excellent    PT Frequency 2x / week    PT Duration 6 weeks    PT Treatment/Interventions ADLs/Self Care Home Management;Cryotherapy;Electrical Stimulation;Ultrasound;Moist Heat;Therapeutic activities;Therapeutic exercise;Manual techniques;Patient/family education;Passive range of motion    PT Next Visit Plan Combo e'stim/US f/b STW/M, S and DKTC, hip bridges, core exercise progression.    Consulted and Agree with Plan of Care Patient             Patient will benefit from skilled therapeutic intervention in order to improve the following deficits and impairments:  Decreased activity tolerance, Decreased range of motion, Postural dysfunction, Increased muscle spasms, Pain  Visit Diagnosis: Chronic right-sided low back pain without sciatica - Plan: PT plan of care cert/re-cert  Abnormal posture - Plan: PT plan of care cert/re-cert     Problem List Patient Active Problem List   Diagnosis Date Noted   Chronic respiratory failure with hypoxia ([place]) 02/27/2020   Upper airway cough syndrome 01/10/2020   Gastroesophageal reflux disease without esophagitis 11/03/2018   COPD GOLD 3 / min reversibility     Osteopenia 08/20/2012   Low  testosterone 08/20/2012   Impotence of organic origin 08/20/2012   Hiatal hernia 08/20/2012   Essential hypertension 07/21/2009   Bradycardia 07/21/2009    Tyland Klemens, [place], PT 06/17/2021, 2:59 PM  [facility] [address] [place], [place], [address] Phone: [phone]   Fax:  [phone]  Name: NAYDEN CZAJKA MRN: [id number] Date of Birth: Oct 21, 1940

## 2021-06-19 ENCOUNTER — Other Ambulatory Visit: Payer: Self-pay | Admitting: Registered Nurse

## 2021-06-19 DIAGNOSIS — R42 Dizziness and giddiness: Secondary | ICD-10-CM

## 2021-06-26 ENCOUNTER — Encounter (INDEPENDENT_AMBULATORY_CARE_PROVIDER_SITE_OTHER): Payer: Medicare Other | Admitting: Ophthalmology

## 2021-06-26 ENCOUNTER — Other Ambulatory Visit: Payer: Self-pay

## 2021-06-26 DIAGNOSIS — H353122 Nonexudative age-related macular degeneration, left eye, intermediate dry stage: Secondary | ICD-10-CM

## 2021-06-26 DIAGNOSIS — H353211 Exudative age-related macular degeneration, right eye, with active choroidal neovascularization: Secondary | ICD-10-CM | POA: Diagnosis not present

## 2021-06-26 DIAGNOSIS — H43813 Vitreous degeneration, bilateral: Secondary | ICD-10-CM

## 2021-06-30 DIAGNOSIS — J449 Chronic obstructive pulmonary disease, unspecified: Secondary | ICD-10-CM | POA: Diagnosis not present

## 2021-07-31 ENCOUNTER — Encounter (INDEPENDENT_AMBULATORY_CARE_PROVIDER_SITE_OTHER): Payer: Medicare Other | Admitting: Ophthalmology

## 2021-07-31 ENCOUNTER — Other Ambulatory Visit: Payer: Self-pay

## 2021-07-31 DIAGNOSIS — H353211 Exudative age-related macular degeneration, right eye, with active choroidal neovascularization: Secondary | ICD-10-CM

## 2021-07-31 DIAGNOSIS — H353122 Nonexudative age-related macular degeneration, left eye, intermediate dry stage: Secondary | ICD-10-CM

## 2021-07-31 DIAGNOSIS — H43813 Vitreous degeneration, bilateral: Secondary | ICD-10-CM | POA: Diagnosis not present

## 2021-07-31 DIAGNOSIS — J449 Chronic obstructive pulmonary disease, unspecified: Secondary | ICD-10-CM | POA: Diagnosis not present

## 2021-08-28 DIAGNOSIS — J449 Chronic obstructive pulmonary disease, unspecified: Secondary | ICD-10-CM | POA: Diagnosis not present

## 2021-09-01 ENCOUNTER — Ambulatory Visit: Payer: Medicare Other | Admitting: Registered Nurse

## 2021-09-07 ENCOUNTER — Ambulatory Visit (INDEPENDENT_AMBULATORY_CARE_PROVIDER_SITE_OTHER): Payer: Medicare Other | Admitting: Registered Nurse

## 2021-09-07 ENCOUNTER — Encounter: Payer: Self-pay | Admitting: Registered Nurse

## 2021-09-07 VITALS — BP 136/74 | HR 79 | Temp 98.0°F | Resp 18 | Ht 71.0 in | Wt 166.6 lb

## 2021-09-07 DIAGNOSIS — R42 Dizziness and giddiness: Secondary | ICD-10-CM | POA: Diagnosis not present

## 2021-09-07 DIAGNOSIS — M85859 Other specified disorders of bone density and structure, unspecified thigh: Secondary | ICD-10-CM

## 2021-09-07 DIAGNOSIS — I10 Essential (primary) hypertension: Secondary | ICD-10-CM | POA: Diagnosis not present

## 2021-09-07 DIAGNOSIS — R7989 Other specified abnormal findings of blood chemistry: Secondary | ICD-10-CM

## 2021-09-07 LAB — COMPREHENSIVE METABOLIC PANEL
ALT: 14 U/L (ref 0–53)
AST: 18 U/L (ref 0–37)
Albumin: 4.1 g/dL (ref 3.5–5.2)
Alkaline Phosphatase: 113 U/L (ref 39–117)
BUN: 19 mg/dL (ref 6–23)
CO2: 31 mEq/L (ref 19–32)
Calcium: 9.4 mg/dL (ref 8.4–10.5)
Chloride: 103 mEq/L (ref 96–112)
Creatinine, Ser: 1.14 mg/dL (ref 0.40–1.50)
GFR: 60.2 mL/min (ref >60.00)
Glucose, Bld: 80 mg/dL (ref 70–99)
Potassium: 4.3 mEq/L (ref 3.5–5.1)
Sodium: 141 mEq/L (ref 135–145)
Total Bilirubin: 0.7 mg/dL (ref 0.2–1.2)
Total Protein: 6.3 g/dL (ref 6.0–8.3)

## 2021-09-07 LAB — LIPID PANEL
Cholesterol: 147 mg/dL (ref 0–200)
HDL: 38.6 mg/dL — ABNORMAL LOW (ref >39.00)
LDL Cholesterol: 73 mg/dL (ref 0–99)
NonHDL: 108.11
Total CHOL/HDL Ratio: 4
Triglycerides: 174 mg/dL — ABNORMAL HIGH (ref 0.0–149.0)
VLDL: 34.8 mg/dL (ref 0.0–40.0)

## 2021-09-07 LAB — TESTOSTERONE: Testosterone: 212.08 ng/dL — ABNORMAL LOW (ref 300.00–890.00)

## 2021-09-07 MED ORDER — ALENDRONATE SODIUM 70 MG PO TABS: 70.0000 mg | ORAL_TABLET | ORAL | 0 refills | Status: AC

## 2021-09-07 MED ORDER — TELMISARTAN 40 MG PO TABS: 40.0000 mg | ORAL_TABLET | Freq: Every day | ORAL | 1 refills | Status: AC

## 2021-09-07 MED ORDER — ONDANSETRON HCL 4 MG PO TABS: 4.0000 mg | ORAL_TABLET | Freq: Three times a day (TID) | ORAL | 0 refills | Status: AC | PRN

## 2021-09-07 NOTE — Addendum Note (Signed)
Addended by: Maximiano Coss on: 09/07/2021 09:21 AM ? ? Modules accepted: Orders ? ?

## 2021-09-07 NOTE — Progress Notes (Signed)
? ?Established Patient Office Visit ? ?Subjective:  ?Patient ID: Patrick Novak, male    DOB: 1940/10/10  Age: 81 y.o. MRN: [id number] ? ?CC:  ?Chief Complaint  ?Patient presents with  ? Follow-up  ?  Patient states he is here for follow up and medication refill.  ? ? ?HPI ?Patrick Novak presents for htn ? ?Hypertension: ?Patient Currently taking: telmisartan '40mg'$  po qd ?Good effect. No AEs. ?Denies CV symptoms including: chest pain, shob, doe, headache, visual changes, fatigue, claudication, and dependent edema.  ? ?Previous readings and labs: ?BP Readings from Last 3 Encounters:  ?09/07/21 136/74  ?06/02/21 138/76  ?04/20/21 132/70  ? ?Lab Results  ?Component Value Date  ? CREATININE 1.12 03/03/2021  ? ? ?Osteopenia ?Takes fosamax weekly.  ?Has been on since Jan 2022.  ?No AE. ?Osteopenia in femoral neck bilat. ? ?Nausea without vomiting. ?Zofran '4mg'$  po tid prn ?Good effect, no AE ?Intermittent use.  ?Past Medical History:  ?Diagnosis Date  ? Bradycardia   ? asympotmatic. Holter (2/11) with PACs, 1 run of 5 beats NSVT, HR range 41-125 with no pauses.   ? Cancer ([place])   ? gastric lymphoma  ? COPD (chronic obstructive pulmonary disease) ([facility])   ? ED (erectile dysfunction)   ? Gastric ulcer   ? GERD (gastroesophageal reflux disease)   ? Hiatal hernia   ? History of stomach cancer   ? surgery in 2001  ? HTN (hypertension)   ? Low testosterone   ? MR (mitral regurgitation)   ? echo 2/11: EF 60%, no regional wall abnormalities, mild MR, mild to moderate RV dialation, mild to moderate RV dysfunction   ? Osteopenia   ? Vertigo   ? Vertigo   ? Vitamin D deficiency   ? ? ?Past Surgical History:  ?Procedure Laterality Date  ? CHOLECYSTECTOMY  1995  ? radical subtotal gastrectomy  09/1999  ? ? ?Family History  ?Problem Relation Age of Onset  ? Stroke Father   ? Heart disease Mother   ? Stroke Brother   ? Hip fracture Sister   ? Cancer Sister   ?     breast  ? Heart disease Sister   ? Hepatitis Sister   ? Valvular heart disease  Sister   ? Alzheimer's disease Sister   ? Lung disease Sister   ? Early death Brother   ? ? ?Social History  ? ?Socioeconomic History  ? Marital status: Married  ?  Spouse name: Patrick Novak  ? Number of children: 4  ? Years of education: 47  ? Highest education level: GED or equivalent  ?Occupational History  ? Occupation: retired  ?Tobacco Use  ? Smoking status: Former  ?  Packs/day: 1.50  ?  Years: 47.00  ?  Pack years: 70.50  ?  Types: Cigarettes  ?  Quit date: 08/21/1999  ?  Years since quitting: 22.0  ? Smokeless tobacco: Never  ? Tobacco comments:  ?  quit in 2000  ?Vaping Use  ? Vaping Use: Never used  ?Substance and Sexual Activity  ? Alcohol use: No  ? Drug use: No  ? Sexual activity: Yes  ?  Birth control/protection: None  ?Other Topics Concern  ? Not on file  ?Social History Narrative  ? Married, lives in [place].  ? Retired Psychologist, sport and exercise, does some carpentry.   ? ?Social Determinants of Health  ? ?Financial Resource Strain: Not on file  ?Food Insecurity: Not on file  ?Transportation Needs: Not on file  ?  Physical Activity: Not on file  ?Stress: Not on file  ?Social Connections: Not on file  ?Intimate Partner Violence: Not on file  ? ? ?Outpatient Medications Prior to Visit  ?Medication Sig Dispense Refill  ? BESIVANCE 0.6 % SUSP Apply to eye.    ? Calcium Carbonate-Vitamin D 600-400 MG-UNIT per tablet Take 1 tablet by mouth daily.     ? levothyroxine (SYNTHROID) 25 MCG tablet     ? meclizine (ANTIVERT) 12.5 MG tablet Take by mouth.    ? meclizine (ANTIVERT) 12.5 MG tablet Take 1 tablet (12.5 mg total) by mouth 2 (two) times daily as needed. 180 tablet 0  ? meloxicam (MOBIC) 15 MG tablet Take 1 tablet (15 mg total) by mouth daily. 30 tablet 2  ? Multiple Vitamin (MULTIVITAMIN) tablet Take 1 tablet by mouth daily.      ? Multiple Vitamins-Minerals (PRESERVISION AREDS PO) Take by mouth.    ? omeprazole (PRILOSEC) 20 MG capsule TAKE 1 CAPSULE BY MOUTH 30-60 MINUTES BEFORE FIRST AND LAST MEALS OF THE DAY 180 capsule 6  ?  ondansetron (ZOFRAN) 4 MG tablet Take by mouth.    ? Testosterone 1.62 % GEL APPLY 4 PUMPS TOPICALLY ONCE DAILY TO SHOULDER AREA 75 g 0  ? tiZANidine (ZANAFLEX) 2 MG tablet Take 1 tablet (2 mg total) by mouth 2 (two) times daily as needed for muscle spasms. 30 tablet 0  ? umeclidinium-vilanterol (ANORO ELLIPTA) 62.5-25 MCG/ACT AEPB Inhale 1 puff into the lungs daily. 180 each 3  ? alendronate (FOSAMAX) 70 MG tablet Take 70 mg by mouth once a week.    ? ondansetron (ZOFRAN) 4 MG tablet TAKE 1/2 TO 1 (ONE-HALF TO ONE) TABLET BY MOUTH TWICE DAILY AS NEEDED 12 tablet 0  ? telmisartan (MICARDIS) 40 MG tablet Take 1 tablet (40 mg total) by mouth daily. 90 tablet 0  ? amLODipine (NORVASC) 5 MG tablet Take 5 mg by mouth daily. (Patient not taking: Reported on 06/17/2021)    ? ?No facility-administered medications prior to visit.  ? ? ?Allergies  ?Allergen Reactions  ? Codeine Nausea And Vomiting and Other (See Comments)  ?  disoriented  ? Hydrocodone-Acetaminophen Nausea And Vomiting  ? Ciprofloxacin Nausea Only  ? Doxycycline Nausea And Vomiting  ? Sulfa Antibiotics Nausea And Vomiting  ?  Nausea   ? Zithromax [Azithromycin]   ?  abd pain ?  ? ? ?ROS ?Review of Systems  ?Constitutional: Negative.   ?HENT: Negative.    ?Eyes: Negative.   ?Respiratory: Negative.    ?Cardiovascular: Negative.   ?Gastrointestinal: Negative.   ?Genitourinary: Negative.   ?Musculoskeletal: Negative.   ?Skin: Negative.   ?Neurological: Negative.   ?Psychiatric/Behavioral: Negative.    ?All other systems reviewed and are negative. ? ?  ?Objective:  ?  ?Physical Exam ?Constitutional:   ?   General: He is not in acute distress. ?   Appearance: Normal appearance. He is normal weight. He is not ill-appearing, toxic-appearing or diaphoretic.  ?Cardiovascular:  ?   Rate and Rhythm: Normal rate and regular rhythm.  ?   Heart sounds: Normal heart sounds. No murmur heard. ?  No friction rub. No gallop.  ?Pulmonary:  ?   Effort: Pulmonary effort is normal.  No respiratory distress.  ?   Breath sounds: Normal breath sounds. No stridor. No wheezing, rhonchi or rales.  ?Chest:  ?   Chest wall: No tenderness.  ?Neurological:  ?   General: No focal deficit present.  ?   Mental  Status: He is alert and oriented to person, place, and time. Mental status is at baseline.  ?Psychiatric:     ?   Mood and Affect: Mood normal.     ?   Behavior: Behavior normal.     ?   Thought Content: Thought content normal.     ?   Judgment: Judgment normal.  ? ? ?BP 136/74   Pulse 79   Temp 98 ?F (36.7 ?C) (Temporal)   Resp 18   Ht '5\' 11"'$  (1.803 m)   Wt 166 lb 9.6 oz (75.6 kg)   SpO2 94%   BMI 23.24 kg/m?  ?Wt Readings from Last 3 Encounters:  ?09/07/21 166 lb 9.6 oz (75.6 kg)  ?06/02/21 168 lb 3.2 oz (76.3 kg)  ?04/20/21 167 lb (75.8 kg)  ? ? ? ?Health Maintenance Due  ?Topic Date Due  ? Zoster Vaccines- Shingrix (1 of 2) Never done  ? COVID-19 Vaccine (4 - Booster for Moderna series) 09/11/2020  ? ? ?There are no preventive care reminders to display for this patient. ? ?Lab Results  ?Component Value Date  ? TSH 3.60 03/03/2021  ? ?Lab Results  ?Component Value Date  ? WBC 7.4 03/03/2021  ? HGB 14.0 03/03/2021  ? HCT 42.5 03/03/2021  ? MCV 95.3 03/03/2021  ? PLT 290.0 03/03/2021  ? ?Lab Results  ?Component Value Date  ? NA 139 03/03/2021  ? K 4.3 03/03/2021  ? CO2 31 03/03/2021  ? GLUCOSE 91 03/03/2021  ? BUN 18 03/03/2021  ? CREATININE 1.12 03/03/2021  ? BILITOT 0.8 03/03/2021  ? ALKPHOS 124 (H) 03/03/2021  ? AST 19 03/03/2021  ? ALT 23 03/03/2021  ? PROT 6.1 03/03/2021  ? ALBUMIN 3.8 03/03/2021  ? CALCIUM 9.4 03/03/2021  ? ANIONGAP 8 02/27/2020  ? GFR 61.71 03/03/2021  ? ?Lab Results  ?Component Value Date  ? CHOL 134 03/03/2021  ? ?Lab Results  ?Component Value Date  ? HDL 38.00 (L) 03/03/2021  ? ?Lab Results  ?Component Value Date  ? [place] 69 03/03/2021  ? ?Lab Results  ?Component Value Date  ? TRIG 138.0 03/03/2021  ? ?Lab Results  ?Component Value Date  ? CHOLHDL 4 03/03/2021   ? ?Lab Results  ?Component Value Date  ? HGBA1C 5.6 03/03/2021  ? ? ?  ?Assessment & Plan:  ? ?Problem List Items Addressed This Visit   ? ?  ? Cardiovascular and Mediastinum  ? Essential hypertension  ? [place]

## 2021-09-07 NOTE — Patient Instructions (Signed)
Mr. Kratochvil -  ? ?Keep on keepin' on! BP looks great. ? ?Alendronate and odansetron refilled ? ?See you in 6 mo to recheck ? ?Thanks, ? ?Rich  ?

## 2021-09-11 ENCOUNTER — Encounter (INDEPENDENT_AMBULATORY_CARE_PROVIDER_SITE_OTHER): Payer: Medicare Other | Admitting: Ophthalmology

## 2021-09-11 ENCOUNTER — Other Ambulatory Visit: Payer: Self-pay

## 2021-09-11 DIAGNOSIS — H353211 Exudative age-related macular degeneration, right eye, with active choroidal neovascularization: Secondary | ICD-10-CM

## 2021-09-11 DIAGNOSIS — H43813 Vitreous degeneration, bilateral: Secondary | ICD-10-CM

## 2021-09-11 DIAGNOSIS — H353122 Nonexudative age-related macular degeneration, left eye, intermediate dry stage: Secondary | ICD-10-CM

## 2021-09-16 ENCOUNTER — Telehealth: Payer: Self-pay | Admitting: Internal Medicine

## 2021-09-16 MED ORDER — FLUTICASONE-SALMETEROL 100-50 MCG/ACT IN AEPB: 1.0000 | INHALATION_SPRAY | Freq: Two times a day (BID) | RESPIRATORY_TRACT | 2 refills | Status: DC

## 2021-09-16 NOTE — Telephone Encounter (Signed)
Dr Melvyn Novas please advise, Patient is okay to wait for response.  ? ? ?Patient is calling because his Anoro has gone up in price. He wants to know if there was an alternative he could take  ?

## 2021-09-16 NOTE — Telephone Encounter (Signed)
Patient is scheduled for 10/14/21 with an appt. Sent in wixela 130mg in for patient nothing further needed.  ?

## 2021-09-16 NOTE — Telephone Encounter (Signed)
Ok to give samples until we have them but best way to solve this is ov with formulary to pick alternative  ? ?Other option is to change to wixella 100 one bid as this is usually the cheapest maint rx but would need to be shown how it work and then would still need ov at 4 weeks to be sure works as well.  ?

## 2021-09-18 NOTE — Telephone Encounter (Signed)
Created in error

## 2021-09-28 DIAGNOSIS — J449 Chronic obstructive pulmonary disease, unspecified: Secondary | ICD-10-CM | POA: Diagnosis not present

## 2021-10-14 ENCOUNTER — Ambulatory Visit: Payer: Medicare Other | Admitting: Internal Medicine

## 2021-10-22 ENCOUNTER — Encounter (INDEPENDENT_AMBULATORY_CARE_PROVIDER_SITE_OTHER): Payer: Medicare Other | Admitting: Ophthalmology

## 2021-10-22 DIAGNOSIS — H43813 Vitreous degeneration, bilateral: Secondary | ICD-10-CM

## 2021-10-22 DIAGNOSIS — H353211 Exudative age-related macular degeneration, right eye, with active choroidal neovascularization: Secondary | ICD-10-CM | POA: Diagnosis not present

## 2021-10-22 DIAGNOSIS — H353122 Nonexudative age-related macular degeneration, left eye, intermediate dry stage: Secondary | ICD-10-CM | POA: Diagnosis not present

## 2021-10-23 ENCOUNTER — Other Ambulatory Visit: Payer: Self-pay | Admitting: Internal Medicine

## 2021-10-23 DIAGNOSIS — K219 Gastro-esophageal reflux disease without esophagitis: Secondary | ICD-10-CM

## 2021-10-28 DIAGNOSIS — J449 Chronic obstructive pulmonary disease, unspecified: Secondary | ICD-10-CM | POA: Diagnosis not present

## 2021-11-06 ENCOUNTER — Encounter: Payer: Self-pay | Admitting: Internal Medicine

## 2021-11-06 ENCOUNTER — Ambulatory Visit: Payer: Medicare Other | Admitting: Internal Medicine

## 2021-11-06 DIAGNOSIS — J449 Chronic obstructive pulmonary disease, unspecified: Secondary | ICD-10-CM | POA: Diagnosis not present

## 2021-11-06 DIAGNOSIS — J9611 Chronic respiratory failure with hypoxia: Secondary | ICD-10-CM

## 2021-11-06 NOTE — Patient Instructions (Addendum)
Make sure you check your oxygen saturation  AT  your highest level of activity (not after you stop)   to be sure it stays over 90% and adjust  02 flow upward to maintain this level if needed but remember to turn it back to previous settings when you stop (to conserve your supply).    No change in Advair    Please schedule a follow up visit in  12  months but call sooner if needed

## 2021-11-06 NOTE — Progress Notes (Unsigned)
Patrick Novak, male    DOB: [birth year],    MRN: [id number]   Brief patient profile:  47 yowm quit smoking 2001 due to gastric ca s/p chemo then bad spell of breathing > [name] admitted with pna dx emphysema and ever since then prn saba then [facility] added by Dr Luan Pulling but never back to baseline  main c/o has been variable cough > sob and self referred back to [place] pulmonary [facility]    History of Present Illness  01/09/2020  Pulmonary/ 1st office eval/[place]/ Irina Okelly on ACEi and Breo  Chief Complaint  Patient presents with   Consult    Patient has COPD and has shortness of breath with exertion. Denies cough. Uses [facility] is going to ask about switching  Dyspnea:  MMRC1 = can walk nl pace, flat grade, can't hurry or go uphills or steps s sob  Cough: variable cough when something stuck  Sleep: ok flat  SABA use: rarely  02 prn  rec Change prilosec to Take 30-60 min before first meal of the day > did not do  Stop breo - ok to restart if you are losing ground with breathing >  Did do Only use your albuterol as a rescue medication > about the same use  Make sure you check your oxygen saturations at highest level of activity to be sure it stays over 90%  And if it does you do not need oxygen> did not do  Stop lisinopril and start micardis (telmarsartan) 40 mg  One daily-  ok to break in half if too strong Please schedule a follow up office visit in 6 weeks with PFTs, call sooner if needed    02/27/2020  f/u ov/Antonio Woodhams re: ? Copd/ no worse off breo / cough no change off acei  Chief Complaint  Patient presents with   Follow-up    shortness of breath with exertion, productive cough with white phlegm  Dyspnea:  No change = MMRC1 = can walk nl pace, flat grade, can't hurry or go uphills or steps s sob   Cough: more cough in am congested sounding but mucoid  Sleeping: flat on bed / one pillow  SABA use: rarely now  02: not using rec Try albuterol 15 min before an activity  Also use your albuterol  as a rescue medication   Prilosec (omeprazole)  20 mg Take 30- 60 min before your first and last meals of the day  GERD rx   04/11/2020  f/u ov/[place] office/Raima Geathers re:  GOLD III, ex desats supposed to be on anoro not taking daily  Chief Complaint  Patient presents with   Follow-up    shortness of breath with activity  Dyspnea:  Some better, playing 18 holes golf on Anoro/ but did not take on day of ov Cough: better to his satisfaction Sleeping: flat bed/ one pillow sleeps fine SABA use:no longer   02: has it not using but also not checking sats  Rec Only use your albuterol as a rescue medication  Try albuterol 15 min before an activity that you know would make you short of breath  To get the most out of exercise, you need to be continuously aware that you are short of breath, but never out of breath, for 30 minutes daily  Make sure you check your oxygen saturations at highest level of activity    04/20/2021  f/u ov/[place] office/Zyquan Crotty re: GOLD 3 copd maint on anoro   Chief Complaint  Patient presents with  Follow-up    No SOB. Cough is same as last visit. Mainly a dry cough.  2L O2 at night time.   Dyspnea:  still able to play golf Cough: cough is dry / daytime with throat clearing on ppi and fosamax Sleeping: flat bed/ one pillow sleep fine / no am flare  SABA use: only anoro 02: 2 lpm  hs and prn Covid status: vax x 3   Rec If the cough gets worse you may need a trial off Fosamax (Reclast can be given yearly and does not cause cough)  Be sure to rinse and gargle after anoro GERD diet reviewed, bed blocks rec  Make sure you check your oxygen saturation at your highest level of activity to be sure it stays over 90%       11/06/2021  f/u ov/[place] office/Yoselin Amerman re: GOLD 3  maint on advair 100 bid   Chief Complaint  Patient presents with   Follow-up    Cough has improved.   Dyspnea:  can walk flat anywhere/ hills are a problem Cough: better now  Sleeping: flat  bed / one pillow  SABA use: once a week  02: 2lpm hs and prn not checking  Covid status: vax x 3  infected summer 2022      No obvious day to day or daytime variability or assoc excess/ purulent sputum or mucus plugs or hemoptysis or cp or chest tightness, subjective wheeze or overt sinus or hb symptoms.   *** without nocturnal  or early am exacerbation  of respiratory  c/o's or need for noct saba. Also denies any obvious fluctuation of symptoms with weather or environmental changes or other aggravating or alleviating factors except as outlined above   No unusual exposure hx or h/o childhood pna/ asthma or knowledge of premature birth.  Current Allergies, Complete Past Medical History, Past Surgical History, Family History, and Social History were reviewed in [employer] record.  ROS  The following are not active complaints unless bolded Hoarseness, sore throat, dysphagia, dental problems, itching, sneezing,  nasal congestion or discharge of excess mucus or purulent secretions, ear ache,   fever, chills, sweats, unintended wt loss or wt gain, classically pleuritic or exertional cp,  orthopnea pnd or arm/hand swelling  or leg swelling, presyncope, palpitations, abdominal pain, anorexia, nausea, vomiting, diarrhea  or change in bowel habits or change in bladder habits, change in stools or change in urine, dysuria, hematuria,  rash, arthralgias, visual complaints, headache, numbness, weakness or ataxia or problems with walking or coordination,  change in mood or  memory.        Current Meds  Medication Sig   alendronate (FOSAMAX) 70 MG tablet Take 1 tablet (70 mg total) by mouth once a week.   BESIVANCE 0.6 % SUSP Apply to eye.   Calcium Carbonate-Vitamin D 600-400 MG-UNIT per tablet Take 1 tablet by mouth daily.    fluticasone-salmeterol (WIXELA INHUB) 100-50 MCG/ACT AEPB Inhale 1 puff into the lungs 2 (two) times daily.   levothyroxine (SYNTHROID) 25 MCG tablet     meclizine (ANTIVERT) 12.5 MG tablet Take by mouth.   meclizine (ANTIVERT) 12.5 MG tablet Take 1 tablet (12.5 mg total) by mouth 2 (two) times daily as needed.   meloxicam (MOBIC) 15 MG tablet Take 1 tablet (15 mg total) by mouth daily.   Multiple Vitamin (MULTIVITAMIN) tablet Take 1 tablet by mouth daily.     Multiple Vitamins-Minerals (PRESERVISION AREDS PO) Take by mouth.   omeprazole (PRILOSEC)  20 MG capsule TAKE 1 CAPSULE BY MOUTH TWICE DAILY 30 TO 60 MINUTES BEFORE FIRST AND LAST MEALS OF THE DAYS   ondansetron (ZOFRAN) 4 MG tablet Take 1 tablet (4 mg total) by mouth every 8 (eight) hours as needed for nausea or vomiting.   telmisartan (MICARDIS) 40 MG tablet Take 1 tablet (40 mg total) by mouth daily.   Testosterone 1.62 % GEL APPLY 4 PUMPS TOPICALLY ONCE DAILY TO SHOULDER AREA   tiZANidine (ZANAFLEX) 2 MG tablet Take 1 tablet (2 mg total) by mouth 2 (two) times daily as needed for muscle spasms.                                  Past Medical History:  Diagnosis Date   Bradycardia    asympotmatic. Holter (2/11) with PACs, 1 run of 5 beats NSVT, HR range 41-125 with no pauses.    Cancer ([place])    gastric lymphoma   COPD (chronic obstructive pulmonary disease) ([facility])    ED (erectile dysfunction)    Gastric ulcer    GERD (gastroesophageal reflux disease)    Hiatal hernia    History of stomach cancer    surgery in 2001   HTN (hypertension)    Low testosterone    MR (mitral regurgitation)    echo 2/11: EF 60%, no regional wall abnormalities, mild MR, mild to moderate RV dialation, mild to moderate RV dysfunction    Osteopenia    Vertigo    Vertigo    Vitamin D deficiency       Objective:     Wts  11/06/2021        164 04/20/2021      167   04/11/2020    169   02/27/20 167 lb 12.8 oz (76.1 kg)  01/09/20 167 lb (75.8 kg)  09/13/19 168 lb (76.2 kg)     Vital signs reviewed  11/06/2021  - Note at rest 02 sats  96% on RA   General appearance:    amb elderly wm  nad    HEENT : Oropharynx  ***  Nasal turbintes ***   NECK :  without  appent JVD/ palpable Nodes/TM    LUNGS: no acc muscle use,  Mild barrel  contour chest wall with bilateral  Distant bs s audible wheeze and  without cough on insp or exp maneuvers  and mild  Hyperresonant  to  percussion bilaterally     CV:  RRR  no s3 or murmur or increase in P2, and no edema   ABD:  soft and nontender with pos end  insp Hoover's  in the supine position.  No bruits or organomegaly appreciated   MS:  Nl gait/ ext warm without deformities Or obvious joint restrictions  calf tenderness, cyanosis or clubbing     SKIN: warm and dry without lesions    NEURO:  alert, approp, nl sensorium with  no motor or cerebellar deficits apparent.                 Assessment

## 2021-11-07 ENCOUNTER — Encounter: Payer: Self-pay | Admitting: Internal Medicine

## 2021-11-07 NOTE — Assessment & Plan Note (Addendum)
Stopped smoking 2001 s resp symptoms, later told had emphysema - try off breo and ACEi as of 01/09/2020 > just use saba short term  - alpha one phenotype  02/27/20    MM   Level 134 - PFT's  03/18/20  FEV1 1.44 (48 % ) ratio 0.44  p 11 % improvement from saba p 0 prior to study with DLCO  8.72 (34%) corrects to 1.68 (43%)  for alv volume and FV curve classic concavity/ air trapping present    At this point appears Pt is Group B in terms of symptom/risk and laba/lama therefore appropriate rx at this point >>>  Doing fine on just advair 100 and did not tol anoro so leave on advair for the LABA component

## 2021-11-07 NOTE — Assessment & Plan Note (Signed)
Rx since around  2018 [name] p pna   - 02/27/2020   Walked RA  approx   200 ft  @ avg pace  stopped due to desats to 88% at end of study though denies doe    - 04/20/2021   Walked on RA x  2  lap(s) =  approx 500 @ mod pace, stopped due to sob with sats down to 87%  with improvement to 94% on 2lpm  And completed another 250 ft s desats -  11/06/2021   Walked on RA x  3  lap(s) =  approx 450  ft  @ mod to fast pace, stopped due to end of study s sob with lowest 02 sats 91%    Adequate control on present rx, reviewed in detail with pt > no change in rx needed  = 2lpm hs and prn daytime if sats < 90% at highest level of exertion (or walk at slower pace)   F/u can be q 12 m - call sooner prn   Each maintenance medication was reviewed in detail including emphasizing most importantly the difference between maintenance and prns and under what circumstances the prns are to be triggered using an action plan format where appropriate.  Total time for H and P, chart review, counseling,  directly observing portions of ambulatory 02 saturation study/ and generating customized AVS unique to this office visit / same day charting = 27 min

## 2021-11-28 DIAGNOSIS — J449 Chronic obstructive pulmonary disease, unspecified: Secondary | ICD-10-CM | POA: Diagnosis not present

## 2021-12-03 ENCOUNTER — Encounter (INDEPENDENT_AMBULATORY_CARE_PROVIDER_SITE_OTHER): Payer: Medicare Other | Admitting: Ophthalmology

## 2021-12-03 DIAGNOSIS — H353211 Exudative age-related macular degeneration, right eye, with active choroidal neovascularization: Secondary | ICD-10-CM

## 2021-12-03 DIAGNOSIS — H353122 Nonexudative age-related macular degeneration, left eye, intermediate dry stage: Secondary | ICD-10-CM | POA: Diagnosis not present

## 2021-12-03 DIAGNOSIS — H43813 Vitreous degeneration, bilateral: Secondary | ICD-10-CM

## 2021-12-24 ENCOUNTER — Telehealth: Payer: Self-pay | Admitting: Registered Nurse

## 2021-12-24 ENCOUNTER — Other Ambulatory Visit: Payer: Self-pay

## 2021-12-24 MED ORDER — LEVOTHYROXINE SODIUM 25 MCG PO TABS: 25.0000 ug | ORAL_TABLET | Freq: Every day | ORAL | 0 refills | Status: AC

## 2021-12-24 NOTE — Telephone Encounter (Signed)
Encourage patient to contact the pharmacy for refills or they can request refills through [facility]  (Please schedule appointment if patient has not been seen in over a year)    WHAT PHARMACY WOULD THEY LIKE THIS SENT TO: [facility] [address] [phone]  MEDICATION NAME & DOSE: lexothyroxine 25 mg  NOTES/COMMENTS FROM PATIENT: today if at all possible      [place] office please notify patient: It takes 48-72 hours to process rx refill requests Ask patient to call pharmacy to ensure rx is ready before heading there.

## 2021-12-24 NOTE — Telephone Encounter (Signed)
Medication has been sent to the pharmacy. 

## 2021-12-28 DIAGNOSIS — J449 Chronic obstructive pulmonary disease, unspecified: Secondary | ICD-10-CM | POA: Diagnosis not present

## 2021-12-29 ENCOUNTER — Telehealth: Payer: Self-pay | Admitting: Registered Nurse

## 2021-12-29 NOTE — Telephone Encounter (Signed)
Left message for patient to call back and schedule Medicare Annual Wellness Visit (AWV) .   Please offer to do virtually or by telephone.  Left office number and my jabber #[phone].  Last AWV:04/25/2019   Please schedule at anytime with Nurse Health Advisor.  

## 2022-01-08 ENCOUNTER — Encounter (INDEPENDENT_AMBULATORY_CARE_PROVIDER_SITE_OTHER): Payer: Medicare Other | Admitting: Ophthalmology

## 2022-01-08 DIAGNOSIS — H43813 Vitreous degeneration, bilateral: Secondary | ICD-10-CM | POA: Diagnosis not present

## 2022-01-08 DIAGNOSIS — H353122 Nonexudative age-related macular degeneration, left eye, intermediate dry stage: Secondary | ICD-10-CM | POA: Diagnosis not present

## 2022-01-08 DIAGNOSIS — H353211 Exudative age-related macular degeneration, right eye, with active choroidal neovascularization: Secondary | ICD-10-CM

## 2022-01-14 ENCOUNTER — Ambulatory Visit: Payer: Medicare Other

## 2022-01-19 ENCOUNTER — Ambulatory Visit (INDEPENDENT_AMBULATORY_CARE_PROVIDER_SITE_OTHER): Payer: Medicare Other

## 2022-01-19 DIAGNOSIS — Z0000 Encounter for general adult medical examination without abnormal findings: Secondary | ICD-10-CM

## 2022-01-19 NOTE — Patient Instructions (Signed)
Patrick Novak , Thank you for taking time to come for your Medicare Wellness Visit. I appreciate your ongoing commitment to your health goals. Please review the following plan we discussed and let me know if I can assist you in the future.   Screening recommendations/referrals: Colonoscopy: no longer required  Recommended yearly ophthalmology/optometry visit for glaucoma screening and checkup Recommended yearly dental visit for hygiene and checkup  Vaccinations: Influenza vaccine: completed  Pneumococcal vaccine: completed  Tdap vaccine: 10/22/2013 Shingles vaccine: will consider     Advanced directives: none   Conditions/risks identified: none   Next appointment: none   Preventive Care 25 Years and Older, Male Preventive care refers to lifestyle choices and visits with your health care provider that can promote health and wellness. What does preventive care include? A yearly physical exam. This is also called an annual well check. Dental exams once or twice a year. Routine eye exams. Ask your health care provider how often you should have your eyes checked. Personal lifestyle choices, including: Daily care of your teeth and gums. Regular physical activity. Eating a healthy diet. Avoiding tobacco and drug use. Limiting alcohol use. Practicing safe sex. Taking low doses of aspirin every day. Taking vitamin and mineral supplements as recommended by your health care provider. What happens during an annual well check? The services and screenings done by your health care provider during your annual well check will depend on your age, overall health, lifestyle risk factors, and family history of disease. Counseling  Your health care provider may ask you questions about your: Alcohol use. Tobacco use. Drug use. Emotional well-being. Home and relationship well-being. Sexual activity. Eating habits. History of falls. Memory and ability to understand (cognition). Work and work  Statistician. Screening  You may have the following tests or measurements: Height, weight, and BMI. Blood pressure. Lipid and cholesterol levels. These may be checked every 5 years, or more frequently if you are over 31 years old. Skin check. Lung cancer screening. You may have this screening every year starting at age 59 if you have a 30-pack-year history of smoking and currently smoke or have quit within the past 15 years. Fecal occult blood test (FOBT) of the stool. You may have this test every year starting at age 6. Flexible sigmoidoscopy or colonoscopy. You may have a sigmoidoscopy every 5 years or a colonoscopy every 10 years starting at age 47. Prostate cancer screening. Recommendations will vary depending on your family history and other risks. Hepatitis C blood test. Hepatitis B blood test. Sexually transmitted disease (STD) testing. Diabetes screening. This is done by checking your blood sugar (glucose) after you have not eaten for a while (fasting). You may have this done every 1-3 years. Abdominal aortic aneurysm (AAA) screening. You may need this if you are a current or former smoker. Osteoporosis. You may be screened starting at age 32 if you are at high risk. Talk with your health care provider about your test results, treatment options, and if necessary, the need for more tests. Vaccines  Your health care provider may recommend certain vaccines, such as: Influenza vaccine. This is recommended every year. Tetanus, diphtheria, and acellular pertussis (Tdap, Td) vaccine. You may need a Td booster every 10 years. Zoster vaccine. You may need this after age 70. Pneumococcal 13-valent conjugate (PCV13) vaccine. One dose is recommended after age 59. Pneumococcal polysaccharide (PPSV23) vaccine. One dose is recommended after age 73. Talk to your health care provider about which screenings and vaccines you need and  how often you need them. This information is not intended to replace  advice given to you by your health care provider. Make sure you discuss any questions you have with your health care provider. Document Released: 07/04/2015 Document Revised: 02/25/2016 Document Reviewed: 04/08/2015 Elsevier Interactive Patient Education  2017 [place] Prevention in the Home Falls can cause injuries. They can happen to people of all ages. There are many things you can do to make your home safe and to help prevent falls. What can I do on the outside of my home? Regularly fix the edges of walkways and driveways and fix any cracks. Remove anything that might make you trip as you walk through a door, such as a raised step or threshold. Trim any bushes or trees on the path to your home. Use bright outdoor lighting. Clear any walking paths of anything that might make someone trip, such as rocks or tools. Regularly check to see if handrails are loose or broken. Make sure that both sides of any steps have handrails. Any raised decks and porches should have guardrails on the edges. Have any leaves, snow, or ice cleared regularly. Use sand or salt on walking paths during winter. Clean up any spills in your garage right away. This includes oil or grease spills. What can I do in the bathroom? Use night lights. Install grab bars by the toilet and in the tub and shower. Do not use towel bars as grab bars. Use non-skid mats or decals in the tub or shower. If you need to sit down in the shower, use a plastic, non-slip stool. Keep the floor dry. Clean up any water that spills on the floor as soon as it happens. Remove soap buildup in the tub or shower regularly. Attach bath mats securely with double-sided non-slip rug tape. Do not have throw rugs and other things on the floor that can make you trip. What can I do in the bedroom? Use night lights. Make sure that you have a light by your bed that is easy to reach. Do not use any sheets or blankets that are too big for your bed.  They should not hang down onto the floor. Have a firm chair that has side arms. You can use this for support while you get dressed. Do not have throw rugs and other things on the floor that can make you trip. What can I do in the kitchen? Clean up any spills right away. Avoid walking on wet floors. Keep items that you use a lot in easy-to-reach places. If you need to reach something above you, use a strong step stool that has a grab bar. Keep electrical cords out of the way. Do not use floor polish or wax that makes floors slippery. If you must use wax, use non-skid floor wax. Do not have throw rugs and other things on the floor that can make you trip. What can I do with my stairs? Do not leave any items on the stairs. Make sure that there are handrails on both sides of the stairs and use them. Fix handrails that are broken or loose. Make sure that handrails are as long as the stairways. Check any carpeting to make sure that it is firmly attached to the stairs. Fix any carpet that is loose or worn. Avoid having throw rugs at the top or bottom of the stairs. If you do have throw rugs, attach them to the floor with carpet tape. Make sure that you have  a light switch at the top of the stairs and the bottom of the stairs. If you do not have them, ask someone to add them for you. What else can I do to help prevent falls? Wear shoes that: Do not have high heels. Have rubber bottoms. Are comfortable and fit you well. Are closed at the toe. Do not wear sandals. If you use a stepladder: Make sure that it is fully opened. Do not climb a closed stepladder. Make sure that both sides of the stepladder are locked into place. Ask someone to hold it for you, if possible. Clearly mark and make sure that you can see: Any grab bars or handrails. First and last steps. Where the edge of each step is. Use tools that help you move around (mobility aids) if they are needed. These  include: Canes. Walkers. Scooters. Crutches. Turn on the lights when you go into a dark area. Replace any light bulbs as soon as they burn out. Set up your furniture so you have a clear path. Avoid moving your furniture around. If any of your floors are uneven, fix them. If there are any pets around you, be aware of where they are. Review your medicines with your doctor. Some medicines can make you feel dizzy. This can increase your chance of falling. Ask your doctor what other things that you can do to help prevent falls. This information is not intended to replace advice given to you by your health care provider. Make sure you discuss any questions you have with your health care provider. Document Released: 04/03/2009 Document Revised: 11/13/2015 Document Reviewed: 07/12/2014 Elsevier Interactive Patient Education  2017 [employer].

## 2022-01-19 NOTE — Progress Notes (Signed)
Subjective:   Patrick Novak is a 81 y.o. male who presents for an subsequent  Medicare Annual Wellness Visit.   I connected with Patrick Novak  today by telephone and verified that I am speaking with the correct person using two identifiers. Location patient: home Location provider: work Persons participating in the virtual visit: patient, provider.   I discussed the limitations, risks, security and privacy concerns of performing an evaluation and management service by telephone and the availability of in person appointments. I also discussed with the patient that there may be a patient responsible charge related to this service. The patient expressed understanding and verbally consented to this telephonic visit.    Interactive audio and video telecommunications were attempted between this provider and patient, however failed, due to patient having technical difficulties OR patient did not have access to video capability.  We continued and completed visit with audio only.    Review of Systems     Cardiac Risk Factors include: advanced age (>68mn, >>87women);male gender     Objective:    Today's Vitals   There is no height or weight on file to calculate BMI.     01/19/2022    1:02 PM 06/17/2021   11:26 AM 04/25/2019    9:52 AM 03/30/2018    3:44 PM 03/30/2018   10:59 AM 03/17/2017   11:03 AM 03/10/2016   10:51 AM  Advanced Directives  Does Patient Have a Medical Advance Directive? No No No  No No No  Would patient like information on creating a medical advance directive? No - Patient declined  No - Patient declined Yes (MAU/Ambulatory/Procedural Areas - Information given) No - Patient declined Yes (ED - Information included in AVS) Yes - Educational materials given    Current Medications (verified) Outpatient Encounter Medications as of 01/19/2022  Medication Sig   alendronate (FOSAMAX) 70 MG tablet Take 1 tablet (70 mg total) by mouth once a week.   BESIVANCE 0.6 % SUSP Apply  to eye.   Calcium Carbonate-Vitamin D 600-400 MG-UNIT per tablet Take 1 tablet by mouth daily.    fluticasone-salmeterol (WIXELA INHUB) 100-50 MCG/ACT AEPB Inhale 1 puff into the lungs 2 (two) times daily.   levothyroxine (SYNTHROID) 25 MCG tablet Take 1 tablet (25 mcg total) by mouth daily before breakfast.   meclizine (ANTIVERT) 12.5 MG tablet Take by mouth.   meclizine (ANTIVERT) 12.5 MG tablet Take 1 tablet (12.5 mg total) by mouth 2 (two) times daily as needed.   meloxicam (MOBIC) 15 MG tablet Take 1 tablet (15 mg total) by mouth daily.   Multiple Vitamin (MULTIVITAMIN) tablet Take 1 tablet by mouth daily.     Multiple Vitamins-Minerals (PRESERVISION AREDS PO) Take by mouth.   omeprazole (PRILOSEC) 20 MG capsule TAKE 1 CAPSULE BY MOUTH TWICE DAILY 30 TO 60 MINUTES BEFORE FIRST AND LAST MEALS OF THE DAYS   ondansetron (ZOFRAN) 4 MG tablet Take 1 tablet (4 mg total) by mouth every 8 (eight) hours as needed for nausea or vomiting.   telmisartan (MICARDIS) 40 MG tablet Take 1 tablet (40 mg total) by mouth daily.   Testosterone 1.62 % GEL APPLY 4 PUMPS TOPICALLY ONCE DAILY TO SHOULDER AREA (Patient not taking: Reported on 01/19/2022)   tiZANidine (ZANAFLEX) 2 MG tablet Take 1 tablet (2 mg total) by mouth 2 (two) times daily as needed for muscle spasms. (Patient not taking: Reported on 01/19/2022)   umeclidinium-vilanterol (ANORO ELLIPTA) 62.5-25 MCG/ACT AEPB Inhale 1 puff into the lungs  daily. (Patient not taking: Reported on 01/19/2022)   No facility-administered encounter medications on file as of 01/19/2022.    Allergies (verified) Codeine, Hydrocodone-acetaminophen, Ciprofloxacin, Doxycycline, Sulfa antibiotics, and Zithromax [azithromycin]   History: Past Medical History:  Diagnosis Date   Bradycardia    asympotmatic. Holter (2/11) with PACs, 1 run of 5 beats NSVT, HR range 41-125 with no pauses.    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   Family History  Problem Relation Age of Onset   Stroke Father    Heart disease Mother    Stroke Brother    Hip fracture Sister    Cancer Sister        breast   Heart disease Sister    Hepatitis Sister    Valvular heart disease Sister    Alzheimer's disease Sister    Lung disease Sister    Early death Brother    Social History   Socioeconomic History   Marital status: Married    Spouse name: [name]   Number of children: 4   Years of education: 11   Highest education level: GED or equivalent  Occupational History   Occupation: retired  Tobacco Use   Smoking status: Former    Packs/day: 1.50    Years: 47.00    Total pack years: 70.50    Types: Cigarettes    Quit date: 08/21/1999    Years since quitting: 22.4   Smokeless tobacco: Never   Tobacco comments:    quit in 2000  Vaping Use   Vaping Use: Never used  Substance and Sexual Activity   Alcohol use: No   Drug use: No   Sexual activity: Yes    Birth control/protection: None  Other Topics Concern   Not on file  Social History Narrative   Married, lives in [place].   Retired Psychologist, sport and exercise, does some carpentry.    Social Determinants of Health   Financial Resource Strain: Low Risk  (01/19/2022)   Overall Financial Resource Strain (CARDIA)    Difficulty of Paying Living Expenses: Not hard at all  Food Insecurity: No Food Insecurity (01/19/2022)   Hunger Vital Sign    Worried About Running Out of  Food in the Last Year: Never true    Ran Out of Food in the Last Year: Never true  Transportation Needs: No Transportation Needs (01/19/2022)   PRAPARE - Hydrologist (Medical): No    Lack of Transportation (Non-Medical): No  Physical Activity: Insufficiently Active (01/19/2022)   Exercise Vital Sign    Days of Exercise per Week: 3 days    Minutes of Exercise per Session: 30 min  Stress: No Stress Concern Present (01/19/2022)   [place]    Feeling  of Stress : Not at all  Social Connections: Moderately Integrated (01/19/2022)   Social Connection and Isolation Panel [NHANES]    Frequency of Communication with Friends and Family: Three times a week    Frequency of Social Gatherings with Friends and Family: Three times a week    Attends Religious Services: More than 4 times per year    Active Member of Clubs or Organizations: No    Attends Archivist Meetings: Never    Marital Status: Married    Tobacco Counseling Counseling given: Not Answered Tobacco comments: quit in 2000   Clinical Intake:  Pre-visit preparation completed: Yes  Pain : No/denies pain     Nutritional Risks: None Diabetes: No  How often do you need to have someone help you when you read instructions, pamphlets, or other written materials from your doctor or pharmacy?: 1 - Never What is the last grade level you completed in school?: [place]   Interpreter Needed?: No  Information entered by :: L.Wilson,LPN   Activities of Daily Living    01/19/2022    1:05 PM  In your present state of health, do you have any difficulty performing the following activities:  Hearing? 0  Vision? 0  Difficulty concentrating or making decisions? 0  Walking or climbing stairs? 0  Dressing or bathing? 0  Doing errands, shopping? 0  Preparing Food and eating ? N  Using the Toilet? N  In the past six months,  have you accidently leaked urine? N  Do you have problems with loss of bowel control? N  Managing your Medications? N  Managing your Finances? N  Housekeeping or managing your Housekeeping? N    Patient Care Team: Maximiano Coss, NP as PCP - General (Adult Health Nurse Practitioner) Larey Dresser, MD as Consulting Physician (Cardiology) Lavonna Monarch, MD as Consulting Physician (Dermatology) Sinda Du, MD as Consulting Physician (Pulmonary Disease)  Indicate any recent Medical Services you may have received from other than [facility] providers in the past year (date may be approximate).     Assessment:   This is a routine wellness examination for Charlie.  Hearing/Vision screen Vision Screening - Comments:: Annual eye exams   Dietary issues and exercise activities discussed: Current Exercise Habits: Home exercise routine, Type of exercise: walking, Time (Minutes): 30, Frequency (Times/Week): 3, Weekly Exercise (Minutes/Week): 90, Intensity: Mild   Goals Addressed   None    Depression Screen    01/19/2022    1:05 PM 01/19/2022    1:02 PM 01/19/2022    1:00 PM 09/07/2021    8:50 AM 06/02/2021   10:24 AM 03/03/2021    9:10 AM 09/13/2019   12:36 PM  PHQ 2/9 Scores  PHQ - 2 Score 0 0 0 0 0 0 0  PHQ- 9 Score    0 0      Fall Risk    01/19/2022    1:02 PM 09/07/2021    8:50 AM 06/02/2021   10:24 AM 03/03/2021    9:09 AM 09/13/2019   12:36 PM  [place] in the past year? 0 0 0 0 0  Number falls in past yr: 0 0 0 0   Injury with Fall? 0 0 0 0   Risk for fall due to :  No Fall Risks No Fall Risks No Fall Risks   Follow up Falls evaluation completed;Education provided Falls evaluation completed Falls evaluation completed Falls evaluation completed     FALL RISK  PREVENTION PERTAINING TO THE HOME:  Any stairs in or around the home? Yes  If so, are there any without handrails? No  Home free of loose throw rugs in walkways, pet beds, electrical cords, etc? No  Adequate  lighting in your home to reduce risk of falls? Yes   ASSISTIVE DEVICES UTILIZED TO PREVENT FALLS:  Life alert? No  Use of a cane, walker or w/c? No  Grab bars in the bathroom? No  Shower chair or bench in shower? No  Elevated toilet seat or a handicapped toilet? No    Cognitive Function:Normal cognitive status assessed by telephone conversation  by this Nurse Health Advisor. No abnormalities found.      03/30/2018    3:41 PM 03/10/2016   10:56 AM 09/20/2014   10:13 AM  MMSE - Mini Mental State Exam  Orientation to time '5 4 5  '$ Orientation to Place '5 5 5  '$ Registration '3 3 3  '$ Attention/ Calculation '5 5 5  '$ Recall '3 3 3  '$ Language- name 2 objects '2 2 2  '$ Language- repeat '1 1 1  '$ Language- follow 3 step command '3 3 3  '$ Language- read & follow direction '1 1 1  '$ Write a sentence '1 1 1  '$ Copy design '1 1 1  '$ Total score '30 29 30        '$ 01/19/2022    1:06 PM 04/25/2019    9:56 AM  6CIT Screen  What Year? 0 points 0 points  What month? 0 points 0 points  What time? 0 points 0 points  Count back from 20 0 points 0 points  Months in reverse 0 points 0 points  Repeat phrase 0 points 2 points  Total Score 0 points 2 points    Immunizations Immunization History  Administered Date(s) Administered   DTaP 07/23/2002   Fluad Quad(high Dose 65+) 03/28/2019, 03/03/2021   H1N1 05/14/2008   Influenza Whole 03/22/2012   Influenza, High Dose Seasonal PF 03/17/2017, 03/30/2018   Influenza,inj,Quad PF,6+ Mos 03/27/2015, 03/10/2016   Influenza,inj,quad, With Preservative 04/22/2018   Influenza-Unspecified 05/21/2014, 03/14/2020   Moderna Sars-Covid-2 Vaccination 07/14/2019, 08/11/2019   Pneumococcal Conjugate-13 05/07/2015   Pneumococcal Polysaccharide-23 08/21/2006   Tdap 10/22/2013   Zoster, Live 08/20/2009    TDAP status: Up to date  Flu Vaccine status: Up to date  Pneumococcal vaccine status: Up to date  Covid-19 vaccine status: Completed vaccines  Qualifies for Shingles Vaccine?  Yes   Zostavax completed No   Shingrix Completed?: No.    Education has been provided regarding the importance of this vaccine. Patient has been advised to call insurance company to determine out of pocket expense if they have not yet received this vaccine. Advised may also receive vaccine at local pharmacy or Health Dept. Verbalized acceptance and understanding.  Screening Tests Health Maintenance  Topic Date Due   Zoster Vaccines- Shingrix (1 of 2) Never done   COVID-19 Vaccine (4 - Booster for Moderna series) 09/11/2020   INFLUENZA VACCINE  01/19/2022   DEXA SCAN  03/03/2022 (Originally 12/14/2018)   TETANUS/TDAP  11/02/2023   Pneumonia Vaccine 71+ Years old  Completed   HPV VACCINES  Aged Out    Health Maintenance  Health Maintenance Due  Topic Date Due   Zoster Vaccines- Shingrix (1 of 2) Never done   COVID-19 Vaccine (4 - Booster for Moderna series) 09/11/2020   INFLUENZA VACCINE  01/19/2022    Colorectal cancer screening: No longer required.   Lung Cancer Screening: (Low Dose CT  Chest recommended if Age 72-80 years, 30 pack-year currently smoking OR have quit w/in 15years.) does not qualify.   Lung Cancer Screening Referral: n/a  Additional Screening:  Hepatitis C Screening: does not qualify;   Vision Screening: Recommended annual ophthalmology exams for early detection of glaucoma and other disorders of the eye. Is the patient up to date with their annual eye exam?  Yes  Who is the provider or what is the name of the office in which the patient attends annual eye exams? Dr.Tunner  If pt is not established with a provider, would they like to be referred to a provider to establish care? No .   Dental Screening: Recommended annual dental exams for proper oral hygiene  Community Resource Referral / Chronic Care Management: CRR required this visit?  No   CCM required this visit?  No      Plan:     I have personally reviewed and noted the following in the  patient's chart:   Medical and social history Use of alcohol, tobacco or illicit drugs  Current medications and supplements including opioid prescriptions. Patient is not currently taking opioid prescriptions. Functional ability and status Nutritional status Physical activity Advanced directives List of other physicians Hospitalizations, surgeries, and ER visits in previous 12 months Vitals Screenings to include cognitive, depression, and falls Referrals and appointments  In addition, I have reviewed and discussed with patient certain preventive protocols, quality metrics, and best practice recommendations. A written personalized care plan for preventive services as well as general preventive health recommendations were provided to patient.     Daphane Shepherd, LPN   4/0/0867   Nurse Notes: none

## 2022-01-26 ENCOUNTER — Other Ambulatory Visit: Payer: Self-pay | Admitting: Registered Nurse

## 2022-01-26 DIAGNOSIS — G8929 Other chronic pain: Secondary | ICD-10-CM

## 2022-02-08 DIAGNOSIS — M5416 Radiculopathy, lumbar region: Secondary | ICD-10-CM | POA: Insufficient documentation

## 2022-02-19 ENCOUNTER — Other Ambulatory Visit: Payer: Self-pay | Admitting: Internal Medicine

## 2022-02-24 ENCOUNTER — Encounter: Payer: Self-pay | Admitting: *Deleted

## 2022-02-24 ENCOUNTER — Telehealth: Payer: Self-pay | Admitting: *Deleted

## 2022-02-24 DIAGNOSIS — I10 Essential (primary) hypertension: Secondary | ICD-10-CM

## 2022-02-24 NOTE — Patient Outreach (Addendum)
  Care Coordination   Initial Visit Note   02/24/2022 Name: Patrick Novak MRN: [id number] DOB: 05-18-1941  Patrick Novak is a 81 y.o. year old male who sees Patrick Coss, NP for primary care. I  spoke with spouse Patrick Novak.  What matters to the patients health and wellness today?  na    Goals Addressed   None     SDOH assessments and interventions completed:  No     Care Coordination Interventions Activated:  No  Care Coordination Interventions:  No, not indicated   Follow up plan: Follow up call scheduled for 3:45pm 9/6    Encounter Outcome:  Pt. Request to Call Back   Patrick Mina, RN Care Management Coordinator [place] Office [phone]

## 2022-02-24 NOTE — Patient Instructions (Signed)
Visit Information  Thank you for taking time to visit with me today. Please don't hesitate to contact me if I can be of assistance to you.   Following are the goals we discussed today:   Goals Addressed               This Visit's Progress     COMPLETED: Assitance with food (pt-stated)        Care Coordination Interventions: Reviewed medications with patient and discussed purpose of medications Reviewed scheduled/upcoming provider appointments including pending appointments and verified pt completed his AWV on 01/19/2022 Care Guide referral for Food resources in the area. Pt states him and his wife "run short of food supplies" on occassions. Screening for signs and symptoms of depression related to chronic disease state  Assessed social determinant of health barriers Pt declined a follow up call on this request.         Please call the care guide team at [phone] if you need to cancel or reschedule your appointment.   If you are experiencing a Mental Health or [place] or need someone to talk to, please call the Suicide and Crisis Lifeline: 988  Patient verbalizes understanding of instructions and care plan provided today and agrees to view in [place]. Active MyChart status and patient understanding of how to access instructions and care plan via MyChart confirmed with patient.     No further follow up required: No further needs at this time.  Raina Mina, RN Care Management Coordinator [place] Office [phone]

## 2022-02-24 NOTE — Patient Outreach (Signed)
  Care Coordination   Initial Visit Note   02/24/2022 Name: Patrick Novak MRN: [id number] DOB: [birth year]  Patrick Novak is a 81 y.o. year old male who sees Maximiano Coss, NP for primary care. I spoke with  [name] by phone today.  What matters to the patients health and wellness today?  Food resources   Goals Addressed               This Visit's Progress     COMPLETED: Assitance with food (pt-stated)        Care Coordination Interventions: Reviewed medications with patient and discussed purpose of medications Reviewed scheduled/upcoming provider appointments including pending appointments and verified pt completed his AWV on 01/19/2022 Care Guide referral for Food resources in the area. Pt states him and his wife "run short of food supplies" on occassions. Screening for signs and symptoms of depression related to chronic disease state  Assessed social determinant of health barriers Pt declined a follow up call on this request.         SDOH assessments and interventions completed:  Yes  SDOH Interventions Today    Flowsheet Row Most Recent Value  SDOH Interventions   Food Insecurity Interventions Ambulatory [id number] Order  Housing Interventions Intervention Not Indicated  Transportation Interventions Intervention Not Indicated        Care Coordination Interventions Activated:  Yes  Care Coordination Interventions:  Yes, provided   Follow up plan: No further intervention required.   Encounter Outcome:  Pt. Visit Completed   Raina Mina, RN Care Management Coordinator [place] Office [phone]

## 2022-02-25 ENCOUNTER — Telehealth: Payer: Self-pay

## 2022-02-25 NOTE — Telephone Encounter (Signed)
   Telephone encounter was:  Unsuccessful.  02/25/2022 Name: Patrick Novak MRN: [id number] DOB: 10/03/1940  Unsuccessful outbound call made today to assist with:  Food Insecurity  Outreach Attempt:  1st Attempt  A HIPAA compliant voice message was left requesting a return call.  Instructed patient to call back at [phone].  [place] Resource Care Guide   ??[email or web address]  ?? [phone]   Website: [email or web address]  "We don't say no, we SHOW how!"         The [facility]

## 2022-02-26 ENCOUNTER — Telehealth: Payer: Self-pay

## 2022-02-26 ENCOUNTER — Encounter (INDEPENDENT_AMBULATORY_CARE_PROVIDER_SITE_OTHER): Payer: Medicare Other | Admitting: Ophthalmology

## 2022-02-26 DIAGNOSIS — H353122 Nonexudative age-related macular degeneration, left eye, intermediate dry stage: Secondary | ICD-10-CM | POA: Diagnosis not present

## 2022-02-26 DIAGNOSIS — H353211 Exudative age-related macular degeneration, right eye, with active choroidal neovascularization: Secondary | ICD-10-CM

## 2022-02-26 DIAGNOSIS — H43813 Vitreous degeneration, bilateral: Secondary | ICD-10-CM | POA: Diagnosis not present

## 2022-02-26 NOTE — Telephone Encounter (Signed)
   Telephone encounter was:  Unsuccessful.  02/26/2022 Name: Patrick Novak MRN: [id number] DOB: Mar 06, 1941  Unsuccessful outbound call made today to assist with:  Food Insecurity  Outreach Attempt:  2nd Attempt  A HIPAA compliant voice message was left requesting a return call.  Instructed patient to call back at [phone].  [place] Resource Care Guide   ??[email or web address]  ?? [phone]   Website: [email or web address]  [email or web address]  "We don't say no, we SHOW how!"         The [facility]

## 2022-03-01 ENCOUNTER — Telehealth: Payer: Self-pay

## 2022-03-01 NOTE — Telephone Encounter (Signed)
   Telephone encounter was:  Successful.  03/01/2022 Name: RISHON THILGES MRN: [id number] DOB: [birth year]  OTTAVIO NOREM is a 81 y.o. year old male who is a primary care patient of Maximiano Coss, NP . The community resource team was consulted for assistance with [place] guide performed the following interventions: Spoke with patient verified home address to mail food pantry list per patient's request.  Patient has my name and number to call if he does not receive resource letter in the next 7-10 business days. Letter saved in Epic.  Follow Up Plan:  No further follow up planned at this time. The patient has been provided with needed resources.  [place] Resource Care Guide   ??[email or web address]  ?? [phone]   Website: [email or web address]  [email or web address]  "We don't say no, we SHOW how!"         The [facility] Department

## 2022-03-11 ENCOUNTER — Ambulatory Visit: Payer: Medicare Other | Admitting: Registered Nurse

## 2022-04-30 ENCOUNTER — Encounter (INDEPENDENT_AMBULATORY_CARE_PROVIDER_SITE_OTHER): Payer: Medicare Other | Admitting: Ophthalmology

## 2022-04-30 DIAGNOSIS — H43813 Vitreous degeneration, bilateral: Secondary | ICD-10-CM | POA: Diagnosis not present

## 2022-04-30 DIAGNOSIS — H353122 Nonexudative age-related macular degeneration, left eye, intermediate dry stage: Secondary | ICD-10-CM | POA: Diagnosis not present

## 2022-04-30 DIAGNOSIS — H353211 Exudative age-related macular degeneration, right eye, with active choroidal neovascularization: Secondary | ICD-10-CM | POA: Diagnosis not present

## 2022-05-03 ENCOUNTER — Other Ambulatory Visit: Payer: Self-pay | Admitting: Family Medicine

## 2022-05-03 DIAGNOSIS — G8929 Other chronic pain: Secondary | ICD-10-CM

## 2022-05-10 IMAGING — DX DG CHEST 2V
2 series · 2 of 2 positions shown · non-contrast
Comparison: 09/15/2019

CLINICAL DATA: COPD, history of gastric cancer, previous tobacco
abuse

EXAM:
CHEST - 2 VIEW

[chest pa]
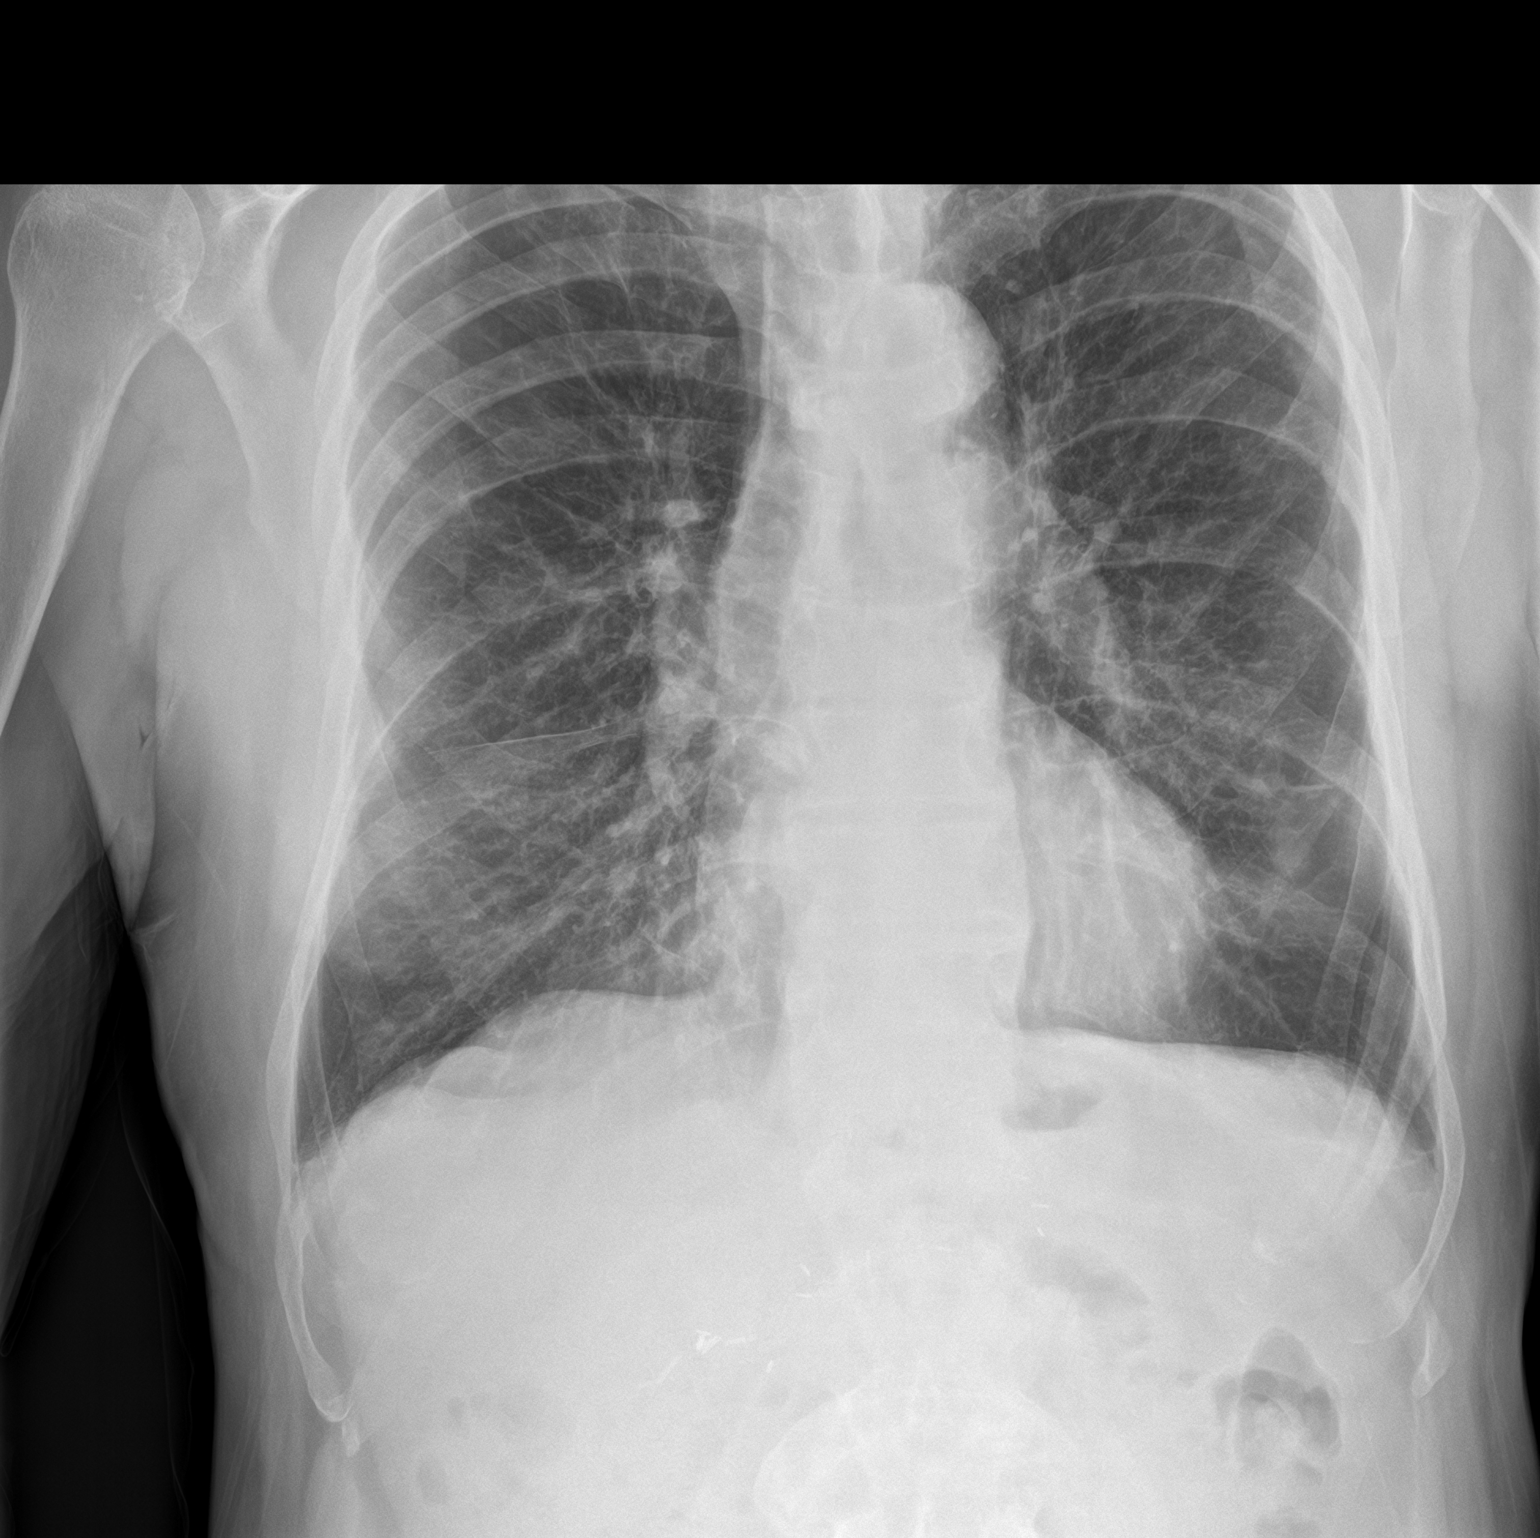

[chest lat]
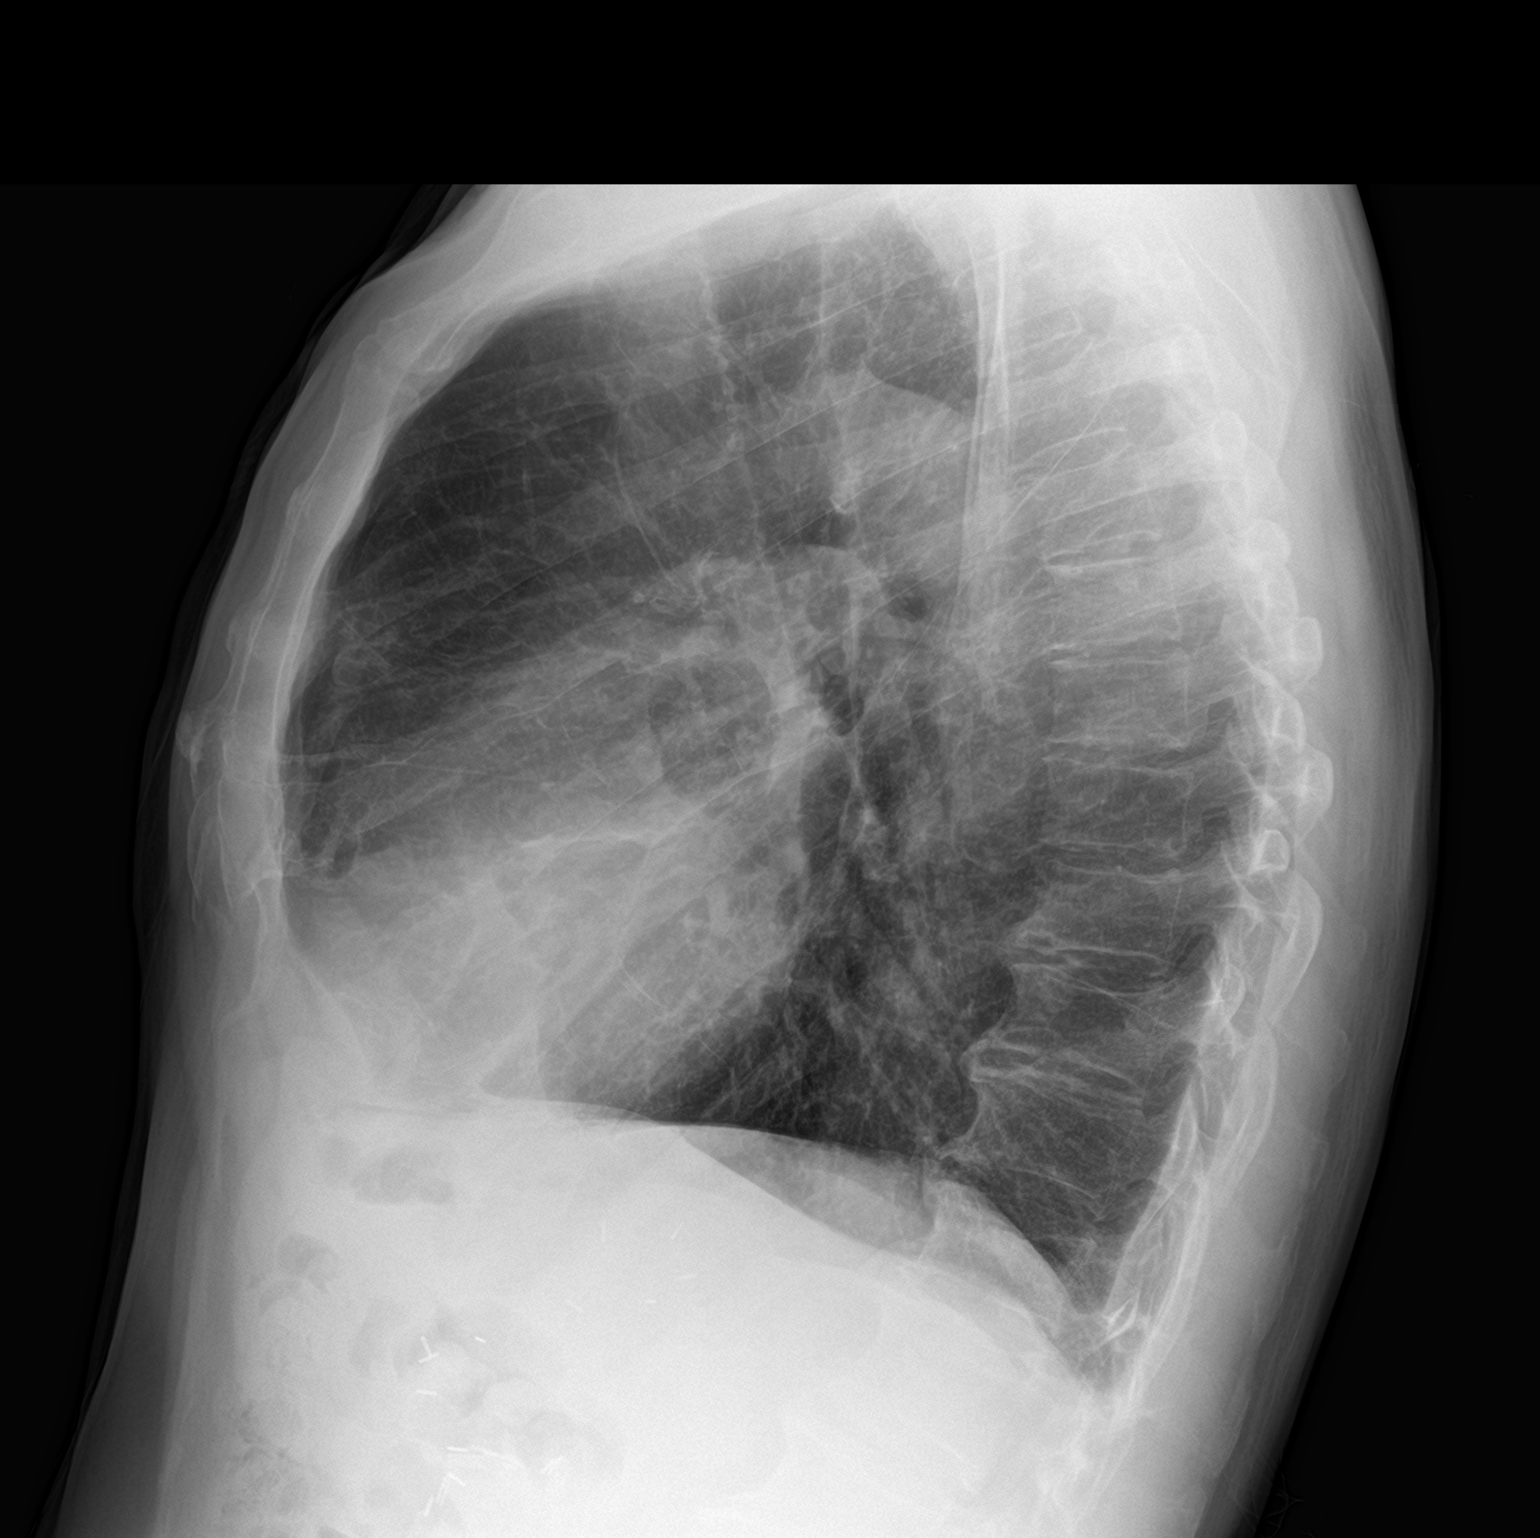

[2 of 2 positions shown; findings below may reference images not displayed]

FINDINGS: Frontal and lateral views of the chest demonstrate a stable cardiac
silhouette. The lungs are hyperinflated, with increased interstitial
prominence consistent with emphysema. No focal consolidation,
effusion, or pneumothorax. No acute bony abnormalities.
IMPRESSION: 1. Extensive background emphysema, with no superimposed airspace
disease.

## 2022-06-10 ENCOUNTER — Other Ambulatory Visit: Payer: Self-pay | Admitting: Internal Medicine

## 2022-06-10 DIAGNOSIS — K219 Gastro-esophageal reflux disease without esophagitis: Secondary | ICD-10-CM

## 2022-07-23 ENCOUNTER — Encounter (INDEPENDENT_AMBULATORY_CARE_PROVIDER_SITE_OTHER): Payer: Medicare Other | Admitting: Ophthalmology

## 2022-07-23 DIAGNOSIS — H353211 Exudative age-related macular degeneration, right eye, with active choroidal neovascularization: Secondary | ICD-10-CM | POA: Diagnosis not present

## 2022-07-23 DIAGNOSIS — H43813 Vitreous degeneration, bilateral: Secondary | ICD-10-CM

## 2022-07-23 DIAGNOSIS — H353122 Nonexudative age-related macular degeneration, left eye, intermediate dry stage: Secondary | ICD-10-CM

## 2022-09-23 ENCOUNTER — Other Ambulatory Visit: Payer: Self-pay | Admitting: Internal Medicine

## 2022-09-23 DIAGNOSIS — K219 Gastro-esophageal reflux disease without esophagitis: Secondary | ICD-10-CM

## 2022-10-29 ENCOUNTER — Encounter (INDEPENDENT_AMBULATORY_CARE_PROVIDER_SITE_OTHER): Payer: Medicare Other | Admitting: Ophthalmology

## 2022-10-29 DIAGNOSIS — H353211 Exudative age-related macular degeneration, right eye, with active choroidal neovascularization: Secondary | ICD-10-CM | POA: Diagnosis not present

## 2022-10-29 DIAGNOSIS — H353122 Nonexudative age-related macular degeneration, left eye, intermediate dry stage: Secondary | ICD-10-CM | POA: Diagnosis not present

## 2022-10-29 DIAGNOSIS — H43813 Vitreous degeneration, bilateral: Secondary | ICD-10-CM

## 2022-11-09 NOTE — Progress Notes (Unsigned)
Patrick Novak, male    DOB: [birth year],    MRN: [id number]   Brief patient profile:  29  yowm quit smoking 2001/MM due to gastric ca s/p chemo then bad spell of breathing > [name] admitted with pna dx emphysema and ever since then prn saba then [facility] added by Dr Juanetta Gosling but never back to baseline  main c/o has been variable cough > sob and self referred back to [place] pulmonary clinic    History of Present Illness  01/09/2020  Pulmonary/ 1st office eval/[place]/ Patrick Novak on ACEi and Breo  Chief Complaint  Patient presents with   Consult    Patient has COPD and has shortness of breath with exertion. Denies cough. Uses Virgel Bouquet is going to ask about switching  Dyspnea:  MMRC1 = can walk nl pace, flat grade, can't hurry or go uphills or steps s sob  Cough: variable cough when something stuck  Sleep: ok flat  SABA use: rarely  02 prn  rec Change prilosec to Take 30-60 min before first meal of the day > did not do  Stop breo - ok to restart if you are losing ground with breathing >  Did do Only use your albuterol as a rescue medication > about the same use  Make sure you check your oxygen saturations at highest level of activity to be sure it stays over 90%  And if it does you do not need oxygen> did not do  Stop lisinopril and start micardis (telmarsartan) 40 mg  One daily-  ok to break in half if too strong Please schedule a follow up office visit in 6 weeks with PFTs, call sooner if needed     11/06/2021  f/u ov/[place] office/Patrick Novak re: GOLD 3  maint on advair 100 bid   Chief Complaint  Patient presents with   Follow-up    Cough has improved.   Dyspnea:  can walk flat anywhere/ hills are a problem= MMRC2 = can't walk a nl pace on a flat grade s sob but does fine slow and flat   Cough: better now  Sleeping: flat bed / one pillow  SABA use: once a week  02: 2lpm hs and prn not checking  Covid status: vax x 3  infected summer 2022  Rec Make sure you check your oxygen saturation  AT   your highest level of activity (not after you stop)   to be sure it stays over 90%  No change in Advair    Admit date: 08/05/2022 Discharge date: 08/10/2022  Primary diagnosis: Sepsis and acute on chronic hypoxic respiratory failure secondary to bilateral lower lobe pneumonia, gram-positive bacilli on 1 of 2 blood cultures from 08/05/2022  Secondary diagnosis: Hypertension, gastroesophageal reflux disease  Hospital Course: Patient came to hospital due to shortness of breath found to have sepsis and acute on chronic hypoxic respiratory failure secondary to bilateral lower lobe pneumonia. Patient met SIRS criteria for sepsis with admission WBC count 12.4, respiratory rate 32. He required 6 L of oxygen keep oxygen saturation greater than 90%. Typically uses 3 L of oxygen keep oxygen saturation greater than 90%. D-dimer was elevated 0.90. CT angio pulmonary was negative for acute pulmonary emboli. Patient is given dexamethasone converted to prednisone, Breo Ellipta, Pulmicort, DuoNebs. Change ceftriaxone and azithromycin to vancomycin and cefepime on 08/07/2022 due to increasing WBC count from 12.4 up to 22.4 today 25.4. Allergy to amoxicillin, doxycycline, ciprofloxacin, sulfa antibiotics. 1 of 2 blood cultures on 08/05/2022 revealed gram-positive bacilli.  Hospital Course by Problem  Sepsis and acute on chronic hypoxic respiratory failure secondary to bilateral lower lobe pneumonia, gram-positive bacilli on 1 of 2 blood cultures from 08/05/2022 -Met SIRS criteria for sepsis with admission WBC count 12.4, respiratory rate 32 bpm -Patient required 6 L of oxygen keep oxygen saturation greater than 90% -Uses 3 L of oxygen at home -Chest x-ray on 08/05/2022 shows mild bilateral basilar densities are present, which may represent developing infiltrate or atelectasis -Negative COVID-19, influenza A/influenza B, RSV on 08/05/2022 -CT angio pulmonary on 08/08/2022 shows no filling defect is seen to suggest  pulmonary artery embolism. Nodular airspace opacity seen within the right hemothorax most pronounced at the right lower lobes. This can be seen with infection or developing nodule need follow-up to resolution. There is some peribronchial thickening with distal filling defect suggesting mucous plugs. Dexamethasone was transitioned over to prednisone 40 taper continued for 7-day course. Patient was transitioned to vancomycin and cefepime which was transitioned to Levaquin for 7 days. Patient's oxygen requirement decreased back to 3 L O2 and patient's respiratory status improved antibiotics and nebulizers. Patient symptomatically felt better and was able to ambulate around the room. He had recently tolerated Levaquin and was comfortable discharging with this home.  Hypertension -Continue candesartan  Gastroesophageal reflux disease -Continue Protonix      11/10/2022  post hosp  f/u ov/[place] office/Patrick Novak re: GOLD 3 copd/resp failure  maint on advair  not adherent to 02  Chief Complaint  Patient presents with   Follow-up    PT f/u states that he is doing "ok", he doesn't see benefit from the Advair inhaler and would like to go back to Anoro. Uses 2L O2 qhs   Dyspnea:  walking around yard s 02 x 15-20 min  with sats upper 80s Cough: none  Sleeping: flat bed/ one pillow ok  SABA use: 2-3 x while playing golf / neb  02: 2lpm hs and prn    No obvious day to day or daytime variability or assoc excess/ purulent sputum or mucus plugs or hemoptysis or cp or chest tightness, subjective wheeze or overt sinus or hb symptoms.   Sleeping as above without nocturnal  or early am exacerbation  of respiratory  c/o's or need for noct saba. Also denies any obvious fluctuation of symptoms with weather or environmental changes or other aggravating or alleviating factors except as outlined above   No unusual exposure hx or h/o childhood pna/ asthma or knowledge of premature birth.  Current Allergies, Complete  Past Medical History, Past Surgical History, Family History, and Social History were reviewed in [employer] record.  ROS  The following are not active complaints unless bolded Hoarseness, sore throat, dysphagia, dental problems, itching, sneezing,  nasal congestion or discharge of excess mucus or purulent secretions, ear ache,   fever, chills, sweats, unintended wt loss or wt gain, classically pleuritic or exertional cp,  orthopnea pnd or arm/hand swelling  or leg swelling, presyncope, palpitations, abdominal pain, anorexia, nausea, vomiting, diarrhea  or change in bowel habits or change in bladder habits, change in stools or change in urine, dysuria, hematuria,  rash, arthralgias, visual complaints, headache, numbness, weakness or ataxia or problems with walking or coordination,  change in mood or  memory.        Current Meds  Medication Sig   alendronate (FOSAMAX) 70 MG tablet Take 1 tablet (70 mg total) by mouth once a week.   BESIVANCE 0.6 % SUSP Apply to eye.  Calcium Carbonate-Vitamin D 600-400 MG-UNIT per tablet Take 1 tablet by mouth daily.    fluticasone-salmeterol (ADVAIR) 100-50 MCG/ACT AEPB INHALE 1 PUFF INTO THE LUNGS TWICE DAILY   levothyroxine (SYNTHROID) 25 MCG tablet Take 1 tablet (25 mcg total) by mouth daily before breakfast.   meclizine (ANTIVERT) 12.5 MG tablet Take by mouth.   meclizine (ANTIVERT) 12.5 MG tablet Take 1 tablet (12.5 mg total) by mouth 2 (two) times daily as needed.   meloxicam (MOBIC) 15 MG tablet Take 1 tablet by mouth once daily   Multiple Vitamin (MULTIVITAMIN) tablet Take 1 tablet by mouth daily.     Multiple Vitamins-Minerals (PRESERVISION AREDS PO) Take by mouth.   omeprazole (PRILOSEC) 20 MG capsule TAKE 1 CAPSULE BY MOUTH TWICE DAILY 30 TO 60 MINUTES BEFORE FIRST AND LAST MEAL OF THE DAY   ondansetron (ZOFRAN) 4 MG tablet Take 1 tablet (4 mg total) by mouth every 8 (eight) hours as needed for nausea or vomiting.   telmisartan  (MICARDIS) 40 MG tablet Take 1 tablet (40 mg total) by mouth daily.   Testosterone 1.62 % GEL APPLY 4 PUMPS TOPICALLY ONCE DAILY TO SHOULDER AREA   tiZANidine (ZANAFLEX) 2 MG tablet Take 1 tablet (2 mg total) by mouth 2 (two) times daily as needed for muscle spasms.             Past Medical History:  Diagnosis Date   Bradycardia    asympotmatic. Holter (2/11) with PACs, 1 run of 5 beats NSVT, HR range 41-125 with no pauses.    Cancer (HCC)    gastric lymphoma   COPD (chronic obstructive pulmonary disease) (HCC)    ED (erectile dysfunction)    Gastric ulcer    GERD (gastroesophageal reflux disease)    Hiatal hernia    History of stomach cancer    surgery in 2001   HTN (hypertension)    Low testosterone    MR (mitral regurgitation)    echo 2/11: EF 60%, no regional wall abnormalities, mild MR, mild to moderate RV dialation, mild to moderate RV dysfunction    Osteopenia    Vertigo    Vertigo    Vitamin D deficiency       Objective:    Wts   11/10/2022        162  11/06/2021        164 04/20/2021      167   04/11/2020    169   02/27/20 167 lb 12.8 oz (76.1 kg)  01/09/20 167 lb (75.8 kg)  09/13/19 168 lb (76.2 kg)     Vital signs reviewed  11/10/2022  - Note at rest 02 sats  86% on RA   General appearance:    slt hoarse pleasant wm nad  HEENT : Oropharynx  clear/ edentulous    NECK :  without  apparent JVD/ palpable Nodes/TM    LUNGS: no acc muscle use,  Mild barrel  contour chest wall with bilateral  Distant bs s audible wheeze and  without cough on insp or exp maneuvers  and mild  Hyperresonant  to  percussion bilaterally     CV:  RRR  no s3 or murmur or increase in P2, and no edema   ABD:  soft and nontender with pos end  insp Hoover's  in the supine position.  No bruits or organomegaly appreciated   MS:  Nl gait/ ext warm without deformities Or obvious joint restrictions  calf tenderness, cyanosis or clubbing  SKIN: warm and dry without lesions     NEURO:  alert, approp, nl sensorium with  no motor or cerebellar deficits apparent.     I personally reviewed   radiology impression as follows:  Chest CTa 08/08/22     No filling defect is seen to suggest a pulmonary artery embolism.  There are nodular airspace opacity seen within the right hemithorax most pronounced at the right lower lobe. These can be seen with infection or developing nodule need follow-up to resolution.  There is some peribronchial thickening with distal filling defects suggesting mucous plugs.   CXR PA and Lateral:   11/10/2022 :    I personally reviewed images and agree with radiology impression as follows:    Similar findings of lung hyperexpansion without superimposed acute cardiopulmonary disease. Specifically, no evidence of pneumonia.   Assessment

## 2022-11-10 ENCOUNTER — Ambulatory Visit (HOSPITAL_COMMUNITY)
Admission: RE | Admit: 2022-11-10 | Discharge: 2022-11-10 | Disposition: A | Discharge status: Home / Self Care | Payer: Medicare Other | Source: Ambulatory Visit | Attending: Internal Medicine | Admitting: Internal Medicine

## 2022-11-10 ENCOUNTER — Ambulatory Visit: Payer: Medicare Other | Admitting: Internal Medicine

## 2022-11-10 ENCOUNTER — Encounter: Payer: Self-pay | Admitting: Internal Medicine

## 2022-11-10 VITALS — BP 151/70 | HR 61 | Ht 70.0 in | Wt 162.6 lb

## 2022-11-10 DIAGNOSIS — J449 Chronic obstructive pulmonary disease, unspecified: Secondary | ICD-10-CM | POA: Diagnosis present

## 2022-11-10 DIAGNOSIS — J189 Pneumonia, unspecified organism: Secondary | ICD-10-CM

## 2022-11-10 DIAGNOSIS — J9611 Chronic respiratory failure with hypoxia: Secondary | ICD-10-CM | POA: Diagnosis not present

## 2022-11-10 MED ORDER — TRELEGY ELLIPTA 100-62.5-25 MCG/ACT IN AEPB
INHALATION_SPRAY | RESPIRATORY_TRACT | 11 refills | Status: DC
Start: 2022-11-10 — End: 2023-08-04

## 2022-11-10 MED ORDER — TRELEGY ELLIPTA 100-62.5-25 MCG/ACT IN AEPB: 1.0000 | INHALATION_SPRAY | Freq: Every day | RESPIRATORY_TRACT | 0 refills | Status: DC

## 2022-11-10 NOTE — Assessment & Plan Note (Signed)
Stopped smoking 2001 s resp symptoms/MM, later told had emphysema - try off breo and ACEi as of 01/09/2020 > just use saba short term  - alpha one phenotype  02/27/20    MM   Level 134 - PFT's  03/18/20  FEV1 1.44 (48 % ) ratio 0.44  p 11 % improvement from saba p 0 prior to study with DLCO  8.72 (34%) corrects to 1.68 (43%)  for alv volume and FV curve classic concavity/ air trapping present  -  11/06/2021   Walked on RA x  3  lap(s) =  approx 450  ft  @ mod to fast pace, stopped due to end of study s sob with lowest 02 sats 91%   - 11/10/2022 RA sats at rest 86% > referred for POC  - 11/10/2022  After extensive coaching inhaler device,  effectiveness =    90% with DPI/ elipta > rx trelegy 100 or Anoro whichever covered by insurance    Group D (now reclassified as E) in terms of symptom/risk and laba/lama/ICS  therefore appropriate rx at this point >>>  trelegy and approp saba:  Re SABA :  I spent extra time with pt today reviewing appropriate use of albuterol for prn use on exertion with the following points: 1) saba is for relief of sob that does not improve by walking a slower pace or resting but rather if the pt does not improve after trying this first. 2) If the pt is convinced, as many are, that saba helps recover from activity faster then it's easy to tell if this is the case by re-challenging : ie stop, take the inhaler, then p 5 minutes try the exact same activity (intensity of workload) that just caused the symptoms and see if they are substantially diminished or not after saba 3) if there is an activity that reproducibly causes the symptoms, try the saba 15 min before the activity on alternate days   If in fact the saba really does help, then fine to continue to use it prn but advised may need to look closer at the maintenance regimen being used to achieve better control of airways disease with exertion.

## 2022-11-10 NOTE — Patient Instructions (Addendum)
My office will be contacting you by phone for referral to [facility] for POC evaluation   - if you don't hear back from my office within one week please call us back or notify us thru MyChart and we'll address it right away.    Plan A = Automatic = Always=  stop advair and use   Trelegy or Anoro one click each am and then rinse your mouth   Plan B = Backup (to supplement plan A, not to replace it) Only use your albuterol inhaler as a rescue medication to be used if you can't catch your breath by resting or doing a relaxed purse lip breathing pattern.  - The less you use it, the better it will work when you need it. - Ok to use the inhaler up to 2 puffs  every 4 hours if you must but call for appointment if use goes up over your usual need - Don't leave home without it !!  (think of it like the spare tire for your car)   Plan C = Crisis (instead of Plan B but only if Plan B stops working) - only use your albuterol nebulizer if you first try Plan B and it fails to help > ok to use the nebulizer up to every 4 hours but if start needing it regularly call for immediate appointment  Please remember to go to the  x-ray department  @  [facility] for your tests - we will call you with the results when they are available     Please schedule a follow up visit in 3 months but call sooner if needed

## 2022-11-10 NOTE — Assessment & Plan Note (Addendum)
Rx since around  2018 [name] p pna   - 02/27/2020   Walked RA  approx   200 ft  @ avg pace  stopped due to desats to 88% at end of study though denies doe    - 04/20/2021   Walked on RA x  2  lap(s) =  approx 500 @ mod pace, stopped due to sob with sats down to 87%  with improvement to 94% on 2lpm  And completed another 250 ft s desats -  11/06/2021   Walked on RA x  3  lap(s) =  approx 450  ft  @ mod to fast pace, stopped due to end of study s sob with lowest 02 sats 91%   - admit [facility] 08/05/22 Bilateral Lower lobe - Sats at rest 11/10/2022 = 86% > referred for POC if eligible  Clearly worsened hypoxemia since severe pna with residual extensive lower lobe scarring on today' f/u cxr   Advised Make sure you check your oxygen saturation  AT  your highest level of activity (not after you stop)   to be sure it stays over 90% and adjust  02 flow upward to maintain this level if needed but remember to turn it back to previous settings when you stop (to conserve your supply).           Each maintenance medication was reviewed in detail including emphasizing most importantly the difference between maintenance and prns and under what circumstances the prns are to be triggered using an action plan format where appropriate.  Total time for H and P, chart review, counseling, reviewing dpi/hfa/neb  device(s) and generating customized AVS unique to this office visit / same day charting > 40 min post hosp eval

## 2022-11-15 DIAGNOSIS — J189 Pneumonia, unspecified organism: Secondary | ICD-10-CM | POA: Insufficient documentation

## 2022-11-15 NOTE — Assessment & Plan Note (Signed)
See admit 08/05/22 bilateral lower lobe with sepsis   Clinically resolved, radiographically improved with some scarring R > L base > no further rx needed         Each maintenance medication was reviewed in detail including emphasizing most importantly the difference between maintenance and prns and under what circumstances the prns are to be triggered using an action plan format where appropriate.  Total time for H and P, chart review, counseling, reviewing hfa/ neb/ dpi/02 device(s) and generating customized AVS unique to this office visit / same day charting  > 40 min post hosp f/u

## 2022-12-17 ENCOUNTER — Other Ambulatory Visit: Payer: Self-pay | Admitting: Internal Medicine

## 2022-12-17 DIAGNOSIS — K219 Gastro-esophageal reflux disease without esophagitis: Secondary | ICD-10-CM

## 2023-01-16 DIAGNOSIS — N182 Chronic kidney disease, stage 2 (mild): Secondary | ICD-10-CM | POA: Insufficient documentation

## 2023-02-11 ENCOUNTER — Encounter (INDEPENDENT_AMBULATORY_CARE_PROVIDER_SITE_OTHER): Payer: Medicare Other | Admitting: Ophthalmology

## 2023-02-11 DIAGNOSIS — H353122 Nonexudative age-related macular degeneration, left eye, intermediate dry stage: Secondary | ICD-10-CM | POA: Diagnosis not present

## 2023-02-11 DIAGNOSIS — H353211 Exudative age-related macular degeneration, right eye, with active choroidal neovascularization: Secondary | ICD-10-CM

## 2023-02-11 DIAGNOSIS — H43813 Vitreous degeneration, bilateral: Secondary | ICD-10-CM

## 2023-02-19 ENCOUNTER — Other Ambulatory Visit: Payer: Self-pay | Admitting: Family Medicine

## 2023-02-19 DIAGNOSIS — G8929 Other chronic pain: Secondary | ICD-10-CM

## 2023-03-01 ENCOUNTER — Other Ambulatory Visit: Payer: Self-pay | Admitting: Family Medicine

## 2023-03-01 DIAGNOSIS — G8929 Other chronic pain: Secondary | ICD-10-CM

## 2023-03-26 ENCOUNTER — Other Ambulatory Visit: Payer: Self-pay | Admitting: Internal Medicine

## 2023-03-26 DIAGNOSIS — K219 Gastro-esophageal reflux disease without esophagitis: Secondary | ICD-10-CM

## 2023-05-27 ENCOUNTER — Encounter (INDEPENDENT_AMBULATORY_CARE_PROVIDER_SITE_OTHER): Payer: Medicare Other | Admitting: Ophthalmology

## 2023-05-27 DIAGNOSIS — H43813 Vitreous degeneration, bilateral: Secondary | ICD-10-CM | POA: Diagnosis not present

## 2023-05-27 DIAGNOSIS — H353211 Exudative age-related macular degeneration, right eye, with active choroidal neovascularization: Secondary | ICD-10-CM | POA: Diagnosis not present

## 2023-05-27 DIAGNOSIS — H353122 Nonexudative age-related macular degeneration, left eye, intermediate dry stage: Secondary | ICD-10-CM

## 2023-07-28 ENCOUNTER — Telehealth: Payer: Self-pay | Admitting: Internal Medicine

## 2023-07-28 DIAGNOSIS — J9611 Chronic respiratory failure with hypoxia: Secondary | ICD-10-CM

## 2023-07-28 NOTE — Telephone Encounter (Signed)
 [facility] needs a prescription for oxygen  listing the liters per minute(within last 12 months), recent office visit note (within last 12 months) and any qualifying oxygen  testing(could be 56+years old doesn't matter). Fax number [phone]

## 2023-07-29 NOTE — Telephone Encounter (Signed)
 Called and left detail message for patient to call us  back to make an appt regarding his oxygen 

## 2023-07-29 NOTE — Telephone Encounter (Signed)
 Faxed notes [facility]

## 2023-08-01 NOTE — Telephone Encounter (Signed)
 Please try PT again.

## 2023-08-01 NOTE — Telephone Encounter (Signed)
 Per LOV 10/2022 patient was to have follow up visit in 01/2023.  Please contact patient to schedule. Thank you!

## 2023-08-03 NOTE — Progress Notes (Unsigned)
Patrick Novak, male    DOB: 01/27/1941,    MRN: [id number]   Brief patient profile:  88  yowm quit smoking 2001/MM due to gastric ca s/p chemo then bad spell of breathing > Patrick Novak admitted with pna dx emphysema and ever since then prn saba then [facility] added by Dr Patrick Novak but never back to baseline  main c/o has been variable cough > sob and self referred back to [facility] Novak pulmonary clinic    History of Present Illness  01/09/2020  Pulmonary/ 1st office eval/Patrick Novak/ Patrick Novak on ACEi and Breo  Chief Complaint  Patient presents with   Consult    Patient has COPD and has shortness of breath with exertion. Denies cough. Uses Virgel Bouquet is going to ask about switching  Dyspnea:  MMRC1 = can walk nl pace, flat grade, can't hurry or go uphills or steps s sob  Cough: variable cough when something stuck  Sleep: ok flat  SABA use: rarely  02 prn  rec Change prilosec to Take 30-60 min before first meal of the day > did not do  Stop breo - ok to restart if you are losing ground with breathing >  Did do Only use your albuterol as a rescue medication > about the same use  Make sure you check your oxygen saturations at highest level of activity to be sure it stays over 90%  And if it does you do not need oxygen> did not do  Stop lisinopril and start micardis (telmarsartan) 40 mg  One daily-  ok to break in half if too strong Please schedule a follow up office visit in 6 weeks with PFTs, call sooner if needed     11/06/2021  f/u ov/Patrick Novak office/Patrick Novak re: GOLD 3  maint on advair 100 bid   Chief Complaint  Patient presents with   Follow-up    Cough has improved.   Dyspnea:  can walk flat anywhere/ hills are a problem= MMRC2 = can't walk a nl pace on a flat grade s sob but does fine slow and flat   Cough: better now  Sleeping: flat bed / one pillow  SABA use: once a week  02: 2lpm hs and prn not checking  Covid status: vax x 3  infected summer 2022  Rec Make sure you check your oxygen saturation  AT   your highest level of activity (not after you stop)   to be sure it stays over 90%  No change in Advair    Admit date: 08/05/2022 Discharge date: 08/10/2022  Primary diagnosis: Sepsis and acute on chronic hypoxic respiratory failure secondary to bilateral lower lobe pneumonia, gram-positive bacilli on 1 of 2 blood cultures from 08/05/2022  Secondary diagnosis: Hypertension, gastroesophageal reflux disease  Hospital Course: Patient came to hospital due to shortness of breath found to have sepsis and acute on chronic hypoxic respiratory failure secondary to bilateral lower lobe pneumonia. Patient met SIRS criteria for sepsis with admission WBC count 12.4, respiratory rate 32. He required 6 L of oxygen keep oxygen saturation greater than 90%. Typically uses 3 L of oxygen keep oxygen saturation greater than 90%. D-dimer was elevated 0.90. CT angio pulmonary was negative for acute pulmonary emboli. Patient is given dexamethasone converted to prednisone, Breo Ellipta, Pulmicort, DuoNebs. Change ceftriaxone and azithromycin to vancomycin and cefepime on 08/07/2022 due to increasing WBC count from 12.4 up to 22.4 today 25.4. Allergy to amoxicillin, doxycycline, ciprofloxacin, sulfa antibiotics. 1 of 2 blood cultures on 08/05/2022 revealed gram-positive bacilli.  Hospital Course by Problem  Sepsis and acute on chronic hypoxic respiratory failure secondary to bilateral lower lobe pneumonia, gram-positive bacilli on 1 of 2 blood cultures from 08/05/2022 -Met SIRS criteria for sepsis with admission WBC count 12.4, respiratory rate 32 bpm -Patient required 6 L of oxygen keep oxygen saturation greater than 90% -Uses 3 L of oxygen at home -Chest x-ray on 08/05/2022 shows mild bilateral basilar densities are present, which may represent developing infiltrate or atelectasis -Negative COVID-19, influenza A/influenza B, RSV on 08/05/2022 -CT angio pulmonary on 08/08/2022 shows no filling defect is seen to suggest  pulmonary artery embolism. Nodular airspace opacity seen within the right hemothorax most pronounced at the right lower lobes. This can be seen with infection or developing nodule need follow-up to resolution. There is some peribronchial thickening with distal filling defect suggesting mucous plugs. Dexamethasone was transitioned over to prednisone 40 taper continued for 7-day course. Patient was transitioned to vancomycin and cefepime which was transitioned to Levaquin for 7 days. Patient's oxygen requirement decreased back to 3 L O2 and patient's respiratory status improved antibiotics and nebulizers. Patient symptomatically felt better and was able to ambulate around the room. He had recently tolerated Levaquin and was comfortable discharging with this home.  Hypertension -Continue candesartan  Gastroesophageal reflux disease -Continue Protonix      11/10/2022  post hosp  f/u ov/Patrick Novak office/Patrick Novak re: GOLD 3 copd/resp failure  maint on advair  not adherent to 02  Chief Complaint  Patient presents with   Follow-up    PT f/u states that he is doing "ok", he doesn't see benefit from the Advair inhaler and would like to go back to Anoro. Uses 2L O2 qhs   Dyspnea:  walking around yard s 02 x 15-20 min  with sats upper 80s Cough: none  Sleeping: flat bed/ one pillow ok  SABA use: 2-3 x while playing golf / neb  02: 2lpm hs and prn  Rec My office will be contacting you by phone for referral to Patrick Novak for POC evaluation   Plan A = Automatic = Always=  stop advair and use   Trelegy or Anoro one click each am and then rinse your mouth  Plan B = Backup (to supplement plan A, not to replace it) Only use your albuterol inhaler as a rescue medication   Plan C = Crisis (instead of Plan B but only if Plan B stops working) - only use your albuterol nebulizer if you first try Plan B     08/04/2023  f/u ov/Patrick Novak office/Patrick Novak re: GOLD 3  maint on trlelgy 100 not using 02 prn   Chief Complaint   Patient presents with   COPD  Dyspnea:  works in garden/ plays golf all s 02  Cough: rarely / Patrick mucus sporadic  Sleeping: bed is flat/ 1 pillow s  resp cc  SABA use: very rarely, almost always p exertion  02: 2lpm hs not using POC daytime nor is he monitoring sats     No obvious day to day or daytime variability or assoc excess/ purulent sputum or mucus plugs or hemoptysis or cp or chest tightness, subjective wheeze or overt sinus or hb symptoms.    Also denies any obvious fluctuation of symptoms with weather or environmental changes or other aggravating or alleviating factors except as outlined above   No unusual exposure hx or h/o childhood pna/ asthma or knowledge of premature birth.  Current Allergies, Complete Past Medical History, Past Surgical History, Family History,  and Social History were reviewed in [employer] record.  ROS  The following are not active complaints unless bolded Hoarseness/very mild, sore throat, dysphagia, dental problems, itching, sneezing,  nasal congestion or discharge of excess mucus or purulent secretions, ear ache,   fever, chills, sweats, unintended wt loss or wt gain, classically pleuritic or exertional cp,  orthopnea pnd or arm/hand swelling  or leg swelling, presyncope, palpitations, abdominal pain, anorexia, nausea, vomiting, diarrhea  or change in bowel habits or change in bladder habits, change in stools or change in urine, dysuria, hematuria,  rash, arthralgias, visual complaints, headache, numbness, weakness or ataxia or problems with walking or coordination,  change in mood or  memory.        Current Meds  Medication Sig   alendronate (FOSAMAX) 70 MG tablet Take 1 tablet (70 mg total) by mouth once a week.   BESIVANCE 0.6 % SUSP Apply to eye.   Calcium Carbonate-Vitamin D 600-400 MG-UNIT per tablet Take 1 tablet by mouth daily.    Fluticasone-Umeclidin-Vilant (TRELEGY ELLIPTA) 100-62.5-25 MCG/ACT AEPB Inhale 1 puff  into the lungs daily.   levothyroxine (SYNTHROID) 25 MCG tablet Take 1 tablet (25 mcg total) by mouth daily before breakfast.   meclizine (ANTIVERT) 12.5 MG tablet Take 1 tablet (12.5 mg total) by mouth 2 (two) times daily as needed.   meloxicam (MOBIC) 15 MG tablet Take 1 tablet by mouth once daily   Multiple Vitamin (MULTIVITAMIN) tablet Take 1 tablet by mouth daily.     Multiple Vitamins-Minerals (PRESERVISION AREDS PO) Take by mouth.   omeprazole (PRILOSEC) 20 MG capsule TAKE 1 CAPSULE BY MOUTH TWICE DAILY 30 TO 60 MINUTES BEFORE FIRST AND LAST MEAL OF THE DAY   ondansetron (ZOFRAN) 4 MG tablet Take 1 tablet (4 mg total) by mouth every 8 (eight) hours as needed for nausea or vomiting.   telmisartan (MICARDIS) 40 MG tablet Take 1 tablet (40 mg total) by mouth daily.   tiZANidine (ZANAFLEX) 2 MG tablet Take 1 tablet (2 mg total) by mouth 2 (two) times daily as needed for muscle spasms.             Past Medical History:  Diagnosis Date   Bradycardia    asympotmatic. Holter (2/11) with PACs, 1 run of 5 beats NSVT, HR range 41-125 with no pauses.    Cancer (HCC)    gastric lymphoma   COPD (chronic obstructive pulmonary disease) (HCC)    ED (erectile dysfunction)    Gastric ulcer    GERD (gastroesophageal reflux disease)    Hiatal hernia    History of stomach cancer    surgery in 2001   HTN (hypertension)    Low testosterone    MR (mitral regurgitation)    echo 2/11: EF 60%, no regional wall abnormalities, mild MR, mild to moderate RV dialation, mild to moderate RV dysfunction    Osteopenia    Vertigo    Vertigo    Vitamin D deficiency       Objective:    Wts  08/04/2023        161  11/10/2022        162  11/06/2021        164 04/20/2021      167   04/11/2020    169   02/27/20 167 lb 12.8 oz (76.1 kg)  01/09/20 167 lb (75.8 kg)  09/13/19 168 lb (76.2 kg)      Vital signs reviewed  08/04/2023  - Note  at rest 02 sats  92% on RA   General appearance:    very pleasant  minimally hoarse amb relatively  thin wm nad  HEENT : Oropharynx  clear/  edentulous   Nasal turbinates nl    NECK :  without  apparent JVD/ palpable Nodes/TM    LUNGS: no acc muscle use,  Mild barrel  contour chest wall with bilateral  Distant bs s audible wheeze and  without cough on insp or exp maneuvers  and mild  Hyperresonant  to  percussion bilaterally     CV:  RRR  no s3 or murmur or increase in P2, and no edema   ABD:  soft and nontender with pos end  insp Hoover's  in the supine position.  No bruits or organomegaly appreciated   MS:  Nl gait/ ext warm without deformities Or obvious joint restrictions  calf tenderness, cyanosis or clubbing     SKIN: warm and dry without lesions    NEURO:  alert, approp, nl sensorium with  no motor or cerebellar deficits apparent.     CXR PA and Lateral:   11/10/2022 :    I personally reviewed images and agree with radiology impression as follows:    Similar findings of lung hyperexpansion without superimposed acute cardiopulmonary disease. Specifically, no evidence of pneumonia.   Assessment

## 2023-08-04 ENCOUNTER — Ambulatory Visit: Payer: Medicare Other | Admitting: Internal Medicine

## 2023-08-04 ENCOUNTER — Encounter: Payer: Self-pay | Admitting: Internal Medicine

## 2023-08-04 VITALS — BP 137/61 | HR 72 | Ht 70.0 in | Wt 161.0 lb

## 2023-08-04 DIAGNOSIS — J9611 Chronic respiratory failure with hypoxia: Secondary | ICD-10-CM | POA: Diagnosis not present

## 2023-08-04 DIAGNOSIS — R058 Other specified cough: Secondary | ICD-10-CM | POA: Diagnosis not present

## 2023-08-04 DIAGNOSIS — J449 Chronic obstructive pulmonary disease, unspecified: Secondary | ICD-10-CM

## 2023-08-04 NOTE — Assessment & Plan Note (Signed)
Try off acei and on ppi 01/09/2020 > only stopped acei as of 02/27/2020 > rec bid ppi x 6 week trial plus diet  - 04/20/2021 rec consider trial off fosamax and on reclast   Very minimal despite continuing fosamax and Trelegy 100 > no change rx

## 2023-08-04 NOTE — Assessment & Plan Note (Signed)
Rx since around  2018 [name] p pna   - 02/27/2020   Walked RA  approx   200 ft  @ avg pace  stopped due to desats to 88% at end of study though denies doe    - 04/20/2021   Walked on RA x  2  lap(s) =  approx 500 @ mod pace, stopped due to sob with sats down to 87%  with improvement to 94% on 2lpm  And completed another 250 ft s desats -  11/06/2021   Walked on RA x  3  lap(s) =  approx 450  ft  @ mod to fast pace, stopped due to end of study s sob with lowest 02 sats 91%   - Sats at rest 11/10/2022 = 86% > referred for POC > received but not using consistently as of 08/04/2023  - 08/04/2023   Walked on RA  x  1  lap(s) =  approx 150  ft  @ slow pace, stopped due to desats with lowest 02 sats 81% but poor signal, no symptoms at all   I suspect he does desaturate with golf/ gardening and walking outside of home but so far has declined to use the POC as rec:  Make sure you check your oxygen saturation  AT  your highest level of activity (not after you stop)   to be sure it stays over 90% and adjust  02 flow upward to maintain this level if needed but remember to turn it back to previous settings when you stop (to conserve your supply).    F/u can be in 9 months then q year after that, call sooner if needed  Each maintenance medication was reviewed in detail including emphasizing most importantly the difference between maintenance and prns and under what circumstances the prns are to be triggered using an action plan format where appropriate.  Total time for H and P, chart review, counseling, reviewing hfa/ dpi/02 /pulse ox  device(s) , directly observing portions of ambulatory 02 saturation study/ and generating customized AVS unique to this office visit / same day charting = 

## 2023-08-04 NOTE — Assessment & Plan Note (Signed)
Stopped smoking 2001 s resp symptoms/MM, later told had emphysema - try off breo and ACEi as of 01/09/2020 > just use saba short term  - alpha one phenotype  02/27/20    MM   Level 134 - PFT's  03/18/20  FEV1 1.44 (48 % ) ratio 0.44  p 11 % improvement from saba p 0 prior to study with DLCO  8.72 (34%) corrects to 1.68 (43%)  for alv volume and FV curve classic concavity/ air trapping present  -  11/06/2021   Walked on RA x  3  lap(s) =  approx 450  ft  @ mod to fast pace, stopped due to end of study s sob with lowest 02 sats 91%   - 11/10/2022  After extensive coaching inhaler device,  effectiveness =    90% with DPI/ elipta > rx trelegy 100     Group D (now reclassified as E) in terms of symptom/risk and laba/lama/ICS  therefore appropriate rx at this point >>>  continue trelegy 100 but needs to use saba more appropriately:  Re SABA :  I spent extra time with pt today reviewing appropriate use of albuterol for prn use on exertion with the following points: 1) saba is for relief of sob that does not improve by walking a slower pace or resting but rather if the pt does not improve after trying this first. 2) If the pt is convinced, as many are, that saba helps recover from activity faster then it's easy to tell if this is the case by re-challenging : ie stop, take the inhaler, then p 5 minutes try the exact same activity (intensity of workload) that just caused the symptoms and see if they are substantially diminished or not after saba 3) if there is an activity that reproducibly causes the symptoms, try the saba 15 min before the activity on alternate days   If in fact the saba really does help, then fine to continue to use it prn but advised may need to look closer at the maintenance regimen being used to achieve better control of airways disease with exertion.

## 2023-08-04 NOTE — Patient Instructions (Addendum)
No change in medications   Make sure you check your oxygen saturation  AT  your highest level of activity (not after you stop)   to be sure it stays over 90% and adjust  02 flow upward to maintain this level if needed but remember to turn it back to previous settings when you stop (to conserve your supply).    Please schedule a follow up visit in 9  months but call sooner if needed

## 2023-08-22 NOTE — Telephone Encounter (Signed)
 Rubin Payor wife states patient needs order for oxygen Patient using [facility]. Home oxygen equipment is not working. Rubin Payor phone number is [phone].

## 2023-08-22 NOTE — Telephone Encounter (Signed)
 Order for 2L QHS placed with [facility].

## 2023-09-16 ENCOUNTER — Encounter (INDEPENDENT_AMBULATORY_CARE_PROVIDER_SITE_OTHER): Payer: Medicare Other | Admitting: Ophthalmology

## 2023-09-16 DIAGNOSIS — H353211 Exudative age-related macular degeneration, right eye, with active choroidal neovascularization: Secondary | ICD-10-CM

## 2023-09-16 DIAGNOSIS — H353122 Nonexudative age-related macular degeneration, left eye, intermediate dry stage: Secondary | ICD-10-CM

## 2023-09-16 DIAGNOSIS — H43813 Vitreous degeneration, bilateral: Secondary | ICD-10-CM | POA: Diagnosis not present

## 2023-12-02 ENCOUNTER — Encounter (INDEPENDENT_AMBULATORY_CARE_PROVIDER_SITE_OTHER): Admitting: Ophthalmology

## 2023-12-02 DIAGNOSIS — H353122 Nonexudative age-related macular degeneration, left eye, intermediate dry stage: Secondary | ICD-10-CM | POA: Diagnosis not present

## 2023-12-02 DIAGNOSIS — H43813 Vitreous degeneration, bilateral: Secondary | ICD-10-CM

## 2023-12-02 DIAGNOSIS — H353211 Exudative age-related macular degeneration, right eye, with active choroidal neovascularization: Secondary | ICD-10-CM | POA: Diagnosis not present

## 2023-12-26 ENCOUNTER — Other Ambulatory Visit: Payer: Self-pay | Admitting: Internal Medicine

## 2024-01-07 ENCOUNTER — Other Ambulatory Visit: Payer: Self-pay | Admitting: Internal Medicine

## 2024-01-07 DIAGNOSIS — K219 Gastro-esophageal reflux disease without esophagitis: Secondary | ICD-10-CM

## 2024-02-24 ENCOUNTER — Encounter (INDEPENDENT_AMBULATORY_CARE_PROVIDER_SITE_OTHER): Admitting: Ophthalmology

## 2024-02-24 DIAGNOSIS — H353122 Nonexudative age-related macular degeneration, left eye, intermediate dry stage: Secondary | ICD-10-CM

## 2024-02-24 DIAGNOSIS — H43813 Vitreous degeneration, bilateral: Secondary | ICD-10-CM | POA: Diagnosis not present

## 2024-02-24 DIAGNOSIS — H353211 Exudative age-related macular degeneration, right eye, with active choroidal neovascularization: Secondary | ICD-10-CM | POA: Diagnosis not present

## 2024-04-02 ENCOUNTER — Other Ambulatory Visit: Payer: Self-pay

## 2024-04-02 ENCOUNTER — Other Ambulatory Visit: Payer: Self-pay | Admitting: Internal Medicine

## 2024-04-02 DIAGNOSIS — K219 Gastro-esophageal reflux disease without esophagitis: Secondary | ICD-10-CM

## 2024-04-30 ENCOUNTER — Ambulatory Visit: Admitting: Internal Medicine

## 2024-04-30 ENCOUNTER — Encounter: Payer: Self-pay | Admitting: Internal Medicine

## 2024-04-30 VITALS — BP 134/65 | HR 70 | Ht 70.0 in | Wt 161.8 lb

## 2024-04-30 DIAGNOSIS — J449 Chronic obstructive pulmonary disease, unspecified: Secondary | ICD-10-CM

## 2024-04-30 DIAGNOSIS — R058 Other specified cough: Secondary | ICD-10-CM

## 2024-04-30 DIAGNOSIS — J9611 Chronic respiratory failure with hypoxia: Secondary | ICD-10-CM | POA: Diagnosis not present

## 2024-04-30 NOTE — Assessment & Plan Note (Addendum)
 Rx since around  2018 [name] pna   - 02/27/2020   Walked RA  approx   200 ft  @ avg pace  stopped due to desats to 88% at end of study though denies doe    - 04/20/2021   Walked on RA x  2  lap(s) =  approx 500 @ mod pace, stopped due to sob with sats down to 87%  with improvement to 94% on 2lpm  And completed another 250 ft s desats -  11/06/2021   Walked on RA x  3  lap(s) =  approx 450  ft  @ mod to fast pace, stopped due to end of study s sob with lowest 02 sats 91%   - Sats at rest 11/10/2022 = 86% > referred for POC > received but not using consistently as of 08/04/2023  - 08/04/2023   Walked on RA  x  1  lap(s) =  approx 150  ft  @ slow pace, stopped due to desats with lowest 02 sats 81% but poor signal, no symptoms at all  - 04/30/2024   Walked on RA  x  3  lap(s) =  approx 450  ft  @ slow to mod pace, stopped due to end of study  with lowest 02 sats 94% no sob    Advised re appop use of POC if sats less than 90% with extended walking   F/u yearly approp at this point.

## 2024-04-30 NOTE — Progress Notes (Addendum)
 Patrick Novak, male    DOB: [birth year],    MRN: [id number]   Brief patient profile:  79  yowm quit smoking 2001/MM due to gastric ca s/p chemo then bad spell of breathing > [name] admitted with pna dx emphysema and ever since then prn saba then Breo added by Dr Vonzell but never back to baseline  main c/o has been variable cough > sob and self referred back to [name] [facility]    History of Present Illness  01/09/2020  Pulmonary/ 1st office eval/[facility]/ Kweli Grassel on ACEi and Breo  Chief Complaint  Patient presents with   Consult    Patient has COPD and has shortness of breath with exertion. Denies cough. Uses [facility] is going to ask about switching  Dyspnea:  MMRC1 = can walk nl pace, flat grade, can't hurry or go uphills or steps s sob  Cough: variable cough when something stuck  Sleep: ok flat  SABA use: rarely  02 prn  rec Change prilosec to Take 30-60 min before first meal of the day > did not do  Stop breo - ok to restart if you are losing ground with breathing >  Did do Only use your albuterol  as a rescue medication > about the same use  Make sure you check your oxygen  saturations at highest level of activity to be sure it stays over 90%  And if it does you do not need oxygen > did not do  Stop lisinopril  and start micardis  (telmarsartan) 40 mg  One daily-  ok to break in half if too strong Please schedule a follow up office visit in 6 weeks with PFTs, call sooner if needed       11/10/2022  post hosp  f/u ov/[place] office/Margeart Allender re: GOLD 3 copd/resp failure  maint on advair  not adherent to 02  Chief Complaint  Patient presents with   Follow-up    PT f/u states that he is doing ok, he doesn't see benefit from the Advair inhaler and would like to go back to Anoro. Uses 2L O2 qhs   Dyspnea:  walking around yard s 02 x 15-20 min  with sats upper 80s Cough: none  Sleeping: flat bed/ one pillow ok  SABA use: 2-3 x while playing golf / neb  02: 2lpm hs and prn  Rec My  office will be contacting you by phone for referral to [facility] for POC evaluation   Plan A = Automatic = Always=  stop advair and use   Trelegy or Anoro one click each am and then rinse your mouth  Plan B = Backup (to supplement plan A, not to replace it) Only use your albuterol  inhaler as a rescue medication   Plan C = Crisis (instead of Plan B but only if Plan B stops working) - only use your albuterol  nebulizer if you first try Plan B     08/04/2023  f/u ov/[place] office/Lorine Iannaccone re: COPD GOLD 3  maint on trlelgy 100 not using 02 prn   Chief Complaint  Patient presents with   COPD  Dyspnea:  works in garden/ plays golf all s 02  Cough: rarely / white mucus sporadic  Sleeping: bed is flat/ 1 pillow s  resp cc  SABA use: very rarely, almost always p exertion  02: 2lpm hs not using POC daytime nor is he monitoring sats Rec No change in medications  Make sure you check your oxygen  saturation  AT  your highest level of activity (  not after you stop)   to be sure it stays over 90%       04/30/2024  f/u ov/[place] office/Baili Stang re: COPD GOLD 3 on 02 hs and prn daytime  maint on trelegy 100 most days   Chief Complaint  Patient presents with   COPD    [place] f/u  Dyspnea:  flat to mailbox and back s stopping x 100 ft each way / flat  Cough: better now/ flares with sinuses (p bush hog for example)  Sleeping: bed is flat one pillow and wedge s    resp cc  SABA use: none  02: 2lpm at hs  - not using daytime POC  but does take it with him    No obvious day to day or daytime variability or assoc excess/ purulent sputum or mucus plugs or hemoptysis or cp or chest tightness, subjective wheeze or overt sinus or hb symptoms.    Also denies any obvious fluctuation of symptoms with weather or environmental changes or other aggravating or alleviating factors except as outlined above   No unusual exposure hx or h/o childhood pna/ asthma or knowledge of premature birth.  Current Allergies, Complete  Past Medical History, Past Surgical History, Family History, and Social History were reviewed in [employer] record.  ROS  The following are not active complaints unless bolded Hoarseness, sore throat, dysphagia, dental problems, itching, sneezing,  nasal congestion or discharge of excess mucus or purulent secretions, ear ache,   fever, chills, sweats, unintended wt loss or wt gain, classically pleuritic or exertional cp,  orthopnea pnd or arm/hand swelling  or leg swelling, presyncope, palpitations, abdominal pain, anorexia, nausea, vomiting, diarrhea  or change in bowel habits or change in bladder habits, change in stools or change in urine, dysuria, hematuria,  rash, arthralgias, visual complaints, headache, numbness, weakness or ataxia or problems with walking or coordination,  change in mood or  memory.        Current Meds  Medication Sig   BESIVANCE 0.6 % SUSP Apply to eye.   Calcium Carbonate-Vitamin D  600-400 MG-UNIT per tablet Take 1 tablet by mouth daily.    Fluticasone -Umeclidin-Vilant (TRELEGY ELLIPTA ) 100-62.5-25 MCG/ACT AEPB INHALE 1 PUFF INTO LUNGS IN THE MORNING   meclizine  (ANTIVERT ) 12.5 MG tablet Take 1 tablet (12.5 mg total) by mouth 2 (two) times daily as needed.   meloxicam  (MOBIC ) 15 MG tablet Take 1 tablet by mouth once daily   Multiple Vitamin (MULTIVITAMIN) tablet Take 1 tablet by mouth daily.     Multiple Vitamins-Minerals (PRESERVISION AREDS PO) Take by mouth.   omeprazole  (PRILOSEC) 20 MG capsule TAKE 1 CAPSULE BY MOUTH TWICE DAILY 30-60 MINUTES BEFORE THE FIRST AND LAST MEAL OF THE DAY   ondansetron  (ZOFRAN ) 4 MG tablet Take 1 tablet (4 mg total) by mouth every 8 (eight) hours as needed for nausea or vomiting.   telmisartan  (MICARDIS ) 40 MG tablet Take 1 tablet (40 mg total) by mouth daily.              Past Medical History:  Diagnosis Date   Bradycardia    asympotmatic. Holter (2/11) with PACs, 1 run of 5 beats NSVT, HR range 41-125  with no pauses.    Cancer [facility])    gastric lymphoma   COPD (chronic obstructive pulmonary disease) (HCC)    ED (erectile dysfunction)    Gastric ulcer    GERD (gastroesophageal reflux disease)    Hiatal hernia    History of stomach cancer  surgery in 2001   HTN (hypertension)    Low testosterone     MR (mitral regurgitation)    echo 2/11: EF 60%, no regional wall abnormalities, mild MR, mild to moderate RV dialation, mild to moderate RV dysfunction    Osteopenia    Vertigo    Vertigo    Vitamin D  deficiency       Objective:    Wts  04/30/2024      161 08/04/2023        161  11/10/2022        162  11/06/2021        164 04/20/2021      167   04/11/2020    169   02/27/20 167 lb 12.8 oz (76.1 kg)  01/09/20 167 lb (75.8 kg)  09/13/19 168 lb (76.2 kg)     Vital signs reviewed  04/30/2024  - Note at rest 02 sats  91% on RA   General appearance:    amb elderly wm nad   HEENT : Oropharynx  clear/ edentulous   Nasal turbinates nl    NECK :  without  apparent JVD/ palpable Nodes/TM    LUNGS: no acc muscle use,  Mild barrel  contour chest wall with bilateral  Distant bs s audible wheeze and  without cough on insp or exp maneuvers  and mild  Hyperresonant  to  percussion bilaterally     CV:  RRR  no s3 or murmur or increase in P2, and no edema   ABD:  soft and nontender   MS:  Nl gait/ ext warm without deformities Or obvious joint restrictions  calf tenderness, cyanosis or clubbing     SKIN: warm and dry without lesions    NEURO:  alert, approp, nl sensorium with  no motor or cerebellar deficits apparent.     Assessment   Assessment & Plan Upper airway cough syndrome  Chronic respiratory failure with hypoxia (HCC) Rx since around  2018 [name] p pna   - 02/27/2020   Walked RA  approx   200 ft  @ avg pace  stopped due to desats to 88% at end of study though denies doe    - 04/20/2021   Walked on RA x  2  lap(s) =  approx 500 @ mod pace, stopped due to sob with sats down  to 87%  with improvement to 94% on 2lpm  And completed another 250 ft s desats -  11/06/2021   Walked on RA x  3  lap(s) =  approx 450  ft  @ mod to fast pace, stopped due to end of study s sob with lowest 02 sats 91%   - Sats at rest 11/10/2022 = 86% > referred for POC > received but not using consistently as of 08/04/2023  - 08/04/2023   Walked on RA  x  1  lap(s) =  approx 150  ft  @ slow pace, stopped due to desats with lowest 02 sats 81% but poor signal, no symptoms at all  - 04/30/2024   Walked on RA  x  3  lap(s) =  approx 450  ft  @ slow to mod pace, stopped due to end of study  with lowest 02 sats 94% no sob    Advised re appop use of POC if sats less than 90% with extended walking   F/u yearly approp at this point. COPD GOLD 3 / min reversibility  Stopped smoking 2001 s resp symptoms/MM, later  told had emphysema - try off breo and ACEi as of 01/09/2020 > just use saba short term  - alpha one phenotype  02/27/20    MM   Level 134 - PFT's  03/18/20  FEV1 1.44 (48 % ) ratio 0.44  p 11 % improvement from saba p 0 prior to study with DLCO  8.72 (34%) corrects to 1.68 (43%)  for alv volume and FV curve classic concavity/ air trapping present  -  11/06/2021   Walked on RA x  3  lap(s) =  approx 450  ft  @ mod to fast pace, stopped due to end of study s sob with lowest 02 sats 91%   - 11/10/2022  After extensive coaching inhaler device,  effectiveness =    90% with DPI/ elipta > rx trelegy 100       Group E in terms of symptoms/risk so  laba/lama/ICS  therefore appropriate rx at this point >>>  trelegy 100   and approp 02 (see resp failure a/p)  Each maintenance medication was reviewed in detail including emphasizing most importantly the difference between maintenance and prns and under what circumstances the prns are to be triggered using an action plan format where appropriate.  Total time for H and P, chart review, counseling, reviewing dpi/pulse ox/ P)C device(s) , directly observing portions of  ambulatory 02 saturation study/ and generating customized AVS unique to this office visit / same day charting = 26 min                    AVS  Patient Instructions  No change in medications   Make sure you check your oxygen  saturation  AT  your highest level of activity (not after you stop)   to be sure it stays over 90% and adjust  02 flow upward to maintain this level if needed but remember to turn it back to previous settings when you stop (to conserve your supply).   Please schedule a follow up visit in 12 months but call sooner if needed    Ozell America, MD 04/30/2024

## 2024-04-30 NOTE — Patient Instructions (Signed)
No change in medications  Make sure you check your oxygen saturation  AT  your highest level of activity (not after you stop)   to be sure it stays over 90% and adjust  02 flow upward to maintain this level if needed but remember to turn it back to previous settings when you stop (to conserve your supply).   Please schedule a follow up visit in 12 months but call sooner if needed

## 2024-04-30 NOTE — Assessment & Plan Note (Addendum)
 Stopped smoking 2001 s resp symptoms/MM, later told had emphysema - try off breo and ACEi as of 01/09/2020 > just use saba short term  - alpha one phenotype  02/27/20    MM   Level 134 - PFT's  03/18/20  FEV1 1.44 (48 % ) ratio 0.44  p 11 % improvement from saba p 0 prior to study with DLCO  8.72 (34%) corrects to 1.68 (43%)  for alv volume and FV curve classic concavity/ air trapping present  -  11/06/2021   Walked on RA x  3  lap(s) =  approx 450  ft  @ mod to fast pace, stopped due to end of study s sob with lowest 02 sats 91%   - 11/10/2022  After extensive coaching inhaler device,  effectiveness =    90% with DPI/ elipta > rx trelegy 100       Group E in terms of symptoms/risk so  laba/lama/ICS  therefore appropriate rx at this point >>>  trelegy 100   and approp 02 (see resp failure a/p)  Each maintenance medication was reviewed in detail including emphasizing most importantly the difference between maintenance and prns and under what circumstances the prns are to be triggered using an action plan format where appropriate.  Total time for H and P, chart review, counseling, reviewing dpi/pulse ox/ P)C device(s) , directly observing portions of ambulatory 02 saturation study/ and generating customized AVS unique to this office visit / same day charting = 26 min

## 2024-05-25 ENCOUNTER — Encounter (INDEPENDENT_AMBULATORY_CARE_PROVIDER_SITE_OTHER): Admitting: Ophthalmology

## 2024-05-25 DIAGNOSIS — H353211 Exudative age-related macular degeneration, right eye, with active choroidal neovascularization: Secondary | ICD-10-CM | POA: Diagnosis not present

## 2024-05-25 DIAGNOSIS — H43813 Vitreous degeneration, bilateral: Secondary | ICD-10-CM

## 2024-05-25 DIAGNOSIS — H353122 Nonexudative age-related macular degeneration, left eye, intermediate dry stage: Secondary | ICD-10-CM | POA: Diagnosis not present

## 2024-07-14 ENCOUNTER — Other Ambulatory Visit: Payer: Self-pay | Admitting: Internal Medicine

## 2024-07-14 DIAGNOSIS — K219 Gastro-esophageal reflux disease without esophagitis: Secondary | ICD-10-CM

## 2024-07-23 ENCOUNTER — Other Ambulatory Visit: Payer: Self-pay | Admitting: Internal Medicine

## 2024-07-23 DIAGNOSIS — K219 Gastro-esophageal reflux disease without esophagitis: Secondary | ICD-10-CM

## 2024-08-30 ENCOUNTER — Encounter (INDEPENDENT_AMBULATORY_CARE_PROVIDER_SITE_OTHER): Admitting: Ophthalmology
